# Patient Record
Sex: Male | Born: 1985 | Race: Black or African American | Hispanic: No | Marital: Single | Smoking: Current every day smoker
Health system: Southern US, Community
[De-identification: ages and names within clinical notes are randomized; demographics above are authoritative.]

## PROBLEM LIST (undated history)

## (undated) DIAGNOSIS — J45909 Unspecified asthma, uncomplicated: Secondary | ICD-10-CM

## (undated) DIAGNOSIS — R569 Unspecified convulsions: Secondary | ICD-10-CM

## (undated) DIAGNOSIS — F819 Developmental disorder of scholastic skills, unspecified: Secondary | ICD-10-CM

---

## 2004-04-08 ENCOUNTER — Emergency Department: Payer: Self-pay | Admitting: General Practice

## 2004-06-18 ENCOUNTER — Emergency Department: Payer: Self-pay | Admitting: Emergency Medicine

## 2004-10-05 ENCOUNTER — Emergency Department: Payer: Self-pay | Admitting: General Practice

## 2004-12-28 ENCOUNTER — Emergency Department: Payer: Self-pay | Admitting: Emergency Medicine

## 2005-02-04 ENCOUNTER — Emergency Department: Payer: Self-pay | Admitting: Emergency Medicine

## 2005-05-16 ENCOUNTER — Emergency Department: Payer: Self-pay | Admitting: Internal Medicine

## 2005-05-17 ENCOUNTER — Emergency Department: Payer: Self-pay | Admitting: Internal Medicine

## 2005-05-22 ENCOUNTER — Emergency Department: Payer: Self-pay | Admitting: Emergency Medicine

## 2005-06-23 ENCOUNTER — Emergency Department: Payer: Self-pay | Admitting: Emergency Medicine

## 2005-07-26 ENCOUNTER — Emergency Department: Payer: Self-pay | Admitting: Internal Medicine

## 2005-09-12 ENCOUNTER — Emergency Department: Payer: Self-pay | Admitting: Emergency Medicine

## 2005-11-02 ENCOUNTER — Emergency Department: Payer: Self-pay | Admitting: Emergency Medicine

## 2006-03-02 ENCOUNTER — Emergency Department: Payer: Self-pay | Admitting: General Practice

## 2006-08-13 ENCOUNTER — Emergency Department: Payer: Self-pay

## 2006-09-15 ENCOUNTER — Emergency Department: Payer: Self-pay | Admitting: General Practice

## 2006-12-23 ENCOUNTER — Emergency Department: Payer: Self-pay | Admitting: Emergency Medicine

## 2007-03-05 ENCOUNTER — Emergency Department: Payer: Self-pay | Admitting: Unknown Physician Specialty

## 2007-07-13 ENCOUNTER — Emergency Department: Payer: Self-pay | Admitting: Emergency Medicine

## 2007-09-22 ENCOUNTER — Emergency Department: Payer: Self-pay | Admitting: Emergency Medicine

## 2007-09-30 ENCOUNTER — Emergency Department: Payer: Self-pay | Admitting: Emergency Medicine

## 2007-11-10 ENCOUNTER — Emergency Department: Payer: Self-pay | Admitting: Emergency Medicine

## 2007-12-07 ENCOUNTER — Emergency Department: Payer: Self-pay | Admitting: Unknown Physician Specialty

## 2008-03-04 ENCOUNTER — Emergency Department: Payer: Self-pay | Admitting: Emergency Medicine

## 2008-04-01 ENCOUNTER — Emergency Department: Payer: Self-pay | Admitting: Internal Medicine

## 2008-04-25 ENCOUNTER — Emergency Department: Payer: Self-pay | Admitting: Emergency Medicine

## 2008-05-31 ENCOUNTER — Emergency Department: Payer: Self-pay | Admitting: Unknown Physician Specialty

## 2008-10-21 ENCOUNTER — Emergency Department: Payer: Self-pay | Admitting: Unknown Physician Specialty

## 2008-10-31 ENCOUNTER — Emergency Department: Payer: Self-pay | Admitting: Emergency Medicine

## 2008-11-03 ENCOUNTER — Emergency Department: Payer: Self-pay | Admitting: Emergency Medicine

## 2008-11-22 ENCOUNTER — Emergency Department: Payer: Self-pay | Admitting: Emergency Medicine

## 2009-01-06 ENCOUNTER — Emergency Department: Payer: Self-pay | Admitting: Emergency Medicine

## 2009-02-02 ENCOUNTER — Emergency Department: Payer: Self-pay | Admitting: Emergency Medicine

## 2009-03-05 ENCOUNTER — Emergency Department: Payer: Self-pay | Admitting: Unknown Physician Specialty

## 2009-04-05 ENCOUNTER — Emergency Department: Payer: Self-pay | Admitting: Emergency Medicine

## 2009-04-21 ENCOUNTER — Emergency Department: Payer: Self-pay | Admitting: Emergency Medicine

## 2009-05-01 ENCOUNTER — Emergency Department: Payer: Self-pay | Admitting: Emergency Medicine

## 2009-05-19 ENCOUNTER — Emergency Department: Payer: Self-pay | Admitting: Emergency Medicine

## 2009-06-21 ENCOUNTER — Emergency Department: Payer: Self-pay | Admitting: Emergency Medicine

## 2009-06-22 ENCOUNTER — Emergency Department: Payer: Self-pay | Admitting: Unknown Physician Specialty

## 2009-07-18 ENCOUNTER — Emergency Department: Payer: Self-pay | Admitting: Emergency Medicine

## 2009-08-31 ENCOUNTER — Emergency Department: Payer: Self-pay | Admitting: Emergency Medicine

## 2009-09-04 ENCOUNTER — Inpatient Hospital Stay: Payer: Self-pay | Admitting: Psychiatry

## 2009-10-24 ENCOUNTER — Emergency Department: Payer: Self-pay | Admitting: Emergency Medicine

## 2009-12-12 ENCOUNTER — Emergency Department: Payer: Self-pay | Admitting: Emergency Medicine

## 2010-02-10 ENCOUNTER — Emergency Department: Payer: Self-pay | Admitting: Unknown Physician Specialty

## 2010-05-01 ENCOUNTER — Emergency Department: Payer: Self-pay | Admitting: Unknown Physician Specialty

## 2010-05-07 ENCOUNTER — Emergency Department: Payer: Self-pay | Admitting: Internal Medicine

## 2010-05-16 ENCOUNTER — Emergency Department: Payer: Self-pay | Admitting: Emergency Medicine

## 2010-05-29 ENCOUNTER — Emergency Department: Payer: Self-pay | Admitting: Emergency Medicine

## 2010-06-07 ENCOUNTER — Emergency Department: Payer: Self-pay | Admitting: Emergency Medicine

## 2010-09-28 ENCOUNTER — Emergency Department: Payer: Self-pay | Admitting: Emergency Medicine

## 2010-11-27 ENCOUNTER — Emergency Department: Payer: Self-pay | Admitting: Emergency Medicine

## 2011-03-12 IMAGING — CT CT HEAD WITHOUT CONTRAST
2 of 5 series · 16 of 30 positions shown, 19 images · non-contrast
Comparison: none

REASON FOR EXAM: seizure  hit head
COMMENTS:

PROCEDURE:     CT  - CT HEAD WITHOUT CONTRAST  - May 29, 2010  [DATE]
RESULT:     Comparison:  None
TECHNIQUE: Multiple axial images from the foramen magnum to the vertex were
obtained without IV contrast.

[Series 2: without · axial · non-contrast · 0.38mm/px · z∈[+154,+274]mm · 9 of 30 slices shown, 12 images]
[im 3/30  brain]
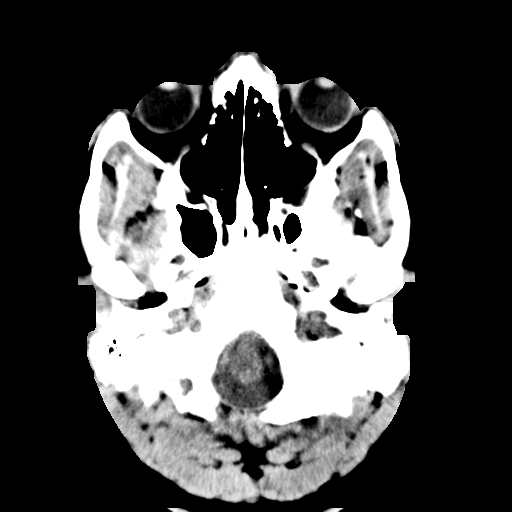
[im 3/30  bone]
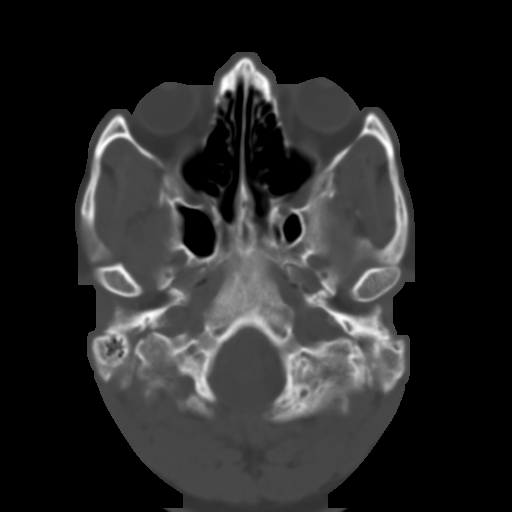
[im 6/30  brain]
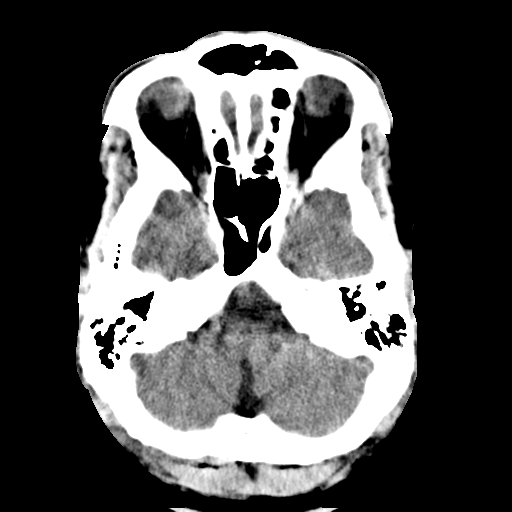
[im 9/30  brain]
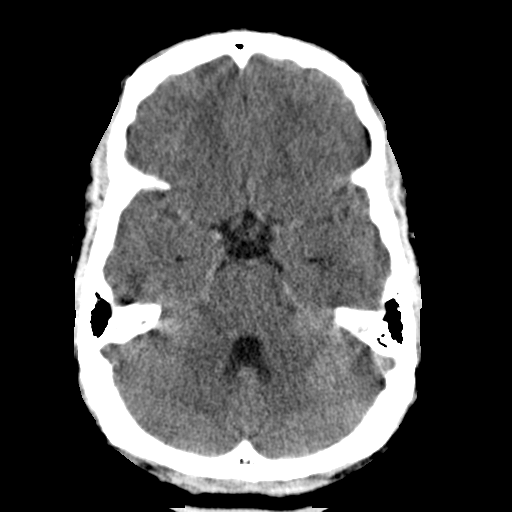
[im 12/30  brain]
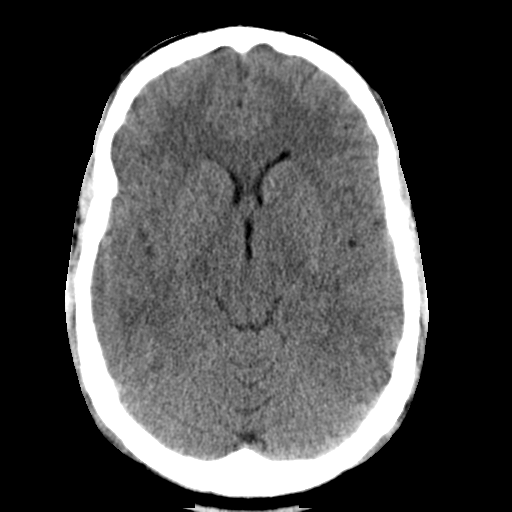
[im 15/30  brain]
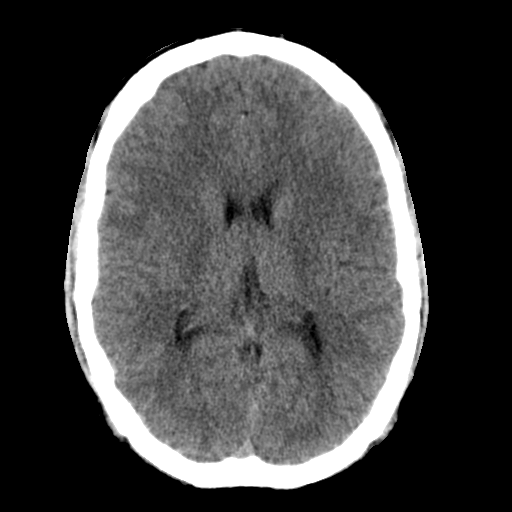
[im 15/30  bone]
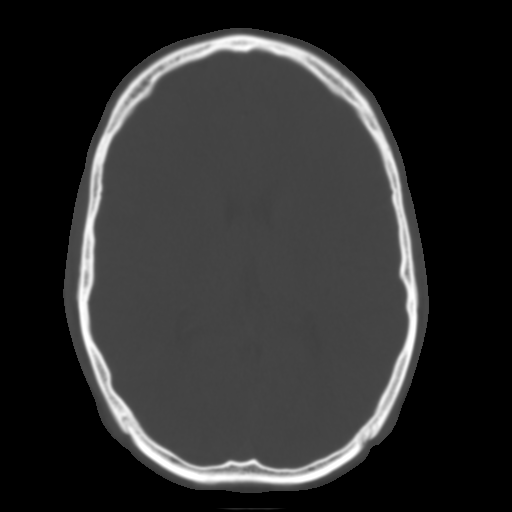
[im 18/30  brain]
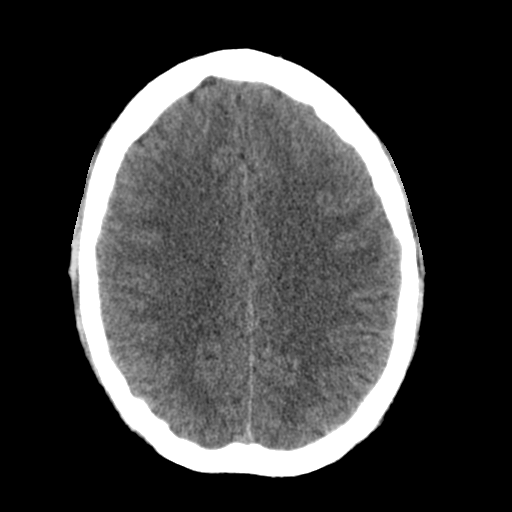
[im 21/30  brain]
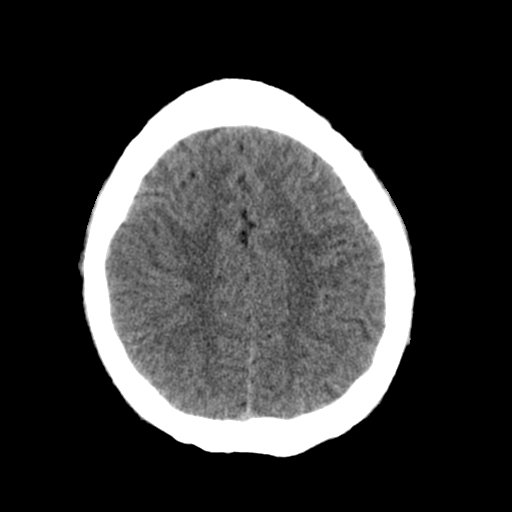
[im 24/30  brain]
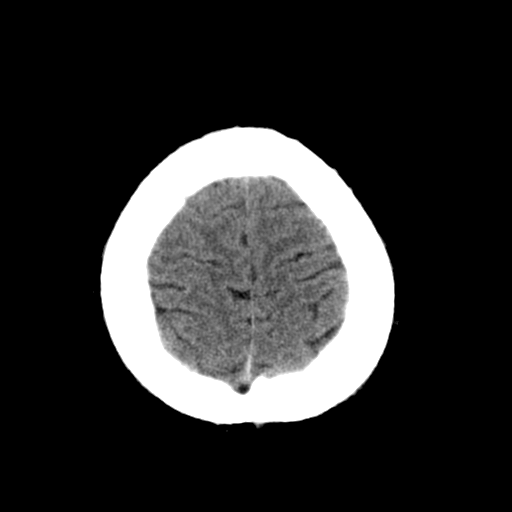
[im 27/30  brain]
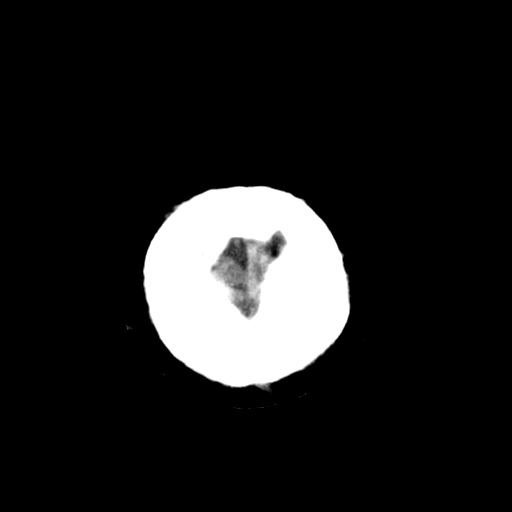
[im 27/30  bone]
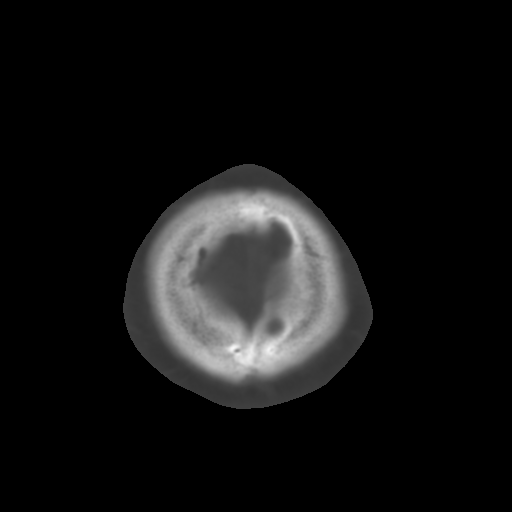

[Series 3: bone · axial · 0.38mm/px · z∈[+160,+254]mm · 7 of 30 slices shown]
[im 4/30  bone]
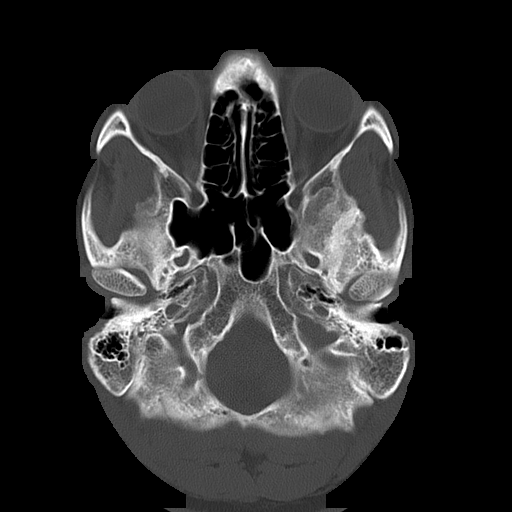
[im 7/30  bone]
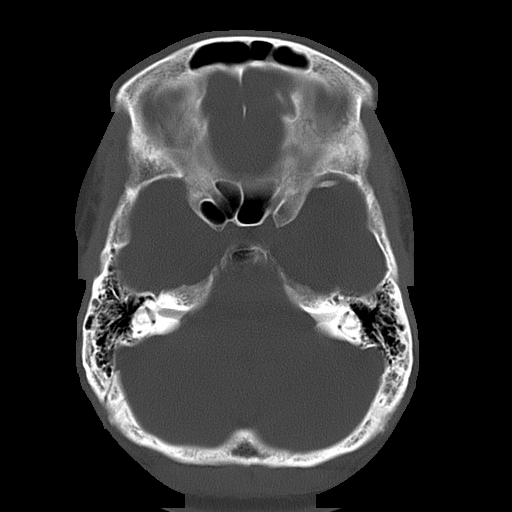
[im 10/30  bone]
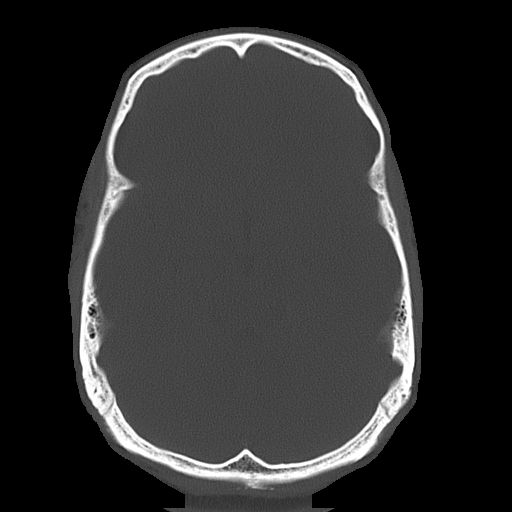
[im 13/30  bone]
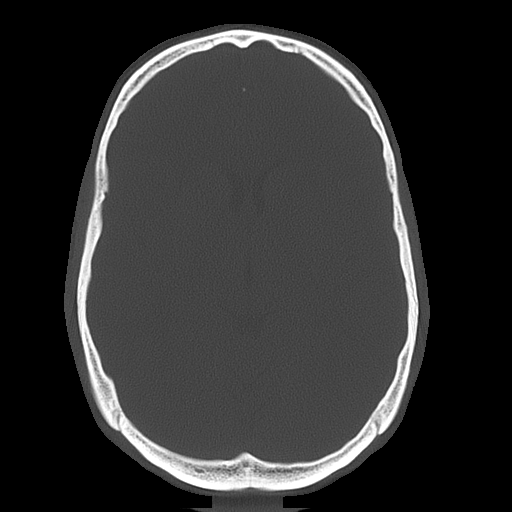
[im 17/30  bone]
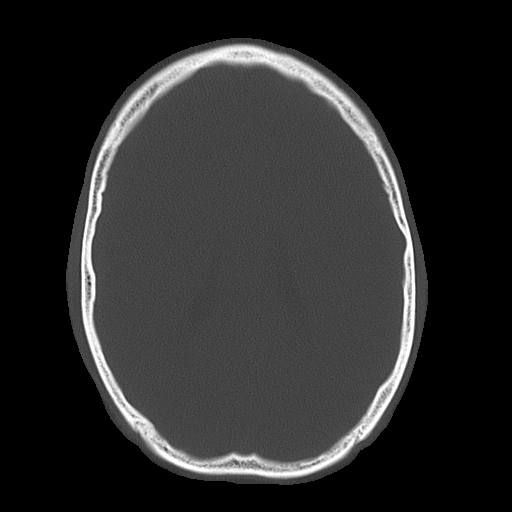
[im 20/30  bone]
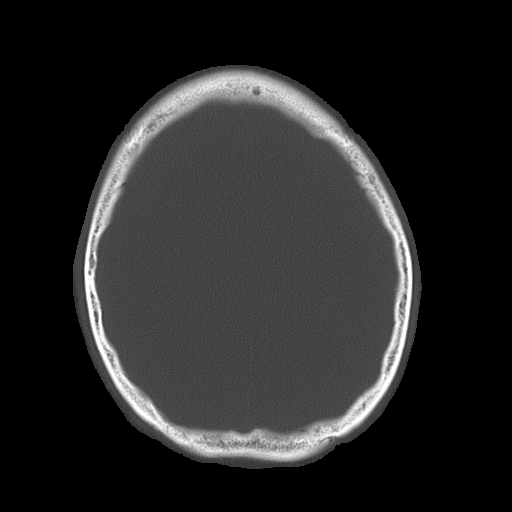
[im 23/30  bone]
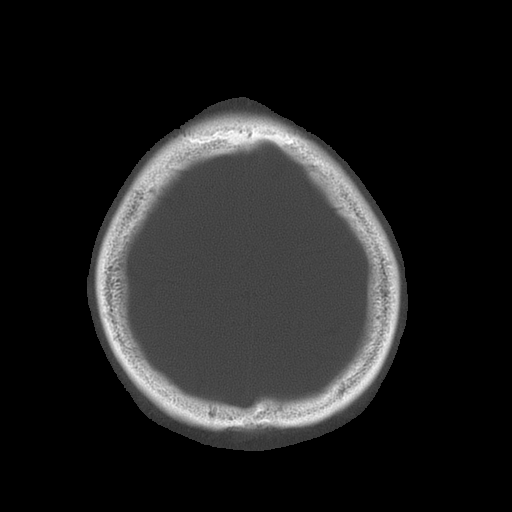

[16 of 30 positions shown; findings below may reference images not displayed]

FINDINGS: There is no evidence of mass effect, midline shift, or extra-axial fluid
collections.  There is no evidence of a space-occupying lesion or
intracranial hemorrhage. There is no evidence of a cortical-based area of
acute infarction.

The ventricles and sulci are appropriate for the patient's age. The basal
cisterns are patent.

Visualized portions of the orbits are unremarkable. The visualized portions
of the paranasal sinuses and mastoid air cells are unremarkable.

The osseous structures are unremarkable.
IMPRESSION: No acute intracranial process.

## 2011-10-21 ENCOUNTER — Emergency Department: Payer: Self-pay | Admitting: Internal Medicine

## 2011-11-01 ENCOUNTER — Emergency Department: Payer: Self-pay | Admitting: Unknown Physician Specialty

## 2011-11-01 LAB — BASIC METABOLIC PANEL
BUN: 9 mg/dL (ref 7–18)
Creatinine: 1.3 mg/dL (ref 0.60–1.30)
EGFR (Non-African Amer.): >60
Glucose: 78 mg/dL (ref 65–99)
Osmolality: 281 (ref 275–301)
Potassium: 3.5 mmol/L (ref 3.5–5.1)
Sodium: 142 mmol/L (ref 136–145)

## 2011-11-01 LAB — CBC WITH DIFFERENTIAL/PLATELET
Basophil %: 0.7 %
Eosinophil %: 0.5 %
HGB: 12.9 g/dL — ABNORMAL LOW (ref 13.0–18.0)
Lymphocyte #: 2.2 10*3/uL (ref 1.0–3.6)
Lymphocyte %: 33.4 %
MCH: 30.9 pg (ref 26.0–34.0)
MCV: 95 fL (ref 80–100)
Monocyte #: 0.5 x10 3/mm (ref 0.2–1.0)
Neutrophil %: 57.3 %
RBC: 4.19 10*6/uL — ABNORMAL LOW (ref 4.40–5.90)

## 2012-03-12 ENCOUNTER — Emergency Department: Payer: Self-pay

## 2012-03-12 LAB — URINALYSIS, COMPLETE
Blood: NEGATIVE
Glucose,UR: NEGATIVE mg/dL (ref 0–75)
Leukocyte Esterase: NEGATIVE
Nitrite: NEGATIVE
Ph: 7 (ref 4.5–8.0)
Protein: NEGATIVE
Specific Gravity: 1.013 (ref 1.003–1.030)

## 2012-03-12 LAB — CBC
HCT: 38.9 % — ABNORMAL LOW (ref 40.0–52.0)
HGB: 13 g/dL (ref 13.0–18.0)
MCH: 31.2 pg (ref 26.0–34.0)
MCHC: 33.4 g/dL (ref 32.0–36.0)
MCV: 94 fL (ref 80–100)
Platelet: 216 10*3/uL (ref 150–440)
RBC: 4.15 10*6/uL — ABNORMAL LOW (ref 4.40–5.90)

## 2012-03-12 LAB — BASIC METABOLIC PANEL
BUN: 6 mg/dL — ABNORMAL LOW (ref 7–18)
Calcium, Total: 8.8 mg/dL (ref 8.5–10.1)
Co2: 30 mmol/L (ref 21–32)
EGFR (African American): >60
Osmolality: 277 (ref 275–301)
Sodium: 140 mmol/L (ref 136–145)

## 2012-03-13 LAB — DRUG SCREEN, URINE
Amphetamines, Ur Screen: NEGATIVE (ref ?–1000)
Barbiturates, Ur Screen: NEGATIVE (ref ?–200)
MDMA (Ecstasy)Ur Screen: NEGATIVE (ref ?–500)
Methadone, Ur Screen: NEGATIVE (ref ?–300)
Opiate, Ur Screen: NEGATIVE (ref ?–300)
Tricyclic, Ur Screen: NEGATIVE (ref ?–1000)

## 2012-09-14 ENCOUNTER — Emergency Department: Payer: Self-pay | Admitting: Emergency Medicine

## 2013-04-20 ENCOUNTER — Emergency Department: Payer: Self-pay | Admitting: Emergency Medicine

## 2013-04-20 LAB — RAPID INFLUENZA A&B ANTIGENS (ARMC ONLY)

## 2013-06-14 ENCOUNTER — Emergency Department: Payer: Self-pay | Admitting: Emergency Medicine

## 2013-09-27 ENCOUNTER — Emergency Department: Payer: Self-pay | Admitting: Emergency Medicine

## 2013-09-27 LAB — URINALYSIS, COMPLETE
BILIRUBIN, UR: NEGATIVE
BLOOD: NEGATIVE
Bacteria: NONE SEEN
Glucose,UR: NEGATIVE mg/dL (ref 0–75)
KETONE: NEGATIVE
LEUKOCYTE ESTERASE: NEGATIVE
Nitrite: NEGATIVE
Ph: 5 (ref 4.5–8.0)
Protein: NEGATIVE
SPECIFIC GRAVITY: 1.028 (ref 1.003–1.030)
WBC UR: 1 /HPF (ref 0–5)

## 2013-10-07 ENCOUNTER — Emergency Department: Payer: Self-pay | Admitting: Emergency Medicine

## 2013-12-24 ENCOUNTER — Emergency Department: Payer: Self-pay | Admitting: Student

## 2013-12-24 LAB — URINALYSIS, COMPLETE
Bacteria: NONE SEEN
Bilirubin,UR: NEGATIVE
Blood: NEGATIVE
Glucose,UR: >=500 mg/dL (ref 0–75)
Ketone: NEGATIVE
LEUKOCYTE ESTERASE: NEGATIVE
Nitrite: NEGATIVE
PH: 7 (ref 4.5–8.0)
PROTEIN: NEGATIVE
RBC,UR: NONE SEEN /HPF (ref 0–5)
SQUAMOUS EPITHELIAL: NONE SEEN
Specific Gravity: 1.014 (ref 1.003–1.030)
WBC UR: <1 /HPF (ref 0–5)

## 2013-12-24 LAB — CBC
HCT: 41.7 % (ref 40.0–52.0)
HGB: 13.3 g/dL (ref 13.0–18.0)
MCH: 30.3 pg (ref 26.0–34.0)
MCHC: 31.9 g/dL — AB (ref 32.0–36.0)
MCV: 95 fL (ref 80–100)
PLATELETS: 242 10*3/uL (ref 150–440)
RBC: 4.39 10*6/uL — ABNORMAL LOW (ref 4.40–5.90)
RDW: 12.8 % (ref 11.5–14.5)
WBC: 5.2 10*3/uL (ref 3.8–10.6)

## 2013-12-24 LAB — COMPREHENSIVE METABOLIC PANEL
ALK PHOS: 54 U/L
ALT: 15 U/L
Albumin: 3.9 g/dL (ref 3.4–5.0)
Anion Gap: 1 — ABNORMAL LOW (ref 7–16)
BILIRUBIN TOTAL: 1.2 mg/dL — AB (ref 0.2–1.0)
BUN: 9 mg/dL (ref 7–18)
CALCIUM: 9.8 mg/dL (ref 8.5–10.1)
CREATININE: 1.42 mg/dL — AB (ref 0.60–1.30)
Chloride: 106 mmol/L (ref 98–107)
Co2: 33 mmol/L — ABNORMAL HIGH (ref 21–32)
EGFR (African American): >60
EGFR (Non-African Amer.): >60
Glucose: 54 mg/dL — ABNORMAL LOW (ref 65–99)
Osmolality: 276 (ref 275–301)
POTASSIUM: 4 mmol/L (ref 3.5–5.1)
SGOT(AST): 14 U/L — ABNORMAL LOW (ref 15–37)
Sodium: 140 mmol/L (ref 136–145)
Total Protein: 7.4 g/dL (ref 6.4–8.2)

## 2013-12-24 LAB — CK: CK, TOTAL: 155 U/L

## 2015-02-19 ENCOUNTER — Emergency Department
Admission: EM | Admit: 2015-02-19 | Discharge: 2015-02-19 | Disposition: A | Discharge status: Home / Self Care | Payer: Self-pay | Attending: Emergency Medicine | Admitting: Emergency Medicine

## 2015-02-19 ENCOUNTER — Emergency Department: Payer: Self-pay

## 2015-02-19 DIAGNOSIS — Y92322 Soccer field as the place of occurrence of the external cause: Secondary | ICD-10-CM | POA: Insufficient documentation

## 2015-02-19 DIAGNOSIS — Y9366 Activity, soccer: Secondary | ICD-10-CM | POA: Insufficient documentation

## 2015-02-19 DIAGNOSIS — S93402A Sprain of unspecified ligament of left ankle, initial encounter: Secondary | ICD-10-CM | POA: Insufficient documentation

## 2015-02-19 DIAGNOSIS — Y998 Other external cause status: Secondary | ICD-10-CM | POA: Insufficient documentation

## 2015-02-19 DIAGNOSIS — X58XXXA Exposure to other specified factors, initial encounter: Secondary | ICD-10-CM | POA: Insufficient documentation

## 2015-02-19 MED ORDER — MELOXICAM 15 MG PO TABS: 15.0000 mg | ORAL_TABLET | Freq: Every day | ORAL | Status: DC | PRN

## 2015-02-19 NOTE — ED Provider Notes (Signed)
[facility] Emergency Department Provider Note  ____________________________________________  Time seen: Approximately 2:45 PM  I have reviewed the triage vital signs and the nursing notes.   HISTORY  Chief Complaint Ankle Pain   HPI Martin Cooper is a 29 y.o. male presents for the complaints of left ankle pain. Reports that he has had ankle pain for 3 days. Patient states earlier this week he was playing soccer and is concerned that he hurt his ankle while playing soccer. States no pain at rest but pain when walking. States current ankle pain is 6 out of 10 and states that it hurts to walk on ankle. Denies other pain or injury. Denies head injury or loss of consciousness. Denies neck or back pain. Reports continues to ambulate but with pain. Denies numbness or tingling sensation. Denies pain in left foot or pain above left ankle. States that he did injure the same ankle approximately 2 months ago and states that it had gotten better.    No past medical history on file.  There are no active problems to display for this patient.   No past surgical history on file.  Current Outpatient Rx  Name  Route  Sig  Dispense  Refill               Allergies Review of patient's allergies indicates not on file.  No family history on file.  Social History Social History  Substance Use Topics  . Smoking status: Not on file  . Smokeless tobacco: Not on file  . Alcohol Use: Not on file    Review of Systems Constitutional: No fever/chills Eyes: No visual changes. ENT: No sore throat. Cardiovascular: Denies chest pain. Respiratory: Denies shortness of breath. Gastrointestinal: No abdominal pain.  No nausea, no vomiting.  No diarrhea.  No constipation. Genitourinary: Negative for dysuria. Musculoskeletal: Negative for back pain. left ankle pain  Skin: Negative for rash. Neurological: Negative for headaches, focal weakness or numbness.  10-point ROS otherwise  negative.  ____________________________________________   PHYSICAL EXAM:  VITAL SIGNS: ED Triage Vitals  Enc Vitals Group     BP 02/19/15 1334 116/64 mmHg     Pulse Rate 02/19/15 1334 66     Resp 02/19/15 1334 16     Temp 02/19/15 1334 98.1 F (36.7 C)     Temp Source 02/19/15 1334 Oral     SpO2 02/19/15 1334 98 %     Weight 02/19/15 1334 140 lb (63.504 kg)     Height 02/19/15 1334 5\' 5"  (1.651 m)     Head Cir --      Peak Flow --      Pain Score 02/19/15 1343 10     Pain Loc --      Pain Edu? --      Excl. in GC? --     Constitutional: Alert and oriented. Well appearing and in no acute distress. Eyes: Conjunctivae are normal. PERRL. EOMI. Head: Atraumatic.  Ears: no erythema, normal TMs bilaterally.   Nose: No congestion/rhinnorhea.  Mouth/Throat: Mucous membranes are moist.  Oropharynx non-erythematous. Neck: No stridor.  No cervical spine tenderness to palpation. Hematological/Lymphatic/Immunilogical: No cervical lymphadenopathy. Cardiovascular: Normal rate, regular rhythm. Grossly normal heart sounds.  Good peripheral circulation. Respiratory: Normal respiratory effort.  No retractions. Lungs CTAB. Gastrointestinal: Soft and nontender. No distention. Normal Bowel sounds.   Musculoskeletal: No lower or upper extremity tenderness nor edema.  No joint effusions. Bilateral pedal pulses equal and easily palpated.  no cervical, thoracic  or lumbar tenderness to palpation.  Except: Left medial to anterior ankle mild to moderate tenderness to palpation, minimal swelling, no ecchymosis, no erythema, skin intact, full range of motion, pain with rotation, mild pain with plantar and dorsiflexion. Gait not tested due to pain. No signs of infection. Neurologic:  Normal speech and language. No gross focal neurologic deficits are appreciated.  Skin:  Skin is warm, dry and intact. No rash noted. Psychiatric: Mood and affect are normal. Speech and behavior are  normal.  ____________________________________________   LABS (all labs ordered are listed, but only abnormal results are displayed)  Labs Reviewed - No data to display ____________________________________________  RADIOLOGY  EXAM: LEFT ANKLE COMPLETE - 3+ VIEW  COMPARISON: 09/14/2012  FINDINGS: There is no evidence of fracture, dislocation, or joint effusion. There is no evidence of arthropathy or other focal bone abnormality. Soft tissues are unremarkable.  IMPRESSION: Negative.  Signed,  Yvone Neu. Loreta Ave, DO  [facility]  [facility]   Electronically Signed By: Gilmer Mor D.O. On: 02/19/2015 14:42  I, Renford Dills, personally viewed and evaluated these images (plain radiographs) as part of my medical decision making.   ____________________________________________   PROCEDURES  Procedure(s) performed:   left ankle Velcro splint applied by RN crutches given. Neurovascular intact post application.  ____________________________________________   INITIAL IMPRESSION / ASSESSMENT AND PLAN / ED COURSE  Pertinent labs & imaging results that were available during my care of the patient were reviewed by me and considered in my medical decision making (see chart for details).  Very well-appearing patient. No acute distress. Presents for 3 days of left medial anterior ankle pain. Pain fully reproducible on palpation. Minimal swelling, no ecchymosis, skin intact, no erythema. Left ankle x-ray negative. Suspect strain injury. Will treat supportively with daily moving, crutches, splint and rest. Discussed follow-up with primary care physician as needed.Discussed follow up with Primary care physician this week. Discussed follow up and return parameters including no resolution or any worsening concerns. Patient verbalized understanding and agreed to plan.   ____________________________________________   FINAL  CLINICAL IMPRESSION(S) / ED DIAGNOSES  Final diagnoses:  Left ankle sprain, initial encounter       Renford Dills, NP 02/19/15 1633  Renford Dills, NP 02/19/15 1634  Myrna Blazer, MD 02/19/15 [phone]

## 2015-02-19 NOTE — ED Notes (Addendum)
Pt to ED c/o L ankle swelling and pain 10/10 x 2-3 weeks. Pt states difficulty putting weight on extremity and limited ROM. Denies injury.

## 2015-02-19 NOTE — Discharge Instructions (Signed)
Take medication as prescribed. Apply ice. Elevate. Wear splint and use crutches as long as pain continues then gradually apply weight. Follow up with your primary care physician or orthopedic above as needed for continued pain. Return to the emergency room as needed for new or worsening concerns.  Ankle Sprain An ankle sprain is an injury to the strong, fibrous tissues (ligaments) that hold the bones of your ankle joint together.  CAUSES An ankle sprain is usually caused by a fall or by twisting your ankle. Ankle sprains most commonly occur when you step on the outer edge of your foot, and your ankle turns inward. People who participate in sports are more prone to these types of injuries.  SYMPTOMS   Pain in your ankle. The pain may be present at rest or only when you are trying to stand or walk.  Swelling.  Bruising. Bruising may develop immediately or within 1 to 2 days after your injury.  Difficulty standing or walking, particularly when turning corners or changing directions. DIAGNOSIS  Your caregiver will ask you details about your injury and perform a physical exam of your ankle to determine if you have an ankle sprain. During the physical exam, your caregiver will press on and apply pressure to specific areas of your foot and ankle. Your caregiver will try to move your ankle in certain ways. An X-ray exam may be done to be sure a bone was not broken or a ligament did not separate from one of the bones in your ankle (avulsion fracture).  TREATMENT  Certain types of braces can help stabilize your ankle. Your caregiver can make a recommendation for this. Your caregiver may recommend the use of medicine for pain. If your sprain is severe, your caregiver may refer you to a surgeon who helps to restore function to parts of your skeletal system (orthopedist) or a physical therapist. HOME CARE INSTRUCTIONS   Apply ice to your injury for 1-2 days or as directed by your caregiver. Applying ice  helps to reduce inflammation and pain.  Put ice in a plastic bag.  Place a towel between your skin and the bag.  Leave the ice on for 15-20 minutes at a time, every 2 hours while you are awake.  Only take over-the-counter or prescription medicines for pain, discomfort, or fever as directed by your caregiver.  Elevate your injured ankle above the level of your heart as much as possible for 2-3 days.  If your caregiver recommends crutches, use them as instructed. Gradually put weight on the affected ankle. Continue to use crutches or a cane until you can walk without feeling pain in your ankle.  If you have a plaster splint, wear the splint as directed by your caregiver. Do not rest it on anything harder than a pillow for the first 24 hours. Do not put weight on it. Do not get it wet. You may take it off to take a shower or bath.  You may have been given an elastic bandage to wear around your ankle to provide support. If the elastic bandage is too tight (you have numbness or tingling in your foot or your foot becomes cold and blue), adjust the bandage to make it comfortable.  If you have an air splint, you may blow more air into it or let air out to make it more comfortable. You may take your splint off at night and before taking a shower or bath. Wiggle your toes in the splint several times per day  to decrease swelling. SEEK MEDICAL CARE IF:   You have rapidly increasing bruising or swelling.  Your toes feel extremely cold or you lose feeling in your foot.  Your pain is not relieved with medicine. SEEK IMMEDIATE MEDICAL CARE IF:  Your toes are numb or blue.  You have severe pain that is increasing. MAKE SURE YOU:   Understand these instructions.  Will watch your condition.  Will get help right away if you are not doing well or get worse.   This information is not intended to replace advice given to you by your health care provider. Make sure you discuss any questions you have  with your health care provider.   Document Released: 04/02/2005 Document Revised: 04/23/2014 Document Reviewed: 04/14/2011 Elsevier Interactive Patient Education [employer]2016 [employer]

## 2015-11-18 ENCOUNTER — Encounter: Payer: Self-pay | Admitting: *Deleted

## 2015-11-18 ENCOUNTER — Emergency Department: Payer: Self-pay

## 2015-11-18 ENCOUNTER — Emergency Department
Admission: EM | Admit: 2015-11-18 | Discharge: 2015-11-18 | Disposition: A | Discharge status: Home / Self Care | Payer: Self-pay | Attending: Student in an Organized Health Care Education/Training Program | Admitting: Student in an Organized Health Care Education/Training Program

## 2015-11-18 DIAGNOSIS — Y939 Activity, unspecified: Secondary | ICD-10-CM | POA: Insufficient documentation

## 2015-11-18 DIAGNOSIS — J45909 Unspecified asthma, uncomplicated: Secondary | ICD-10-CM | POA: Insufficient documentation

## 2015-11-18 DIAGNOSIS — W1839XA Other fall on same level, initial encounter: Secondary | ICD-10-CM | POA: Insufficient documentation

## 2015-11-18 DIAGNOSIS — Y929 Unspecified place or not applicable: Secondary | ICD-10-CM | POA: Insufficient documentation

## 2015-11-18 DIAGNOSIS — S5001XA Contusion of right elbow, initial encounter: Secondary | ICD-10-CM | POA: Insufficient documentation

## 2015-11-18 DIAGNOSIS — Y999 Unspecified external cause status: Secondary | ICD-10-CM | POA: Insufficient documentation

## 2015-11-18 HISTORY — DX: Unspecified asthma, uncomplicated: J45.909

## 2015-11-18 HISTORY — DX: Unspecified convulsions: R56.9

## 2015-11-18 MED ORDER — IBUPROFEN 800 MG PO TABS: 800.0000 mg | ORAL_TABLET | Freq: Three times a day (TID) | ORAL | 0 refills | Status: DC | PRN

## 2015-11-18 NOTE — ED Triage Notes (Signed)
Patient states that during a seizure he fell on his right elbow. Right elbow is swollen, has limited movement. Patient and girlfriend states that patient has seizures when he is upset about something. Patient states he has been compliant with taking the seizure medications.

## 2015-11-18 NOTE — ED Notes (Signed)
Discharge instructions reviewed with patient. Patient verbalized understanding. Patient ambulated to lobby without difficulty.   

## 2015-11-18 NOTE — ED Provider Notes (Signed)
[facility] Emergency Department Provider Note   ____________________________________________   None    (approximate)  I have reviewed the triage vital signs and the nursing notes.   HISTORY  Chief Complaint No chief complaint on file.    HPI Martin Cooper is a 30 y.o. male who presents to the ED for evaluation of right elbow pain.Patient states that he had a seizure last night and fell directly onto his right elbow on hardwood floors. Patient states that he is able to straighten and bend his elbow but with extreme pain. He has not taken any medications for his pain. He states that he has been icing the affected area, but with increased swelling. He denies numbness or tingling to extremities. He denies hitting his head during the fall.  Patient states that his seizures have been increasing in frequency and duration. They are triggered by an emotional response. He states that he is taking his medications as prescribed. This last seizure lasted one hour. No past medical history on file.  There are no active problems to display for this patient.   No past surgical history on file.  Prior to Admission medications   Medication Sig Start Date End Date Taking? Authorizing Provider  meloxicam (MOBIC) 15 MG tablet Take 1 tablet (15 mg total) by mouth daily as needed for pain. 02/19/15   Renford Dills, NP    Allergies Review of patient's allergies indicates not on file.  No family history on file.  Social History Social History  Substance Use Topics  . Smoking status: Not on file  . Smokeless tobacco: Not on file  . Alcohol use Not on file    Review of Systems Constitutional: No fever/chills Cardiovascular: Denies chest pain. Respiratory: Denies shortness of breath. Musculoskeletal: Negative for back pain. Positive right elbow pain with rest and ambulation Skin: Negative for rash. Swelling and redness noted to the right elbow. Neurological:  Negative for headaches, focal weakness or numbness. 10-point ROS otherwise negative.  ____________________________________________   PHYSICAL EXAM:  VITAL SIGNS: ED Triage Vitals  Enc Vitals Group     BP      Pulse      Resp      Temp      Temp src      SpO2      Weight      Height      Head Circumference      Peak Flow      Pain Score      Pain Loc      Pain Edu?      Excl. in GC?     Constitutional: Alert and oriented. Well appearing and in no acute distress. Eyes: Conjunctivae are normal.  Head: Atraumatic. Neck: No stridor.   Cardiovascular: Normal rate, regular rhythm. Grossly normal heart sounds.  Good peripheral circulation. Respiratory: Normal respiratory effort.  No retractions. Lungs CTAB. Musculoskeletal: No lower extremity tenderness nor edema.  No joint effusions. Pain to palpation to the right medial and lateral epicondyles. Limited range of motion and strength testing secondary to pain.  Neurologic:  Normal speech and language. No gross focal neurologic deficits are appreciated. No gait instability. Skin:  Skin is warm, dry and intact. No rash noted. Psychiatric: Mood and affect are normal. Speech and behavior are normal.  ____________________________________________   LABS (all labs ordered are listed, but only abnormal results are displayed)  Labs Reviewed - No data to display ____________________________________________  EKG   ____________________________________________  RADIOLOGY  No osseous findings IMPRESSION: 1. No fracture, dislocation, or joint effusion. 2. Posterior soft tissue prominence may reflect contusion/hematoma in the setting of injury. ____________________________________________   PROCEDURES  Procedure(s) performed: None  Procedures  Critical Care performed: No  ____________________________________________   INITIAL IMPRESSION / ASSESSMENT AND PLAN / ED COURSE  Pertinent labs & imaging results that were  available during my care of the patient were reviewed by me and considered in my medical decision making (see chart for details).  No fractures or dislocations on x-ray. Patient has contusion to right elbow. He has been instructed to continue icing and resting affected area. He should take ibuprofen for pain. Patient should follow up with orthopedics if pain worsens. Patient has been instructed to follow up with PCP or neurologist for better control of seizures.  Clinical Course  Value Comment By Time  DG Elbow Complete Right (Reviewed) Evangeline Dakin, PA-C 08/04 1947     ____________________________________________   FINAL CLINICAL IMPRESSION(S) / ED DIAGNOSES  Final diagnoses:  None      NEW MEDICATIONS STARTED DURING THIS VISIT:  New Prescriptions   No medications on file     Note:  This document was prepared using Dragon voice recognition software and may include unintentional dictation errors.   Evangeline Dakin, PA-C 11/18/15 1948    Willy Eddy, MD 11/19/15 [phone]

## 2016-05-18 ENCOUNTER — Emergency Department: Payer: Medicaid Other

## 2016-05-18 ENCOUNTER — Emergency Department
Admission: EM | Admit: 2016-05-18 | Discharge: 2016-05-18 | Disposition: A | Discharge status: Home / Self Care | Payer: Medicaid Other | Attending: Emergency Medicine | Admitting: Emergency Medicine

## 2016-05-18 ENCOUNTER — Encounter: Payer: Self-pay | Admitting: Emergency Medicine

## 2016-05-18 DIAGNOSIS — Y929 Unspecified place or not applicable: Secondary | ICD-10-CM | POA: Diagnosis not present

## 2016-05-18 DIAGNOSIS — M79632 Pain in left forearm: Secondary | ICD-10-CM | POA: Diagnosis not present

## 2016-05-18 DIAGNOSIS — Y9367 Activity, basketball: Secondary | ICD-10-CM | POA: Diagnosis not present

## 2016-05-18 DIAGNOSIS — Y998 Other external cause status: Secondary | ICD-10-CM | POA: Diagnosis not present

## 2016-05-18 DIAGNOSIS — W19XXXA Unspecified fall, initial encounter: Secondary | ICD-10-CM

## 2016-05-18 DIAGNOSIS — S59912A Unspecified injury of left forearm, initial encounter: Secondary | ICD-10-CM | POA: Diagnosis present

## 2016-05-18 DIAGNOSIS — J45909 Unspecified asthma, uncomplicated: Secondary | ICD-10-CM | POA: Diagnosis not present

## 2016-05-18 DIAGNOSIS — Z79899 Other long term (current) drug therapy: Secondary | ICD-10-CM | POA: Insufficient documentation

## 2016-05-18 DIAGNOSIS — W1839XA Other fall on same level, initial encounter: Secondary | ICD-10-CM | POA: Insufficient documentation

## 2016-05-18 LAB — GLUCOSE, CAPILLARY: GLUCOSE-CAPILLARY: 119 mg/dL — AB (ref 65–99)

## 2016-05-18 MED ORDER — PREDNISONE 10 MG (21) PO TBPK: 10.0000 mg | ORAL_TABLET | Freq: Every day | ORAL | 0 refills | Status: DC

## 2016-05-18 NOTE — ED Triage Notes (Signed)
Pt states he fell during a basketball game two weeks ago and has had pain and tingling to left arm since then.

## 2016-05-18 NOTE — ED Provider Notes (Signed)
[name] Emergency Department Provider Note  ____________________________________________  Time seen: Approximately 8:27 PM  I have reviewed the triage vital signs and the nursing notes.   HISTORY  Chief Complaint Fall    HPI Martin Cooper is a 31 y.o. male presenting to the emergency department with 10 out of 10 aching and numb left forearm pain. Left forearm pain is worsened with range of motion at the neck.  Patient states that he fell 2 weeks ago while playing basketball.  Patient states that he has experienced non-improving radiculopathy since that time. He denies hitting his head or lose consciousness during the fall. Patient is left-handed. He has tried Ibuprofen but has not attempted other alleviating measures. He denies chest pain, chest tightness, shortness of breath, nausea and abdominal pain.  Past Medical History:  Diagnosis Date  . Asthma   . Seizures (HCC)     There are no active problems to display for this patient.   History reviewed. No pertinent surgical history.  Prior to Admission medications   Medication Sig Start Date End Date Taking? Authorizing Provider  ibuprofen (ADVIL,MOTRIN) 800 MG tablet Take 1 tablet (800 mg total) by mouth every 8 (eight) hours as needed. 11/18/15   Charmayne Sheer Beers, PA-C  meloxicam (MOBIC) 15 MG tablet Take 1 tablet (15 mg total) by mouth daily as needed for pain. 02/19/15   Renford Dills, NP  predniSONE (STERAPRED UNI-PAK 21 TAB) 10 MG (21) TBPK tablet Take 1 tablet (10 mg total) by mouth daily. Take 6 tablets the first day, take 5 tablets the second day, take 4 tablets the third day, take 3 tablets the fourth day, take 2 tablets the fifth day, take 1 tablet the sixth day. 05/18/16   Orvil Feil, PA-C    Allergies Patient has no known allergies.  History reviewed. No pertinent family history.  Social History Social History  Substance Use Topics  . Smoking status: Never Smoker  . Smokeless tobacco:  Never Used  . Alcohol use No     Review of Systems  Constitutional: No fever/chills Eyes: No visual changes. No discharge ENT: No upper respiratory complaints. Cardiovascular: no chest pain. Respiratory: no cough. No SOB. Gastrointestinal: No abdominal pain.  No nausea, no vomiting.  No diarrhea.  No constipation. Musculoskeletal: Patient has left forearm pain.  Skin: Negative for rash, abrasions, lacerations, ecchymosis. Neurological: Patient has left upper extremity radiculopathy.  ____________________________________________   PHYSICAL EXAM:  VITAL SIGNS: ED Triage Vitals  Enc Vitals Group     BP 05/18/16 1806 (!) 133/54     Pulse Rate 05/18/16 1806 (!) 59     Resp 05/18/16 1806 18     Temp 05/18/16 1806 98 F (36.7 C)     Temp Source 05/18/16 1806 Oral     SpO2 05/18/16 1806 100 %     Weight 05/18/16 1808 140 lb (63.5 kg)     Height 05/18/16 1808 5\' 11"  (1.803 m)     Head Circumference --      Peak Flow --      Pain Score 05/18/16 1808 10     Pain Loc --      Pain Edu? --      Excl. in GC? --     Constitutional: Alert and oriented. Patient is talkative and engaged.  Neck: Full range of motion. Left upper extremity radiculopathy is elicited with range of motion at the neck. No pain with palpation along the cervical spine.  Cardiovascular:  No scars of the skin overlying the anterior or posterior chest wall. No pain with palpation over the anterior and posterior chest wall. Normal rate, regular rhythm. Normal S1 and S2. No murmurs, gallops or rubs auscultated.  Respiratory: Trachea is midline. No retractions or presence of deformity. Thoracic expansion is symmetric with unaccentuated tactile fremitus. Resonant and symmetric percussion tones bilaterally. On auscultation, adventitious sounds are absent.  Musculoskeletal: To inspection, upper extremities appear symmetric. Patient has 5 out of 5 strength in the upper extremities bilaterally. Patient has full range of motion  at the shoulder, elbow and wrist bilaterally. Patient's left forearm is tender to palpation. Palpable radial and ulnar pulses bilaterally and symmetrically. Neurologic:  Normal for age. No gross focal neurologic deficits are appreciated. Reflexes are 2+ and symmetric in the upper extremities bilaterally.  Skin:  Skin overlying the left upper extremity is warm.  No erythema or edema of the skin overlying the left upper extremity. Psychiatric: Mood and affect are normal for age. Speech and behavior are normal.  ____________________________________________   LABS (all labs ordered are listed, but only abnormal results are displayed)  Labs Reviewed  GLUCOSE, CAPILLARY - Abnormal; Notable for the following:       Result Value   Glucose-Capillary 119 (*)    All other components within normal limits   ____________________________________________  EKG   ____________________________________________  RADIOLOGY Geraldo Pitter, personally viewed and evaluated these images (plain radiographs) as part of my medical decision making, as well as reviewing the written report by the radiologist.   Dg Cervical Spine Complete  Result Date: 05/18/2016 CLINICAL DATA:  Larey Seat 2 weeks ago during a basketball game. Neck and shoulder pain. EXAM: CERVICAL SPINE - COMPLETE 4+ VIEW COMPARISON:  None. FINDINGS: The cervical vertebral bodies are normally aligned. Disc spaces and vertebral bodies are maintained. No significant degenerative changes. No acute bony findings or abnormal prevertebral soft tissue swelling. The facets are normally aligned. The neural foramen are patent. The C1-2 articulations are maintained. The lung apices are clear. IMPRESSION: Normal cervical spine series. Electronically Signed   By: Rudie Meyer M.D.   On: 05/18/2016 20:57   Dg Forearm Left  Result Date: 05/18/2016 CLINICAL DATA:  Larey Seat 2 weeks ago and injured left forearm. EXAM: LEFT FOREARM - 2 VIEW COMPARISON:  01/06/2009. FINDINGS:  The wrist and elbow joints are maintained. No forearm fracture is identified. IMPRESSION: No acute bony findings. Electronically Signed   By: Rudie Meyer M.D.   On: 05/18/2016 20:58    ____________________________________________    PROCEDURES  Procedure(s) performed:    Procedures    Medications - No data to display   ____________________________________________   INITIAL IMPRESSION / ASSESSMENT AND PLAN / ED COURSE  Pertinent labs & imaging results that were available during my care of the patient were reviewed by me and considered in my medical decision making (see chart for details).  Review of the [place] CSRS was performed in accordance of the [facility] prior to dispensing any controlled drugs.     Assessment and Plan:  Fall:  Left upper extremity pain:  Patient presents the emergency department with left upper extremity pain after falling while playing basketball 2 weeks ago. On physical exam, patient's symptoms are reproduced with range of motion testing at the neck. Neurologic exam does not reveal weakness. Radial and ulnar pulses were palpated bilaterally and symmetrically. DG cervical spine and DG left forearm reveal no acute fractures, dislocations or bony abnormalities. Patient was discharged with a  tapered prednisone. Referral was made to the orthopedist on-call, Dr. Hyacinth MeekerMiller. All patient questions were answered. ____________________________________________  FINAL CLINICAL IMPRESSION(S) / ED DIAGNOSES  Final diagnoses:  Fall, initial encounter  Left forearm pain      NEW MEDICATIONS STARTED DURING THIS VISIT:  New Prescriptions   PREDNISONE (STERAPRED UNI-PAK 21 TAB) 10 MG (21) TBPK TABLET    Take 1 tablet (10 mg total) by mouth daily. Take 6 tablets the first day, take 5 tablets the second day, take 4 tablets the third day, take 3 tablets the fourth day, take 2 tablets the fifth day, take 1 tablet the sixth day.        This chart was dictated using voice  recognition software/Dragon. Despite best efforts to proofread, errors can occur which can change the meaning. Any change was purely unintentional.    Orvil FeilJaclyn M Eyoel Throgmorton, PA-C 05/18/16 2242    Emily FilbertJonathan E Williams, MD 05/18/16 [phone]

## 2016-12-05 ENCOUNTER — Emergency Department
Admission: EM | Admit: 2016-12-05 | Discharge: 2016-12-05 | Disposition: A | Discharge status: Home / Self Care | Payer: Medicaid Other | Attending: Emergency Medicine | Admitting: Emergency Medicine

## 2016-12-05 DIAGNOSIS — Y929 Unspecified place or not applicable: Secondary | ICD-10-CM | POA: Diagnosis not present

## 2016-12-05 DIAGNOSIS — T161XXA Foreign body in right ear, initial encounter: Secondary | ICD-10-CM | POA: Diagnosis present

## 2016-12-05 DIAGNOSIS — J45909 Unspecified asthma, uncomplicated: Secondary | ICD-10-CM | POA: Diagnosis not present

## 2016-12-05 DIAGNOSIS — F17210 Nicotine dependence, cigarettes, uncomplicated: Secondary | ICD-10-CM | POA: Insufficient documentation

## 2016-12-05 DIAGNOSIS — Y939 Activity, unspecified: Secondary | ICD-10-CM | POA: Diagnosis not present

## 2016-12-05 DIAGNOSIS — Y999 Unspecified external cause status: Secondary | ICD-10-CM | POA: Insufficient documentation

## 2016-12-05 DIAGNOSIS — Z79899 Other long term (current) drug therapy: Secondary | ICD-10-CM | POA: Insufficient documentation

## 2016-12-05 DIAGNOSIS — X58XXXA Exposure to other specified factors, initial encounter: Secondary | ICD-10-CM | POA: Diagnosis not present

## 2016-12-05 MED ORDER — LIDOCAINE VISCOUS 2 % MT SOLN: 15.0000 mL | Freq: Once | OROMUCOSAL | Status: DC

## 2016-12-05 MED ORDER — LIDOCAINE VISCOUS 2 % MT SOLN
OROMUCOSAL | Status: AC
  Filled 2016-12-05: qty 15

## 2016-12-05 NOTE — ED Provider Notes (Signed)
[facility] Emergency Department Provider Note  ____________________________________________   First MD Initiated Contact with Patient 12/05/16 [phone]     (approximate)  I have reviewed the triage vital signs and the nursing notes.   HISTORY  Chief Complaint Foreign Body in Ear   HPI Martin Cooper is a 31 y.o. male who is presenting to the emergency department today with the feeling of a bug in his right ear. He says that he was awoken from sleep with a sensation of scratching to his right ear. EMS was called and is brought immediately to the emergency department. Patient received viscous lidocaine to the right ear upon arrival to the emergency department.   Past Medical History:  Diagnosis Date  . Asthma   . Seizures (HCC)     There are no active problems to display for this patient.   History reviewed. No pertinent surgical history.  Prior to Admission medications   Medication Sig Start Date End Date Taking? Authorizing Provider  ibuprofen (ADVIL,MOTRIN) 800 MG tablet Take 1 tablet (800 mg total) by mouth every 8 (eight) hours as needed. 11/18/15   Beers, Charmayne Sheer, PA-C  meloxicam (MOBIC) 15 MG tablet Take 1 tablet (15 mg total) by mouth daily as needed for pain. 02/19/15   Renford Dills, NP  predniSONE (STERAPRED UNI-PAK 21 TAB) 10 MG (21) TBPK tablet Take 1 tablet (10 mg total) by mouth daily. Take 6 tablets the first day, take 5 tablets the second day, take 4 tablets the third day, take 3 tablets the fourth day, take 2 tablets the fifth day, take 1 tablet the sixth day. 05/18/16   Orvil Feil, PA-C    Allergies Patient has no known allergies.  No family history on file.  Social History Social History  Substance Use Topics  . Smoking status: Current Every Day Smoker    Packs/day: 0.50    Types: Cigarettes  . Smokeless tobacco: Never Used  . Alcohol use No    Review of Systems  Constitutional: No fever/chills ENT: as  above Cardiovascular: Denies chest pain. Respiratory: Denies shortness of breath. Gastrointestinal: No abdominal pain.  No nausea, no vomiting.  No diarrhea.  No constipation. Genitourinary: Negative for dysuria. Neurological: Negative for headaches, focal weakness or numbness.   ____________________________________________   PHYSICAL EXAM:  VITAL SIGNS: ED Triage Vitals  Enc Vitals Group     BP 12/05/16 0441 134/89     Pulse Rate 12/05/16 0441 65     Resp 12/05/16 0441 18     Temp 12/05/16 0441 98.1 F (36.7 C)     Temp Source 12/05/16 0441 Oral     SpO2 12/05/16 0441 98 %     Weight 12/05/16 0442 145 lb (65.8 kg)     Height 12/05/16 0442 5\' 5"  (1.651 m)     Head Circumference --      Peak Flow --      Pain Score --      Pain Loc --      Pain Edu? --      Excl. in GC? --     Constitutional: Alert and oriented. Well appearing and in no acute distress. Eyes: Conjunctivae are normal.  Head: Atraumatic.  Small insect visualized in the right ear external canal. Nose: No congestion/rhinnorhea. Mouth/Throat: Mucous membranes are moist.  Neck: No stridor.   Cardiovascular: Normal rate, regular rhythm. Grossly normal heart sounds.   Respiratory: Normal respiratory effort.  No retractions. Lungs CTAB. Gastrointestinal: Soft  and nontender. No distention. Musculoskeletal: No lower extremity tenderness nor edema.  No joint effusions. Neurologic:  Normal speech and language. No gross focal neurologic deficits are appreciated. Skin:  Skin is warm, dry and intact. No rash noted. Psychiatric: Mood and affect are normal. Speech and behavior are normal.  ____________________________________________   LABS (all labs ordered are listed, but only abnormal results are displayed)  Labs Reviewed - No data to  display ____________________________________________  EKG   ____________________________________________  RADIOLOGY   ____________________________________________   PROCEDURES  Procedure(s) performed:  Patient draped with a towel over the right shoulder. A 60 cc syringe was filled with warm water. I used an 18-gauge Angiocath was able to flush a 1 cm long insect from the patient's right external canal. The insect came out in its entirety. The hepatic membrane is intact and the canal is normal. No residual bug parts visualized. Also with a normal left sided TM and external canal.  Procedures  Critical Care performed:   ____________________________________________   INITIAL IMPRESSION / ASSESSMENT AND PLAN / ED COURSE  Pertinent labs & imaging results that were available during my care of the patient were reviewed by me and considered in my medical decision making (see chart for details).  Foreign body to the right external ear canal, insect.      ____________________________________________   FINAL CLINICAL IMPRESSION(S) / ED DIAGNOSES  Final diagnoses:  None      NEW MEDICATIONS STARTED DURING THIS VISIT:  New Prescriptions   No medications on file     Note:  This document was prepared using Dragon voice recognition software and may include unintentional dictation errors.     Myrna Blazer, MD 12/05/16 [phone]

## 2016-12-05 NOTE — ED Triage Notes (Signed)
Pt comes via [facility] from home with c/o roach in right ear. Per EMS pt was woken up from sleep and feels something crawling around in his ear. VS stable. Pt A&OX4. Respirations even and unlabored.

## 2016-12-30 ENCOUNTER — Emergency Department
Admission: EM | Admit: 2016-12-30 | Discharge: 2016-12-30 | Disposition: A | Discharge status: Home / Self Care | Payer: Self-pay | Attending: Emergency Medicine | Admitting: Emergency Medicine

## 2016-12-30 ENCOUNTER — Emergency Department: Payer: Self-pay

## 2016-12-30 DIAGNOSIS — F17210 Nicotine dependence, cigarettes, uncomplicated: Secondary | ICD-10-CM | POA: Insufficient documentation

## 2016-12-30 DIAGNOSIS — Z79899 Other long term (current) drug therapy: Secondary | ICD-10-CM | POA: Insufficient documentation

## 2016-12-30 DIAGNOSIS — J45909 Unspecified asthma, uncomplicated: Secondary | ICD-10-CM | POA: Insufficient documentation

## 2016-12-30 DIAGNOSIS — J40 Bronchitis, not specified as acute or chronic: Secondary | ICD-10-CM

## 2016-12-30 DIAGNOSIS — R05 Cough: Secondary | ICD-10-CM | POA: Insufficient documentation

## 2016-12-30 DIAGNOSIS — R070 Pain in throat: Secondary | ICD-10-CM | POA: Insufficient documentation

## 2016-12-30 DIAGNOSIS — J069 Acute upper respiratory infection, unspecified: Secondary | ICD-10-CM | POA: Insufficient documentation

## 2016-12-30 DIAGNOSIS — J209 Acute bronchitis, unspecified: Secondary | ICD-10-CM | POA: Insufficient documentation

## 2016-12-30 MED ORDER — FLUTICASONE PROPIONATE 50 MCG/ACT NA SUSP: 1.0000 | Freq: Two times a day (BID) | NASAL | 0 refills | Status: DC

## 2016-12-30 MED ORDER — PSEUDOEPH-BROMPHEN-DM 30-2-10 MG/5ML PO SYRP: 10.0000 mL | ORAL_SOLUTION | Freq: Four times a day (QID) | ORAL | 0 refills | Status: DC | PRN

## 2016-12-30 MED ORDER — ALBUTEROL SULFATE HFA 108 (90 BASE) MCG/ACT IN AERS: 2.0000 | INHALATION_SPRAY | RESPIRATORY_TRACT | 0 refills | Status: DC | PRN

## 2016-12-30 MED ORDER — PREDNISONE 50 MG PO TABS: 50.0000 mg | ORAL_TABLET | Freq: Every day | ORAL | 0 refills | Status: DC

## 2016-12-30 NOTE — ED Notes (Signed)
Pt reports not feeling well since Thursday. Pt states that he hasn't eaten in 3 days because he has been coughing and not feeling well. Pt has congested breaths sounds on the left upper and lower portions of his lungs. Pt states that he has been coughing up blood and blowing his nose with blood in his mucous. Pt asking to use the phone, no distress noted at this time

## 2016-12-30 NOTE — ED Triage Notes (Signed)
Pt came to ed via [facility] c/o cough, congestion for 3 days. HAs not tried otc medications.

## 2016-12-30 NOTE — ED Provider Notes (Signed)
[facility] Emergency Department Provider Note  ____________________________________________  Time seen: Approximately 4:55 PM  I have reviewed the triage vital signs and the nursing notes.   HISTORY  Chief Complaint URI    HPI Martin Cooper is a 31 y.o. male who presents emergency department complaining of nasal congestion, sore throat, cough, pain with cough. Patient reports his symptoms have been ongoing 3 days. Patient has not tried any medications over-the-counter for this complaint. Patient reports that he has had a nosebleed and after his nosebleed and coughed up a little bit of blood. No repeat of hemoptysis. Patient denies any headache, visual changes, chest pain, shortness of breath, abdominal pain, nausea or vomiting. Again, no medications for this complaint prior to arrival. No other complaints at this time.   Past Medical History:  Diagnosis Date  . Asthma   . Seizures (HCC)     There are no active problems to display for this patient.   History reviewed. No pertinent surgical history.  Prior to Admission medications   Medication Sig Start Date End Date Taking? Authorizing Provider  albuterol (PROVENTIL HFA;VENTOLIN HFA) 108 (90 Base) MCG/ACT inhaler Inhale 2 puffs into the lungs every 4 (four) hours as needed for wheezing or shortness of breath. 12/30/16   Davante Gerke, Delorise Royals, PA-C  brompheniramine-pseudoephedrine-DM 30-2-10 MG/5ML syrup Take 10 mLs by mouth 4 (four) times daily as needed. 12/30/16   Asra Gambrel, Delorise Royals, PA-C  fluticasone (FLONASE) 50 MCG/ACT nasal spray Place 1 spray into both nostrils 2 (two) times daily. 12/30/16   Jolanta Cabeza, Delorise Royals, PA-C  predniSONE (DELTASONE) 50 MG tablet Take 1 tablet (50 mg total) by mouth daily with breakfast. 12/30/16   Novice Vrba, Delorise Royals, PA-C    Allergies Patient has no known allergies.  No family history on file.  Social History Social History  Substance Use Topics  . Smoking  status: Current Every Day Smoker    Packs/day: 0.50    Types: Cigarettes  . Smokeless tobacco: Never Used  . Alcohol use No     Review of Systems  Constitutional: No fever/chills Eyes: No visual changes. No discharge ENT: Positive nasal congestion, nosebleed, sore throat. Cardiovascular: no chest pain. Respiratory: Positive cough. No SOB. Gastrointestinal: No abdominal pain.  No nausea, no vomiting.  No diarrhea.  No constipation. Musculoskeletal: Negative for musculoskeletal pain. Skin: Negative for rash, abrasions, lacerations, ecchymosis. Neurological: Negative for headaches, focal weakness or numbness. 10-point ROS otherwise negative.  ____________________________________________   PHYSICAL EXAM:  VITAL SIGNS: ED Triage Vitals  Enc Vitals Group     BP 12/30/16 1647 106/78     Pulse Rate 12/30/16 1647 78     Resp 12/30/16 1647 18     Temp 12/30/16 1647 98.7 F (37.1 C)     Temp Source 12/30/16 1647 Oral     SpO2 12/30/16 1647 98 %     Weight 12/30/16 1648 145 lb (65.8 kg)     Height 12/30/16 1648  (1.651 m)     Head Circumference --      Peak Flow --      Pain Score --      Pain Loc --      Pain Edu? --      Excl. in GC? --      Constitutional: Alert and oriented. Well appearing and in no acute distress. Eyes: Conjunctivae are normal. PERRL. EOMI. Head: Atraumatic. ENT:      Ears: EACs and TMs are unremarkable bilaterally.  Nose: Moderate congestion/rhinnorhea.      Mouth/Throat: Mucous membranes are moist. Oropharynx is nonerythematous and nonedematous. Tonsils are unremarkable bilaterally. Uvula is midline. Neck: No stridor. Neck is supple with full range of motion Hematological/Lymphatic/Immunilogical: No cervical lymphadenopathy. Cardiovascular: Normal rate, regular rhythm. Normal S1 and S2.  Good peripheral circulation. Respiratory: Normal respiratory effort without tachypnea or retractions. Lungs with few scattered coarse breath sounds. No  definitive wheezing, rales, rhonchi.Peri Jefferson air entry to the bases with no decreased or absent breath sounds. Musculoskeletal: Full range of motion to all extremities. No gross deformities appreciated. Neurologic:  Normal speech and language. No gross focal neurologic deficits are appreciated.  Skin:  Skin is warm, dry and intact. No rash noted. Psychiatric: Mood and affect are normal. Speech and behavior are normal. Patient exhibits appropriate insight and judgement.   ____________________________________________   LABS (all labs ordered are listed, but only abnormal results are displayed)  Labs Reviewed - No data to display ____________________________________________  EKG   ____________________________________________  RADIOLOGY Festus Barren Ravon Mcilhenny, personally viewed and evaluated these images (plain radiographs) as part of my medical decision making, as well as reviewing the written report by the radiologist.  Dg Chest 2 View  Result Date: 12/30/2016 CLINICAL DATA:  Cough and dyspnea EXAM: CHEST  2 VIEW COMPARISON:  10/07/2013 chest radiograph. FINDINGS: Stable cardiomediastinal silhouette with normal heart size. No pneumothorax. No pleural effusion. Lungs appear clear, with no acute consolidative airspace disease and no pulmonary edema. IMPRESSION: No active cardiopulmonary disease. Electronically Signed   By: Delbert Phenix M.D.   On: 12/30/2016 17:27    ____________________________________________    PROCEDURES  Procedure(s) performed:    Procedures    Medications - No data to display   ____________________________________________   INITIAL IMPRESSION / ASSESSMENT AND PLAN / ED COURSE  Pertinent labs & imaging results that were available during my care of the patient were reviewed by me and considered in my medical decision making (see chart for details).  Review of the [place] CSRS was performed in accordance of the [facility] prior to dispensing any controlled  drugs.     Patient's diagnosis is consistent with viral respiratory infection and bronchitis. Patient chest x-ray reveals no acute consolidation consistent with pneumonia.. Patient will be discharged home with prescriptions for Flonase, Bromfed, short prednisone course, albuterol inhaler. Patient is to follow up with primary care as needed or otherwise directed. Patient is given ED precautions to return to the ED for any worsening or new symptoms.     ____________________________________________  FINAL CLINICAL IMPRESSION(S) / ED DIAGNOSES  Final diagnoses:  Bronchitis  Acute upper respiratory infection      NEW MEDICATIONS STARTED DURING THIS VISIT:  New Prescriptions   ALBUTEROL (PROVENTIL HFA;VENTOLIN HFA) 108 (90 BASE) MCG/ACT INHALER    Inhale 2 puffs into the lungs every 4 (four) hours as needed for wheezing or shortness of breath.   BROMPHENIRAMINE-PSEUDOEPHEDRINE-DM 30-2-10 MG/5ML SYRUP    Take 10 mLs by mouth 4 (four) times daily as needed.   FLUTICASONE (FLONASE) 50 MCG/ACT NASAL SPRAY    Place 1 spray into both nostrils 2 (two) times daily.   PREDNISONE (DELTASONE) 50 MG TABLET    Take 1 tablet (50 mg total) by mouth daily with breakfast.        This chart was dictated using voice recognition software/Dragon. Despite best efforts to proofread, errors can occur which can change the meaning. Any change was purely unintentional.     Laquita Harlan, Delorise Royals, PA-C  12/30/16 1744    Nita Sickle, MD 12/30/16 2337

## 2018-04-16 HISTORY — PX: HAND SURGERY: SHX662

## 2020-12-01 ENCOUNTER — Emergency Department
Admission: EM | Admit: 2020-12-01 | Discharge: 2020-12-01 | Disposition: A | Discharge status: Home / Self Care | Payer: Self-pay | Attending: Emergency Medicine | Admitting: Emergency Medicine

## 2020-12-01 ENCOUNTER — Other Ambulatory Visit: Payer: Self-pay

## 2020-12-01 ENCOUNTER — Emergency Department: Payer: Self-pay

## 2020-12-01 DIAGNOSIS — F17210 Nicotine dependence, cigarettes, uncomplicated: Secondary | ICD-10-CM | POA: Insufficient documentation

## 2020-12-01 DIAGNOSIS — Z7951 Long term (current) use of inhaled steroids: Secondary | ICD-10-CM | POA: Insufficient documentation

## 2020-12-01 DIAGNOSIS — M25571 Pain in right ankle and joints of right foot: Secondary | ICD-10-CM | POA: Insufficient documentation

## 2020-12-01 DIAGNOSIS — J45909 Unspecified asthma, uncomplicated: Secondary | ICD-10-CM | POA: Insufficient documentation

## 2020-12-01 DIAGNOSIS — W108XXA Fall (on) (from) other stairs and steps, initial encounter: Secondary | ICD-10-CM | POA: Insufficient documentation

## 2020-12-01 MED ORDER — MELOXICAM 7.5 MG PO TABS
15.0000 mg | ORAL_TABLET | Freq: Once | ORAL | Status: AC
Start: 2020-12-01 — End: 2020-12-01
  Administered 2020-12-01: 15 mg via ORAL
  Filled 2020-12-01: qty 2

## 2020-12-01 MED ORDER — MELOXICAM 15 MG PO TABS: 15.0000 mg | ORAL_TABLET | Freq: Every day | ORAL | 0 refills | Status: AC

## 2020-12-01 MED ORDER — ACETAMINOPHEN 325 MG PO TABS
650.0000 mg | ORAL_TABLET | Freq: Once | ORAL | Status: AC
  Administered 2020-12-01: 650 mg via ORAL
  Filled 2020-12-01: qty 2

## 2020-12-01 NOTE — ED Triage Notes (Signed)
Pt comes into the ED via [facility] from home c/o fall yesterday where he fell down 3 steps. Pt c/o right ankle and achilles pain.  Pt has minimal swelling noted and was ambulatory with assistance.  Denies hitting his head, denies any LOC, no blood thinners.   132/90 74 HR 98% RA H/o seizures and having a hard time getting Rx refilled.

## 2020-12-01 NOTE — Discharge Instructions (Addendum)
Please use ASO brace and crutches for ambulation.  Return to the emergency department if you experience any worsening of symptoms.  You may use Tylenol, up to 1000 mg 4 times daily as well as the prescribed Mobic for your pain.  Please schedule a follow-up appointment with orthopedics, their information is above.
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
## 2020-12-01 NOTE — ED Triage Notes (Signed)
Pt arrives via ems from home, pt states that he fell down some steps last night and is having right ankle pain

## 2020-12-04 ENCOUNTER — Encounter: Payer: Self-pay | Admitting: Intensive Care

## 2020-12-04 ENCOUNTER — Other Ambulatory Visit: Payer: Self-pay

## 2020-12-04 ENCOUNTER — Emergency Department
Admission: EM | Admit: 2020-12-04 | Discharge: 2020-12-04 | Disposition: A | Discharge status: Home / Self Care | Payer: Self-pay | Attending: Emergency Medicine | Admitting: Emergency Medicine

## 2020-12-04 DIAGNOSIS — L02411 Cutaneous abscess of right axilla: Secondary | ICD-10-CM | POA: Insufficient documentation

## 2020-12-04 DIAGNOSIS — F17210 Nicotine dependence, cigarettes, uncomplicated: Secondary | ICD-10-CM | POA: Insufficient documentation

## 2020-12-04 DIAGNOSIS — Z79899 Other long term (current) drug therapy: Secondary | ICD-10-CM | POA: Insufficient documentation

## 2020-12-04 DIAGNOSIS — J45909 Unspecified asthma, uncomplicated: Secondary | ICD-10-CM | POA: Insufficient documentation

## 2020-12-04 MED ORDER — CEPHALEXIN 500 MG PO CAPS: 500.0000 mg | ORAL_CAPSULE | Freq: Four times a day (QID) | ORAL | 0 refills | Status: DC

## 2020-12-04 MED ORDER — HYDROCODONE-ACETAMINOPHEN 5-325 MG PO TABS: 1.0000 | ORAL_TABLET | Freq: Four times a day (QID) | ORAL | 0 refills | Status: DC | PRN

## 2020-12-04 NOTE — Discharge Instructions (Addendum)
Follow-up with Dr. Lady Gary if your symptoms are not improving or worsening.  Return emergency department as needed.  Take the antibiotic as prescribed.  Apply warm compress to the area.

## 2020-12-04 NOTE — ED Triage Notes (Signed)
Patient has abscess under right arm with noted pus draining

## 2020-12-04 NOTE — ED Provider Notes (Signed)
[facility] Emergency Department Provider Note  ____________________________________________   Event Date/Time   First MD Initiated Contact with Patient 12/04/20 [phone]     (approximate)  I have reviewed the triage vital signs and the nursing notes.   HISTORY  Chief Complaint Abscess    HPI Martin Cooper is a 35 y.o. male with history of seizures and asthma presents emergency department with abscess to the right axilla.  Patient states symptoms for 2 days.  No fever or chills.  States areas been draining overnight.  Past Medical History:  Diagnosis Date   Asthma    Seizures (HCC)     There are no problems to display for this patient.   History reviewed. No pertinent surgical history.  Prior to Admission medications   Medication Sig Start Date End Date Taking? Authorizing Provider  cephALEXin (KEFLEX) 500 MG capsule Take 1 capsule (500 mg total) by mouth 4 (four) times daily for 10 days. 12/04/20 12/14/20 Yes Vicky Mccanless, Roselyn Bering, PA-C  HYDROcodone-acetaminophen (NORCO/VICODIN) 5-325 MG tablet Take 1 tablet by mouth every 6 (six) hours as needed for moderate pain. 12/04/20  Yes Cassy Sprowl, Roselyn Bering, PA-C  albuterol (PROVENTIL HFA;VENTOLIN HFA) 108 (90 Base) MCG/ACT inhaler Inhale 2 puffs into the lungs every 4 (four) hours as needed for wheezing or shortness of breath. 12/30/16   Cuthriell, Delorise Royals, PA-C  brompheniramine-pseudoephedrine-DM 30-2-10 MG/5ML syrup Take 10 mLs by mouth 4 (four) times daily as needed. 12/30/16   Cuthriell, Delorise Royals, PA-C  fluticasone (FLONASE) 50 MCG/ACT nasal spray Place 1 spray into both nostrils 2 (two) times daily. 12/30/16   Cuthriell, Delorise Royals, PA-C  meloxicam (MOBIC) 15 MG tablet Take 1 tablet (15 mg total) by mouth daily for 15 days. 12/01/20 12/16/20  Lucy Chris, PA  predniSONE (DELTASONE) 50 MG tablet Take 1 tablet (50 mg total) by mouth daily with breakfast. 12/30/16   Cuthriell, Delorise Royals, PA-C     Allergies Patient has no known allergies.  History reviewed. No pertinent family history.  Social History Social History   Tobacco Use   Smoking status: Every Day    Packs/day: 0.50    Types: Cigarettes   Smokeless tobacco: Never  Substance Use Topics   Alcohol use: No   Drug use: No    Review of Systems  Constitutional: No fever/chills Eyes: No visual changes. ENT: No sore throat. Respiratory: Denies cough Cardiovascular: Denies chest pain Gastrointestinal: Denies abdominal pain Genitourinary: Negative for dysuria. Musculoskeletal: Negative for back pain. Skin: Negative for rash. Psychiatric: no mood changes,     ____________________________________________   PHYSICAL EXAM:  VITAL SIGNS: ED Triage Vitals [12/04/20 0854]  Enc Vitals Group     BP 128/88     Pulse Rate 71     Resp 18     Temp 98.1 F (36.7 C)     Temp Source Oral     SpO2 100 %     Weight 180 lb (81.6 kg)     Height 5\' 10"  (1.778 m)     Head Circumference      Peak Flow      Pain Score 10     Pain Loc      Pain Edu?      Excl. in GC?     Constitutional: Alert and oriented. Well appearing and in no acute distress. Eyes: Conjunctivae are normal.  Head: Atraumatic. Nose: No congestion/rhinnorhea. Mouth/Throat: Mucous membranes are moist.   Neck:  supple no lymphadenopathy noted  Cardiovascular: Normal rate, regular rhythm.  Respiratory: Normal respiratory effort.  No retractions GU: deferred Musculoskeletal: FROM all extremities, warm and well perfused Neurologic:  Normal speech and language.  Skin:  Skin is warm, dry and intact. No rash noted.  Abscess noted in the right axilla, area is dry already draining, area is tender to palpation, yellow thick pus is noted Psychiatric: Mood and affect are normal. Speech and behavior are normal.  ____________________________________________   LABS (all labs ordered are listed, but only abnormal results are displayed)  Labs Reviewed -  No data to display ____________________________________________   ____________________________________________  RADIOLOGY    ____________________________________________   PROCEDURES  Procedure(s) performed: No  Procedures    ____________________________________________   INITIAL IMPRESSION / ASSESSMENT AND PLAN / ED COURSE  Pertinent labs & imaging results that were available during my care of the patient were reviewed by me and considered in my medical decision making (see chart for details).   Patient is 35 year old male presents emergency department with abscess to the right axilla.  See HPI.  Physical exam is consistent with same.  The area appears to have almost already completely drained.  We will place patient on antibiotic and instruct him to return to the emergency department worsening.  Or he follow-up with surgery.  He was given a prescription for Keflex and pain medication.  Discharged in stable condition in care of family member.  They are in agreement with treatment plan.     Martin Cooper was evaluated in Emergency Department on 12/04/2020 for the symptoms described in the history of present illness. He was evaluated in the context of the global COVID-19 pandemic, which necessitated consideration that the patient might be at risk for infection with the SARS-CoV-2 virus that causes COVID-19. Institutional protocols and algorithms that pertain to the evaluation of patients at risk for COVID-19 are in a state of rapid change based on information released by regulatory bodies including the CDC and federal and state organizations. These policies and algorithms were followed during the patient's care in the ED.    As part of my medical decision making, I reviewed the following data within the electronic MEDICAL RECORD NUMBER History obtained from family, Nursing notes reviewed and incorporated, Old chart reviewed, Notes from prior ED visits, and [place] Controlled Substance  Database  ____________________________________________   FINAL CLINICAL IMPRESSION(S) / ED DIAGNOSES  Final diagnoses:  Abscess of axilla, right      NEW MEDICATIONS STARTED DURING THIS VISIT:  New Prescriptions   CEPHALEXIN (KEFLEX) 500 MG CAPSULE    Take 1 capsule (500 mg total) by mouth 4 (four) times daily for 10 days.   HYDROCODONE-ACETAMINOPHEN (NORCO/VICODIN) 5-325 MG TABLET    Take 1 tablet by mouth every 6 (six) hours as needed for moderate pain.     Note:  This document was prepared using Dragon voice recognition software and may include unintentional dictation errors.    Faythe Ghee, PA-C 12/04/20 1610    Concha Se, MD 12/04/20 [phone]

## 2020-12-04 NOTE — ED Notes (Signed)
See triage note  Presents with possible abscess area under right arm   States he noticed area couple of days ago  Area began to drain yesterday

## 2020-12-05 ENCOUNTER — Emergency Department
Admission: EM | Admit: 2020-12-05 | Discharge: 2020-12-05 | Disposition: A | Discharge status: Home / Self Care | Payer: Self-pay | Attending: Emergency Medicine | Admitting: Emergency Medicine

## 2020-12-05 ENCOUNTER — Other Ambulatory Visit: Payer: Self-pay

## 2020-12-05 ENCOUNTER — Encounter: Payer: Self-pay | Admitting: Emergency Medicine

## 2020-12-05 DIAGNOSIS — G40919 Epilepsy, unspecified, intractable, without status epilepticus: Secondary | ICD-10-CM

## 2020-12-05 DIAGNOSIS — F17210 Nicotine dependence, cigarettes, uncomplicated: Secondary | ICD-10-CM | POA: Insufficient documentation

## 2020-12-05 DIAGNOSIS — R569 Unspecified convulsions: Secondary | ICD-10-CM | POA: Insufficient documentation

## 2020-12-05 DIAGNOSIS — J45909 Unspecified asthma, uncomplicated: Secondary | ICD-10-CM | POA: Insufficient documentation

## 2020-12-05 DIAGNOSIS — Z7952 Long term (current) use of systemic steroids: Secondary | ICD-10-CM | POA: Insufficient documentation

## 2020-12-05 LAB — BASIC METABOLIC PANEL
Anion gap: 5 (ref 5–15)
BUN: 9 mg/dL (ref 6–20)
CO2: 27 mmol/L (ref 22–32)
Calcium: 9.1 mg/dL (ref 8.9–10.3)
Chloride: 106 mmol/L (ref 98–111)
Creatinine, Ser: 1.09 mg/dL (ref 0.61–1.24)
GFR, Estimated: >60 mL/min (ref >60)
Glucose, Bld: 88 mg/dL (ref 70–99)
Potassium: 4.2 mmol/L (ref 3.5–5.1)
Sodium: 138 mmol/L (ref 135–145)

## 2020-12-05 LAB — CBC WITH DIFFERENTIAL/PLATELET
Abs Immature Granulocytes: 0.01 10*3/uL (ref 0.00–0.07)
Basophils Absolute: 0.1 10*3/uL (ref 0.0–0.1)
Basophils Relative: 1 %
Eosinophils Absolute: 0.7 10*3/uL — ABNORMAL HIGH (ref 0.0–0.5)
Eosinophils Relative: 10 %
HCT: 37.6 % — ABNORMAL LOW (ref 39.0–52.0)
Hemoglobin: 13 g/dL (ref 13.0–17.0)
Immature Granulocytes: 0 %
Lymphocytes Relative: 44 %
Lymphs Abs: 3.1 10*3/uL (ref 0.7–4.0)
MCH: 31.8 pg (ref 26.0–34.0)
MCHC: 34.6 g/dL (ref 30.0–36.0)
MCV: 91.9 fL (ref 80.0–100.0)
Monocytes Absolute: 0.5 10*3/uL (ref 0.1–1.0)
Monocytes Relative: 6 %
Neutro Abs: 2.8 10*3/uL (ref 1.7–7.7)
Neutrophils Relative %: 39 %
Platelets: 300 10*3/uL (ref 150–400)
RBC: 4.09 MIL/uL — ABNORMAL LOW (ref 4.22–5.81)
RDW: 12 % (ref 11.5–15.5)
WBC: 7.2 10*3/uL (ref 4.0–10.5)
nRBC: 0 % (ref 0.0–0.2)

## 2020-12-05 MED ORDER — LEVETIRACETAM 500 MG PO TABS: 500.0000 mg | ORAL_TABLET | Freq: Two times a day (BID) | ORAL | 0 refills | Status: DC

## 2020-12-05 MED ORDER — LEVETIRACETAM 500 MG PO TABS
500.0000 mg | ORAL_TABLET | Freq: Two times a day (BID) | ORAL | 0 refills | Status: DC
  Filled 2020-12-05: qty 60, 30d supply, fill #0

## 2020-12-05 MED ORDER — DOXYCYCLINE HYCLATE 100 MG PO CAPS
100.0000 mg | ORAL_CAPSULE | Freq: Two times a day (BID) | ORAL | 0 refills | Status: DC
  Filled 2020-12-05: qty 14, 7d supply, fill #0

## 2020-12-05 MED ORDER — DOXYCYCLINE HYCLATE 100 MG PO CAPS: 100.0000 mg | ORAL_CAPSULE | Freq: Two times a day (BID) | ORAL | 0 refills | Status: AC

## 2020-12-05 MED ORDER — DOXYCYCLINE HYCLATE 100 MG PO TABS
100.0000 mg | ORAL_TABLET | Freq: Once | ORAL | Status: AC
  Administered 2020-12-05: 100 mg via ORAL
  Filled 2020-12-05: qty 1

## 2020-12-05 MED ORDER — LORAZEPAM 2 MG/ML IJ SOLN
1.0000 mg | Freq: Once | INTRAMUSCULAR | Status: AC
  Administered 2020-12-05: 1 mg via INTRAVENOUS
  Filled 2020-12-05: qty 1

## 2020-12-05 MED ORDER — KETOROLAC TROMETHAMINE 30 MG/ML IJ SOLN
15.0000 mg | Freq: Once | INTRAMUSCULAR | Status: DC
Start: 2020-12-05 — End: 2020-12-05

## 2020-12-05 MED ORDER — LACTATED RINGERS IV BOLUS
1000.0000 mL | Freq: Once | INTRAVENOUS | Status: AC
  Administered 2020-12-05: 1000 mL via INTRAVENOUS

## 2020-12-05 MED ORDER — LEVETIRACETAM IN NACL 1000 MG/100ML IV SOLN
1000.0000 mg | Freq: Once | INTRAVENOUS | Status: AC
  Administered 2020-12-05: 1000 mg via INTRAVENOUS
  Filled 2020-12-05: qty 100

## 2020-12-05 NOTE — Discharge Instructions (Addendum)
For your Keppra, I've called this into the Medication Management clinic at [address] They should be able to assist you with affording your medications.  If you would like another pharmacy, I've given a paper prescription as well. If you decide to get this filled otherwise, cancel your prescription at [facility].  I've also called in a different antibiotic to help with the infection.

## 2020-12-05 NOTE — ED Triage Notes (Signed)
Pt arrives via EMS from home after seizures witnessed by wife. Pt reports being out of medication for a week. Reports pain to right arm from abscess that was drained yesterday.

## 2020-12-05 NOTE — ED Notes (Signed)
D/C and RX's discussed with pt, pt verbalized understanding. Pt educated his medications were set to medication management across the street, pt verbalized understanding. NAD noted.   Pt sent home with graham crackers and grape juice because he states '"I am hungry".   Pt ambulatory with steady gait on D/C.
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
## 2020-12-06 ENCOUNTER — Other Ambulatory Visit: Payer: Self-pay

## 2020-12-18 ENCOUNTER — Other Ambulatory Visit: Payer: Self-pay

## 2020-12-18 ENCOUNTER — Emergency Department
Admission: EM | Admit: 2020-12-18 | Discharge: 2020-12-18 | Disposition: A | Discharge status: Home / Self Care | Payer: Self-pay | Attending: Emergency Medicine | Admitting: Emergency Medicine

## 2020-12-18 ENCOUNTER — Emergency Department: Payer: Self-pay

## 2020-12-18 DIAGNOSIS — X501XXA Overexertion from prolonged static or awkward postures, initial encounter: Secondary | ICD-10-CM | POA: Insufficient documentation

## 2020-12-18 DIAGNOSIS — Y9301 Activity, walking, marching and hiking: Secondary | ICD-10-CM | POA: Insufficient documentation

## 2020-12-18 DIAGNOSIS — F17210 Nicotine dependence, cigarettes, uncomplicated: Secondary | ICD-10-CM | POA: Insufficient documentation

## 2020-12-18 DIAGNOSIS — J45909 Unspecified asthma, uncomplicated: Secondary | ICD-10-CM | POA: Insufficient documentation

## 2020-12-18 DIAGNOSIS — Y92009 Unspecified place in unspecified non-institutional (private) residence as the place of occurrence of the external cause: Secondary | ICD-10-CM | POA: Insufficient documentation

## 2020-12-18 DIAGNOSIS — S93401A Sprain of unspecified ligament of right ankle, initial encounter: Secondary | ICD-10-CM | POA: Insufficient documentation

## 2020-12-18 DIAGNOSIS — Z7952 Long term (current) use of systemic steroids: Secondary | ICD-10-CM | POA: Insufficient documentation

## 2020-12-18 NOTE — Discharge Instructions (Addendum)
Your x-ray is negative for any fracture or dislocation.  You have a mild ankle sprain.  Wear the Ace bandage as needed for support.  Rest with the foot elevated and apply ice as needed.  Take over-the-counter Tylenol or Motrin for pain relief.  Follow-up with your primary provider or return to the ED if needed.

## 2020-12-18 NOTE — ED Triage Notes (Signed)
Pt comes ems with right ankle pain after rolling it while walking down the steps. Ambulatory on scene.

## 2020-12-18 NOTE — ED Notes (Signed)
See triage note  Presents with pain to posterior right ankle  States he fell down steps this am  Ambulates with slight limp

## 2020-12-18 NOTE — ED Provider Notes (Signed)
[facility] Emergency Department Provider Note ____________________________________________  Time seen: 1404  I have reviewed the triage vital signs and the nursing notes.  HISTORY  Chief Complaint  Ankle Pain   HPI MCIHAEL Martin Cooper is a 35 y.o. male presents to the ED via EMS from home, with reports of ankle pain after he rolled it.  Patient describes he was walking take out the trash, and he turned his ankle causing posterior injury.  He denies any head injury or LOC.  He presents with pain localized to the Achilles tendon of the right ankle.  No other injury reported at this time.  Past Medical History:  Diagnosis Date   Asthma    Seizures (HCC)     There are no problems to display for this patient.   History reviewed. No pertinent surgical history.  Prior to Admission medications   Medication Sig Start Date End Date Taking? Authorizing Provider  albuterol (PROVENTIL HFA;VENTOLIN HFA) 108 (90 Base) MCG/ACT inhaler Inhale 2 puffs into the lungs every 4 (four) hours as needed for wheezing or shortness of breath. 12/30/16   Cuthriell, Delorise Royals, PA-C  brompheniramine-pseudoephedrine-DM 30-2-10 MG/5ML syrup Take 10 mLs by mouth 4 (four) times daily as needed. 12/30/16   Cuthriell, Delorise Royals, PA-C  fluticasone (FLONASE) 50 MCG/ACT nasal spray Place 1 spray into both nostrils 2 (two) times daily. 12/30/16   Cuthriell, Delorise Royals, PA-C  levETIRAcetam (KEPPRA) 500 MG tablet Take 1 tablet (500 mg total) by mouth 2 (two) times daily. 12/05/20 01/04/21  Shaune Pollack, MD    Allergies Patient has no known allergies.  History reviewed. No pertinent family history.  Social History Social History   Tobacco Use   Smoking status: Every Day    Packs/day: 0.50    Types: Cigarettes   Smokeless tobacco: Never  Substance Use Topics   Alcohol use: No   Drug use: No    Review of Systems  Constitutional: Negative for fever. Cardiovascular: Negative for chest  pain. Respiratory: Negative for shortness of breath. Gastrointestinal: Negative for abdominal pain, vomiting and diarrhea. Genitourinary: Negative for dysuria. Musculoskeletal: Negative for back pain.  Ankle pain as above. Skin: Negative for rash. Neurological: Negative for headaches, focal weakness or numbness. ____________________________________________  PHYSICAL EXAM:  VITAL SIGNS: ED Triage Vitals  Enc Vitals Group     BP 12/18/20 1254 115/74     Pulse Rate 12/18/20 1254 78     Resp 12/18/20 1254 18     Temp 12/18/20 1254 98.4 F (36.9 C)     Temp Source 12/18/20 1254 Oral     SpO2 12/18/20 1254 99 %     Weight 12/18/20 1238 180 lb 12.4 oz (82 kg)     Height 12/18/20 1238 5\' 10"  (1.778 m)     Head Circumference --      Peak Flow --      Pain Score 12/18/20 1238 7     Pain Loc --      Pain Edu? --      Excl. in GC? --     Constitutional: Alert and oriented. Well appearing and in no distress. Head: Normocephalic and atraumatic. Cardiovascular: Normal rate, regular rhythm. Normal distal pulses. Respiratory: Normal respiratory effort. No wheezes/rales/rhonchi. Musculoskeletal: Right ankle without obvious deformity or dislocation.  No joint effusions are appreciated.  Patient with normal ankle range appreciated.  Mildly tender to palpation to the posterior Achilles tendon.  Achilles is intact with normal plantar flexion on compression of the  gastroc.  Nontender with normal range of motion in all extremities.  Neurologic:  Normal gait without ataxia. Normal speech and language. No gross focal neurologic deficits are appreciated. Skin:  Skin is warm, dry and intact. No rash noted. Psychiatric: Mood and affect are normal. Patient exhibits appropriate insight and judgment. ____________________________________________    {LABS (pertinent positives/negatives)  ____________________________________________  {EKG  ____________________________________________    RADIOLOGY Official radiology report(s): DG Ankle Complete Right  Result Date: 12/18/2020 CLINICAL DATA:  Pt comes ems with right ankle pain after rolling it while walking down the EXAM: RIGHT ANKLE - COMPLETE 3+ VIEW COMPARISON:  Right ankle radiographs 12/01/2020 FINDINGS: There is no evidence of fracture, dislocation, or joint effusion. There is no evidence of arthropathy or other focal bone abnormality. Soft tissues are unremarkable. IMPRESSION: Negative. Electronically Signed   By: Emmaline Kluver M.D.   On: 12/18/2020 14:41   ____________________________________________  PROCEDURES  Ace bandage Procedures ____________________________________________   INITIAL IMPRESSION / ASSESSMENT AND PLAN / ED COURSE  As part of my medical decision making, I reviewed the following data within the electronic MEDICAL RECORD NUMBER Radiograph reviewed WNL and Notes from prior ED visits     DDX: ankle sprain, achilles tendon rupture, ankle fracture  Patient ED evaluation of acute ankle pain follow mechanical injury.  Exam is overall benign reassuring at this time.  No radiologic evidence of any acute fracture or dislocation.  Patient placed in Ace bandage, and given instruction for first-aid management of an ankle sprain.  He will see his primary provider for ongoing symptoms.  Return precautions been reviewed.  DONTREL Martin Cooper was evaluated in Emergency Department on 12/18/2020 for the symptoms described in the history of present illness. He was evaluated in the context of the global COVID-19 pandemic, which necessitated consideration that the patient might be at risk for infection with the SARS-CoV-2 virus that causes COVID-19. Institutional protocols and algorithms that pertain to the evaluation of patients at risk for COVID-19 are in a state of rapid change based on information released by regulatory bodies including the CDC and federal and state organizations. These policies and algorithms were followed  during the patient's care in the ED. ____________________________________________  FINAL CLINICAL IMPRESSION(S) / ED DIAGNOSES  Final diagnoses:  Sprain of right ankle, unspecified ligament, initial encounter      Lissa Hoard, PA-C 12/18/20 1452    Chesley Noon, MD 12/19/20 1622

## 2021-01-09 ENCOUNTER — Emergency Department: Payer: Self-pay

## 2021-01-09 ENCOUNTER — Other Ambulatory Visit: Payer: Self-pay

## 2021-01-09 DIAGNOSIS — R6884 Jaw pain: Secondary | ICD-10-CM | POA: Insufficient documentation

## 2021-01-09 DIAGNOSIS — Z5321 Procedure and treatment not carried out due to patient leaving prior to being seen by health care provider: Secondary | ICD-10-CM | POA: Insufficient documentation
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
## 2021-01-10 ENCOUNTER — Emergency Department
Admission: EM | Admit: 2021-01-10 | Discharge: 2021-01-10 | Disposition: A | Discharge status: Home / Self Care | Payer: Self-pay | Attending: Emergency Medicine | Admitting: Emergency Medicine

## 2021-01-13 ENCOUNTER — Telehealth: Payer: Self-pay | Admitting: Emergency Medicine

## 2021-01-13 NOTE — Telephone Encounter (Signed)
Called patient due to left emergency department before provider exam to inquire about condition and follow up plans. Left message asking him to call me, as he has abnormality on xray and should follow up on that.
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
## 2021-01-26 ENCOUNTER — Emergency Department
Admission: EM | Admit: 2021-01-26 | Discharge: 2021-01-26 | Disposition: A | Discharge status: Home / Self Care | Payer: Self-pay | Attending: Emergency Medicine | Admitting: Emergency Medicine

## 2021-01-26 ENCOUNTER — Emergency Department: Payer: Self-pay

## 2021-01-26 ENCOUNTER — Other Ambulatory Visit: Payer: Self-pay

## 2021-01-26 DIAGNOSIS — G40909 Epilepsy, unspecified, not intractable, without status epilepticus: Secondary | ICD-10-CM | POA: Insufficient documentation

## 2021-01-26 DIAGNOSIS — F17210 Nicotine dependence, cigarettes, uncomplicated: Secondary | ICD-10-CM | POA: Insufficient documentation

## 2021-01-26 DIAGNOSIS — R569 Unspecified convulsions: Secondary | ICD-10-CM

## 2021-01-26 DIAGNOSIS — J45909 Unspecified asthma, uncomplicated: Secondary | ICD-10-CM | POA: Insufficient documentation

## 2021-01-26 LAB — CBC WITH DIFFERENTIAL/PLATELET
Abs Immature Granulocytes: 0.01 10*3/uL (ref 0.00–0.07)
Basophils Absolute: 0.1 10*3/uL (ref 0.0–0.1)
Basophils Relative: 1 %
Eosinophils Absolute: 0.3 10*3/uL (ref 0.0–0.5)
Eosinophils Relative: 5 %
HCT: 41.9 % (ref 39.0–52.0)
Hemoglobin: 14.5 g/dL (ref 13.0–17.0)
Immature Granulocytes: 0 %
Lymphocytes Relative: 48 %
Lymphs Abs: 2.8 10*3/uL (ref 0.7–4.0)
MCH: 32.3 pg (ref 26.0–34.0)
MCHC: 34.6 g/dL (ref 30.0–36.0)
MCV: 93.3 fL (ref 80.0–100.0)
Monocytes Absolute: 0.3 10*3/uL (ref 0.1–1.0)
Monocytes Relative: 6 %
Neutro Abs: 2.3 10*3/uL (ref 1.7–7.7)
Neutrophils Relative %: 40 %
Platelets: 308 10*3/uL (ref 150–400)
RBC: 4.49 MIL/uL (ref 4.22–5.81)
RDW: 11.7 % (ref 11.5–15.5)
WBC: 5.8 10*3/uL (ref 4.0–10.5)
nRBC: 0 % (ref 0.0–0.2)

## 2021-01-26 LAB — COMPREHENSIVE METABOLIC PANEL
ALT: 16 U/L (ref 0–44)
AST: 23 U/L (ref 15–41)
Albumin: 4.2 g/dL (ref 3.5–5.0)
Alkaline Phosphatase: 63 U/L (ref 38–126)
Anion gap: 8 (ref 5–15)
BUN: 6 mg/dL (ref 6–20)
CO2: 27 mmol/L (ref 22–32)
Calcium: 9.2 mg/dL (ref 8.9–10.3)
Chloride: 101 mmol/L (ref 98–111)
Creatinine, Ser: 1.02 mg/dL (ref 0.61–1.24)
GFR, Estimated: >60 mL/min (ref >60)
Glucose, Bld: 103 mg/dL — ABNORMAL HIGH (ref 70–99)
Potassium: 4.5 mmol/L (ref 3.5–5.1)
Sodium: 136 mmol/L (ref 135–145)
Total Bilirubin: 0.6 mg/dL (ref 0.3–1.2)
Total Protein: 7.5 g/dL (ref 6.5–8.1)

## 2021-01-26 MED ORDER — LEVETIRACETAM 500 MG PO TABS: 500.0000 mg | ORAL_TABLET | Freq: Two times a day (BID) | ORAL | 2 refills | Status: DC

## 2021-01-26 MED ORDER — SODIUM CHLORIDE 0.9 % IV SOLN: Freq: Once | INTRAVENOUS | Status: AC

## 2021-01-26 MED ORDER — LEVETIRACETAM IN NACL 1000 MG/100ML IV SOLN
1000.0000 mg | Freq: Once | INTRAVENOUS | Status: AC
  Administered 2021-01-26: 1000 mg via INTRAVENOUS
  Filled 2021-01-26: qty 100

## 2021-01-26 NOTE — ED Notes (Signed)
Pt up to restroom.

## 2021-01-26 NOTE — ED Triage Notes (Signed)
EMS called to home for c/o left ear pain and seizures today. Per EMS report, VS wnl.  No apparent seizure. Patient is AAOx3.  Shakes to right side occationally, but remains purposeful.  18g LAC.

## 2021-01-26 NOTE — ED Triage Notes (Signed)
Pt come with c/o left ear pain since last week. Pt states when this happens he has a seizure. Pt states one today and does take meds. Pt states some pain to right side of face from falling.

## 2021-01-26 NOTE — ED Provider Notes (Signed)
[facility] Emergency Department Provider Note  ____________________________________________  Time seen: Approximately 7:08 PM  I have reviewed the triage vital signs and the nursing notes.   HISTORY  Chief Complaint Seizures    HPI Martin Cooper is a 35 y.o. male with a past history of seizures who comes ED complaining of a seizure earlier today.  He reports that he has been having seizures more frequently lately.  States that has been compliant with his Keppra, but later clarifies that he only takes it at night.  No recent head trauma or significant illness.  No vomiting or diarrhea.  Tolerating normal oral intake.  Today he had an episode of left ear pain which he identifies as his usual seizure prodrome.  Reportedly there was no postictal phase.  No urinary incontinence    Past Medical History:  Diagnosis Date   Asthma    Seizures (HCC)      There are no problems to display for this patient.    History reviewed. No pertinent surgical history.   Prior to Admission medications   Medication Sig Start Date End Date Taking? Authorizing Provider  albuterol (PROVENTIL HFA;VENTOLIN HFA) 108 (90 Base) MCG/ACT inhaler Inhale 2 puffs into the lungs every 4 (four) hours as needed for wheezing or shortness of breath. 12/30/16   Cuthriell, Delorise Royals, PA-C  brompheniramine-pseudoephedrine-DM 30-2-10 MG/5ML syrup Take 10 mLs by mouth 4 (four) times daily as needed. 12/30/16   Cuthriell, Delorise Royals, PA-C  fluticasone (FLONASE) 50 MCG/ACT nasal spray Place 1 spray into both nostrils 2 (two) times daily. 12/30/16   Cuthriell, Delorise Royals, PA-C  levETIRAcetam (KEPPRA) 500 MG tablet Take 1 tablet (500 mg total) by mouth 2 (two) times daily. 01/26/21 02/25/21  Sharman Cheek, MD     Allergies Patient has no known allergies.   No family history on file.  Social History Social History   Tobacco Use   Smoking status: Every Day    Packs/day: 0.50    Types:  Cigarettes   Smokeless tobacco: Never  Substance Use Topics   Alcohol use: No   Drug use: No    Review of Systems  Constitutional:   No fever or chills.  ENT:   No sore throat. No rhinorrhea. Cardiovascular:   No chest pain or syncope. Respiratory:   No dyspnea or cough. Gastrointestinal:   Negative for abdominal pain, vomiting and diarrhea.  Musculoskeletal:   Negative for focal pain or swelling All other systems reviewed and are negative except as documented above in ROS and HPI.  ____________________________________________   PHYSICAL EXAM:  VITAL SIGNS: ED Triage Vitals  Enc Vitals Group     BP 01/26/21 1154 (!) 127/92     Pulse Rate 01/26/21 1154 (!) 57     Resp 01/26/21 1154 18     Temp 01/26/21 1154 98 F (36.7 C)     Temp Source 01/26/21 1700 Oral     SpO2 01/26/21 1154 99 %     Weight 01/26/21 1701 179 lb 14.3 oz (81.6 kg)     Height 01/26/21 1701 5\' 5"  (1.651 m)     Head Circumference --      Peak Flow --      Pain Score 01/26/21 1159 10     Pain Loc --      Pain Edu? --      Excl. in GC? --     Vital signs reviewed, nursing assessments reviewed.   Constitutional:   Alert and  oriented. Non-toxic appearance. Eyes:   Conjunctivae are normal. EOMI. PERRL. ENT      Head:   Normocephalic and atraumatic.      Nose:   Normal      Mouth/Throat: Moist mucosa, no intraoral injury      Neck:   No meningismus. Full ROM. Hematological/Lymphatic/Immunilogical:   No cervical lymphadenopathy. Cardiovascular:   RRR. Symmetric bilateral radial and DP pulses.  No murmurs. Cap refill less than 2 seconds. Respiratory:   Normal respiratory effort without tachypnea/retractions. Breath sounds are clear and equal bilaterally. No wheezes/rales/rhonchi. Gastrointestinal:   Soft and nontender. Non distended. There is no CVA tenderness.  No rebound, rigidity, or guarding. Genitourinary:   deferred Musculoskeletal:   Normal range of motion in all extremities. No joint effusions.   No lower extremity tenderness.  No edema. Neurologic:   Normal speech and language.  Motor grossly intact. No acute focal neurologic deficits are appreciated.  Skin:    Skin is warm, dry and intact. No rash noted.  No petechiae, purpura, or bullae.  ____________________________________________    LABS (pertinent positives/negatives) (all labs ordered are listed, but only abnormal results are displayed) Labs Reviewed  COMPREHENSIVE METABOLIC PANEL - Abnormal; Notable for the following components:      Result Value   Glucose, Bld 103 (*)    All other components within normal limits  CBC WITH DIFFERENTIAL/PLATELET  CBG MONITORING, ED   ____________________________________________   EKG    ____________________________________________    RADIOLOGY  CT HEAD WO CONTRAST ( )  Result Date: 01/26/2021 CLINICAL DATA:  Seizure yesterday.  Headache and weakness today. EXAM: CT HEAD WITHOUT CONTRAST TECHNIQUE: Contiguous axial images were obtained from the base of the skull through the vertex without intravenous contrast. COMPARISON:  12/24/2013 FINDINGS: Brain: No mass lesion, hemorrhage, hydrocephalus, acute infarct, intra-axial, or extra-axial fluid collection. Vascular: No hyperdense vessel or unexpected calcification. Skull: No significant soft tissue swelling.  No skull fracture. Sinuses/Orbits: Normal imaged portions of the orbits and globes. Clear paranasal sinuses and mastoid air cells. Other: None. IMPRESSION: Normal head CT. Electronically Signed   By: Jeronimo Greaves M.D.   On: 01/26/2021 13:16    ____________________________________________   PROCEDURES Procedures  ____________________________________________    CLINICAL IMPRESSION / ASSESSMENT AND PLAN / ED COURSE  Medications ordered in the ED: Medications  levETIRAcetam (KEPPRA) IVPB 1000 mg/100 mL premix (0 mg Intravenous Stopped 01/26/21 1756)  0.9 %  sodium chloride infusion (0 mLs Intravenous Stopped 01/26/21  1813)    Pertinent labs & imaging results that were available during my care of the patient were reviewed by me and considered in my medical decision making (see chart for details).  CLAUDY ABDALLAH was evaluated in Emergency Department on 01/26/2021 for the symptoms described in the history of present illness. He was evaluated in the context of the global COVID-19 pandemic, which necessitated consideration that the patient might be at risk for infection with the SARS-CoV-2 virus that causes COVID-19. Institutional protocols and algorithms that pertain to the evaluation of patients at risk for COVID-19 are in a state of rapid change based on information released by regulatory bodies including the CDC and federal and state organizations. These policies and algorithms were followed during the patient's care in the ED.   Patient presents with possible seizure in the setting of epilepsy and medication noncompliance.  Educated the patient regarding the proper dosage of the Keppra, gave a loading dose in the ED.  Patient has not had any abnormal episodes  or seizures in the emergency department.  He is nontoxic, requesting something to eat and a TV remote, vital signs are normal, exam is reassuring, stable for discharge.  I will provide a new prescription for his Keppra.      ____________________________________________   FINAL CLINICAL IMPRESSION(S) / ED DIAGNOSES    Final diagnoses:  Seizure-like activity [facility])     ED Discharge Orders          Ordered    levETIRAcetam (KEPPRA) 500 MG tablet  2 times daily        01/26/21 1907            Portions of this note were generated with dragon dictation software. Dictation errors may occur despite best attempts at proofreading.    Sharman Cheek, MD 01/26/21 1910

## 2021-01-26 NOTE — ED Notes (Signed)
Pt given food tray from fridge while waiting for dinner tray and drink of choice.

## 2021-01-26 NOTE — ED Notes (Signed)
Lights dimmed per pt request; call bell remains within reach; stretcher locked low; rails up.

## 2021-01-26 NOTE — ED Notes (Signed)
Pt reports had seizure today; states girlfriend was with him; A&Ox4 currently; stretcher locked low; rails up; call bell within reach; pt currently talking on his personal phone.

## 2021-01-26 NOTE — ED Provider Notes (Signed)
Emergency Medicine Provider Triage Evaluation Note  ELDO UMANZOR , a 35 y.o. male  was evaluated in triage.  Pt complains of left ear pain and seizure.  History was seen.  Review of Systems  Positive: Seizure this morning Negative: Chest pain, shortness of breath  Physical Exam  BP (!) 127/92   Pulse (!) 57   Temp 98 F (36.7 C)   Resp 18   SpO2 99%  Gen:   Awake, no distress   Resp:  Normal effort  MSK:   Moves extremities without difficulty  Other:  Left ear has no redness or swelling.  Medical Decision Making  Medically screening exam initiated at 11:56 AM.  Appropriate orders placed.  JEANLUC WEGMAN was informed that the remainder of the evaluation will be completed by another provider, this initial triage assessment does not replace that evaluation, and the importance of remaining in the ED until their evaluation is complete.     Faythe Ghee, PA-C 01/26/21 1156    Sharman Cheek, MD 01/26/21 1949

## 2021-01-28 ENCOUNTER — Other Ambulatory Visit: Payer: Self-pay

## 2021-01-28 ENCOUNTER — Encounter: Payer: Self-pay | Admitting: Intensive Care

## 2021-01-28 ENCOUNTER — Emergency Department: Payer: Medicaid Other

## 2021-01-28 ENCOUNTER — Emergency Department
Admission: EM | Admit: 2021-01-28 | Discharge: 2021-01-28 | Disposition: A | Discharge status: Home / Self Care | Payer: Medicaid Other | Attending: Emergency Medicine | Admitting: Emergency Medicine

## 2021-01-28 DIAGNOSIS — R569 Unspecified convulsions: Secondary | ICD-10-CM | POA: Insufficient documentation

## 2021-01-28 DIAGNOSIS — F17210 Nicotine dependence, cigarettes, uncomplicated: Secondary | ICD-10-CM | POA: Insufficient documentation

## 2021-01-28 DIAGNOSIS — Z7951 Long term (current) use of inhaled steroids: Secondary | ICD-10-CM | POA: Insufficient documentation

## 2021-01-28 DIAGNOSIS — J45909 Unspecified asthma, uncomplicated: Secondary | ICD-10-CM | POA: Insufficient documentation

## 2021-01-28 LAB — CBC WITH DIFFERENTIAL/PLATELET
Abs Immature Granulocytes: 0 10*3/uL (ref 0.00–0.07)
Basophils Absolute: 0.1 10*3/uL (ref 0.0–0.1)
Basophils Relative: 1 %
Eosinophils Absolute: 0.3 10*3/uL (ref 0.0–0.5)
Eosinophils Relative: 5 %
HCT: 40.6 % (ref 39.0–52.0)
Hemoglobin: 13.8 g/dL (ref 13.0–17.0)
Immature Granulocytes: 0 %
Lymphocytes Relative: 45 %
Lymphs Abs: 3.1 10*3/uL (ref 0.7–4.0)
MCH: 31.9 pg (ref 26.0–34.0)
MCHC: 34 g/dL (ref 30.0–36.0)
MCV: 94 fL (ref 80.0–100.0)
Monocytes Absolute: 0.5 10*3/uL (ref 0.1–1.0)
Monocytes Relative: 7 %
Neutro Abs: 2.9 10*3/uL (ref 1.7–7.7)
Neutrophils Relative %: 42 %
Platelets: 294 10*3/uL (ref 150–400)
RBC: 4.32 MIL/uL (ref 4.22–5.81)
RDW: 11.6 % (ref 11.5–15.5)
WBC: 6.8 10*3/uL (ref 4.0–10.5)
nRBC: 0 % (ref 0.0–0.2)

## 2021-01-28 LAB — COMPREHENSIVE METABOLIC PANEL
ALT: 14 U/L (ref 0–44)
AST: 19 U/L (ref 15–41)
Albumin: 4 g/dL (ref 3.5–5.0)
Alkaline Phosphatase: 56 U/L (ref 38–126)
Anion gap: 8 (ref 5–15)
BUN: 9 mg/dL (ref 6–20)
CO2: 29 mmol/L (ref 22–32)
Calcium: 9 mg/dL (ref 8.9–10.3)
Chloride: 102 mmol/L (ref 98–111)
Creatinine, Ser: 1.22 mg/dL (ref 0.61–1.24)
GFR, Estimated: >60 mL/min (ref >60)
Glucose, Bld: 98 mg/dL (ref 70–99)
Potassium: 4.3 mmol/L (ref 3.5–5.1)
Sodium: 139 mmol/L (ref 135–145)
Total Bilirubin: 0.7 mg/dL (ref 0.3–1.2)
Total Protein: 7.1 g/dL (ref 6.5–8.1)

## 2021-01-28 LAB — MAGNESIUM: Magnesium: 2.1 mg/dL (ref 1.7–2.4)

## 2021-01-28 LAB — PHENYTOIN LEVEL, TOTAL: Phenytoin Lvl: <2.5 ug/mL — ABNORMAL LOW (ref 10.0–20.0)

## 2021-01-28 MED ORDER — PHENYTOIN SODIUM EXTENDED 100 MG PO CAPS: 300.0000 mg | ORAL_CAPSULE | Freq: Every day | ORAL | 0 refills | Status: DC

## 2021-01-28 MED ORDER — PHENYTOIN SODIUM EXTENDED 300 MG PO CAPS
300.0000 mg | ORAL_CAPSULE | Freq: Every day | ORAL | 0 refills | Status: DC
  Filled 2021-01-28: qty 30, 30d supply, fill #0

## 2021-01-28 MED ORDER — PHENYTOIN SODIUM EXTENDED 300 MG PO CAPS
300.0000 mg | ORAL_CAPSULE | Freq: Every day | ORAL | 0 refills | Status: DC
Start: 2021-01-28 — End: 2021-01-28

## 2021-01-28 MED ORDER — LORAZEPAM 2 MG/ML IJ SOLN
1.0000 mg | Freq: Once | INTRAMUSCULAR | Status: AC
  Administered 2021-01-28: 1 mg via INTRAVENOUS
  Filled 2021-01-28: qty 1

## 2021-01-28 MED ORDER — LEVETIRACETAM IN NACL 1000 MG/100ML IV SOLN
1000.0000 mg | Freq: Once | INTRAVENOUS | Status: AC
  Administered 2021-01-28: 1000 mg via INTRAVENOUS
  Filled 2021-01-28: qty 100

## 2021-01-28 MED ORDER — SODIUM CHLORIDE 0.9 % IV SOLN
10.0000 mg/kg | Freq: Once | INTRAVENOUS | Status: AC
  Administered 2021-01-28: 815 mg via INTRAVENOUS
  Filled 2021-01-28: qty 16.3

## 2021-01-28 MED ORDER — PHENYTOIN SODIUM EXTENDED 100 MG PO CAPS
300.0000 mg | ORAL_CAPSULE | Freq: Every day | ORAL | Status: DC
  Filled 2021-01-28: qty 3
  Filled 2021-01-28: qty 6

## 2021-01-28 NOTE — Discharge Instructions (Addendum)
This could be epileptic versus nonepileptic seizures.  We will start you on your seizure medication at a higher dose to help prevent seizures.  Pharmacy is working on helping to provide it for you for a very low cost.  You need to call the neurology number to get follow-up outpatient.

## 2021-01-28 NOTE — ED Notes (Addendum)
Received Phenytoin 100 mg caps x 6 from pharmacy. Pills are bundled in 300 mg doses (3 pills) with directions to take one at bedtime on October 15th and the other at bedtime on October 16th. This was coordinated by Dagoberto Reef, RN and Dorothea Ogle, [facility]. Explained to patient how medication was to be taken, patient voiced his understanding.

## 2021-01-28 NOTE — ED Notes (Signed)
Dcv ppw provided. Pt given medicine for take home per MD, Pharm and RN verbals. pT IV removed. Pt assisted off unit via wheelchair

## 2021-01-28 NOTE — TOC Initial Note (Signed)
Transition of Care [facility]) - Initial/Assessment Note    Patient Details  Name: Martin Cooper MRN: [id number] Date of Birth: 14-Mar-1986  [facility] CM/SW Contact:    [place] Cellar, RN Phone Number: 01/28/2021, 2:46 PM  Clinical Narrative:                 Notified by EDP patient in need of medication assistance for new prescription of Phenytoin 300mg  Qhs. Spoke to patients aunt who confirmed patient typically gets his medication at med mgmt.   Outreached to [facility] in pharmacy to arrange patient to have 2 days worth delivered to bedside and patient will pick up remainder of presciption on Monday at med mgmt.         Patient Goals and CMS Choice        Expected Discharge Plan and Services                                                Prior Living Arrangements/Services                       Activities of Daily Living      Permission Sought/Granted                  Emotional Assessment              Admission diagnosis:  seizures There are no problems to display for this patient.  PCP:  Friday, MD Pharmacy:   [facility] [address] [place] - [address] [place]) [place] Phone: [phone] Fax: [phone]  [facility] [address] [place] Phone: [phone] Fax: [phone]     Social Determinants of Health (SDOH) Interventions    Readmission Risk Interventions No flowsheet data found.

## 2021-01-28 NOTE — ED Notes (Signed)
Patient called out. When this RN entered room he stated "I am having a seizure right now" Noted his right arm was shaking. Denied any other symptom. A&O x4. MD Fuller Plan made aware

## 2021-01-28 NOTE — ED Notes (Signed)
Spoke with gf, unable to pick patient up at this time. PT gf stating to call pts boss.

## 2021-01-28 NOTE — ED Notes (Signed)
X-ray at bedside

## 2021-01-28 NOTE — ED Notes (Signed)
Patient sitting up eating food provided. States he does not have anyone to come pick him up from hospital.
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
## 2021-01-28 NOTE — ED Provider Notes (Signed)
[facility] Emergency Department Provider Note  ____________________________________________   Event Date/Time   First MD Initiated Contact with Patient 01/28/21 [phone]     (approximate)  I have reviewed the triage vital signs and the nursing notes.   HISTORY  Chief Complaint Seizures    HPI Martin Cooper is a 35 y.o. male with history as of seizures who comes in with some seizure-like activity.  Patient was seen 2 days ago on review of records stating that he is compliant with Keppra but later clarifies that he is only taking it at nighttime. He comes in today because he says he has had multiple seizures reported by mother.  Report of three seizures but per EMS didn't really seem post ictal.  No seizure activity with EMS.  Patient reports that he did fall and hit his head.  Denies any neck pain.  He is reporting some pain in his bilateral hands.  Denies any other chest pain, abdominal pain or any other issues.          Past Medical History:  Diagnosis Date   Asthma    Seizures (HCC)     There are no problems to display for this patient.   History reviewed. No pertinent surgical history.  Prior to Admission medications   Medication Sig Start Date End Date Taking? Authorizing Provider  albuterol (PROVENTIL HFA;VENTOLIN HFA) 108 (90 Base) MCG/ACT inhaler Inhale 2 puffs into the lungs every 4 (four) hours as needed for wheezing or shortness of breath. 12/30/16   Cuthriell, Delorise Royals, PA-C  brompheniramine-pseudoephedrine-DM 30-2-10 MG/5ML syrup Take 10 mLs by mouth 4 (four) times daily as needed. 12/30/16   Cuthriell, Delorise Royals, PA-C  fluticasone (FLONASE) 50 MCG/ACT nasal spray Place 1 spray into both nostrils 2 (two) times daily. 12/30/16   Cuthriell, Delorise Royals, PA-C  levETIRAcetam (KEPPRA) 500 MG tablet Take 1 tablet (500 mg total) by mouth 2 (two) times daily. 01/26/21 02/25/21  Sharman Cheek, MD    Allergies Patient has no known  allergies.  History reviewed. No pertinent family history.  Social History Social History   Tobacco Use   Smoking status: Every Day    Packs/day: 0.50    Types: Cigarettes   Smokeless tobacco: Never  Substance Use Topics   Alcohol use: No   Drug use: No      Review of Systems Constitutional: No fever/chills Eyes: No visual changes. ENT: No sore throat. Cardiovascular: Denies chest pain. Respiratory: Denies shortness of breath. Gastrointestinal: No abdominal pain.  No nausea, no vomiting.  No diarrhea.  No constipation. Genitourinary: Negative for dysuria. Musculoskeletal: Negative for back pain.  Hand pain Skin: Negative for rash. Neurological: Negative for headaches, focal weakness or numbness.  Seizures All other ROS negative ____________________________________________   PHYSICAL EXAM:  VITAL SIGNS: ED Triage Vitals  Enc Vitals Group     BP 01/28/21 0729 111/80     Pulse Rate 01/28/21 0729 62     Resp 01/28/21 0729 13     Temp 01/28/21 0729 98.7 F (37.1 C)     Temp Source 01/28/21 0729 Oral     SpO2 01/28/21 0729 99 %     Weight 01/28/21 0731 180 lb (81.6 kg)     Height 01/28/21 0731 5\' 7"  (1.702 m)     Head Circumference --      Peak Flow --      Pain Score 01/28/21 0730 10     Pain Loc --  Pain Edu? --      Excl. in GC? --     Constitutional: Alert and oriented. Well appearing and in no acute distress. Eyes: Conjunctivae are normal. EOMI. Head: Atraumatic. Nose: No congestion/rhinnorhea. Mouth/Throat: Mucous membranes are moist.   Neck: No stridor. Trachea Midline. FROM Cardiovascular: Normal rate, regular rhythm. Grossly normal heart sounds.  Good peripheral circulation. Respiratory: Normal respiratory effort.  No retractions. Lungs CTAB. Gastrointestinal: Soft and nontender. No distention. No abdominal bruits.  Musculoskeletal: Patient reporting some pain in the back of his hands.  Some abrasions noted on his knuckles on the left hand.  No  snuffbox tenderness bilaterally.    Neurologic:  Normal speech and language. No gross focal neurologic deficits are appreciated.  Equal strength in arms and legs.  No deficits noted Skin:  Skin is warm, dry and intact. No rash noted. Psychiatric: Mood and affect are normal. Speech and behavior are normal. GU: Deferred   ____________________________________________   LABS (all labs ordered are listed, but only abnormal results are displayed)  Labs Reviewed  CBC WITH DIFFERENTIAL/PLATELET  COMPREHENSIVE METABOLIC PANEL  MAGNESIUM  LEVETIRACETAM LEVEL  URINALYSIS, ROUTINE W REFLEX MICROSCOPIC   ____________________________________________   ED ECG REPORT I, Concha Se, the attending physician, personally viewed and interpreted this ECG.  Sinus bradycardia rate of 58, no ST elevation, no T wave inversions, normal intervals, little bit of early repolarization, similar to prior EKG ____________________________________________  RADIOLOGY I, Concha Se, personally viewed and evaluated these images (plain radiographs) as part of my medical decision making, as well as reviewing the written report by the radiologist.  ED MD interpretation: No fractures.  Official radiology report(s): CT HEAD WO CONTRAST ( )  Result Date: 01/28/2021 CLINICAL DATA:  Seizure, trauma EXAM: CT HEAD WITHOUT CONTRAST TECHNIQUE: Contiguous axial images were obtained from the base of the skull through the vertex without intravenous contrast. COMPARISON:  CT head 01/26/2021 FINDINGS: Brain: There is no acute intracranial hemorrhage, extra-axial fluid collection, or acute infarct. Parenchymal volume is normal. The ventricles are normal in size. There is no mass lesion. There is no midline shift. Vascular: No hyperdense vessel or unexpected calcification. Skull: Normal. Negative for fracture or focal lesion. Sinuses/Orbits: The imaged paranasal sinuses are clear. The globes and orbits are unremarkable. Other:  There is a remote fracture of the right zygomatic arch, unchanged. IMPRESSION: No acute intracranial pathology or epileptogenic focus identified. Electronically Signed   By: Lesia Hausen M.D.   On: 01/28/2021 08:13   DG Hand Complete Left  Result Date: 01/28/2021 CLINICAL DATA:  Fall EXAM: LEFT HAND - COMPLETE 3+ VIEW COMPARISON:  None. FINDINGS: There is no acute fracture or dislocation. Bony alignment is normal. The joint spaces are preserved. There are no erosive changes. The soft tissues are unremarkable. IMPRESSION: No acute fracture or dislocation. Electronically Signed   By: Lesia Hausen M.D.   On: 01/28/2021 08:10   DG Hand Complete Right  Result Date: 01/28/2021 CLINICAL DATA:  Seizure with hand pain EXAM: RIGHT HAND - COMPLETE 3 VIEW COMPARISON:  06/14/2013 FINDINGS: Fifth metacarpal plating which healed underlying fracture. Small calcific density at the index finger; no history of acute penetrating injury. No acute fracture or dislocation. IMPRESSION: Negative for acute fracture or malalignment. Electronically Signed   By: Tiburcio Pea M.D.   On: 01/28/2021 08:12    ____________________________________________   PROCEDURES  Procedure(s) performed (including Critical Care):  Procedures   ____________________________________________   INITIAL IMPRESSION / ASSESSMENT AND PLAN / ED  COURSE  HANSFORD HIRT was evaluated in Emergency Department on 01/28/2021 for the symptoms described in the history of present illness. He was evaluated in the context of the global COVID-19 pandemic, which necessitated consideration that the patient might be at risk for infection with the SARS-CoV-2 virus that causes COVID-19. Institutional protocols and algorithms that pertain to the evaluation of patients at risk for COVID-19 are in a state of rapid change based on information released by regulatory bodies including the CDC and federal and state organizations. These policies and algorithms were  followed during the patient's care in the ED.    Patient comes in with some concerns for seizures.  On review of records typically his seizures are secondary to medication noncompliance as he had 2 days ago.  Unclear if there epileptic seizures versus pseudoseizures given the report of no postictal period.  We will give a loading dose of IV Keppra.  Patient states he has had so get a CT head to evaluate for intercranial hemorrhage.  No C-spine tenderness to suggest cervical fracture.  Given his abrasions on the back of his hands we will get x-rays to rule out any fractures.  Will get labs to evaluate for any electrolyte abnormalities, AKI.  After I am seeing patient he states to me I feel like I need to stay in the hospital overnight because of my seizures.  Labs are reassuring and around baseline.  CT imaging and x-rays are negative.  9:03 AM I called patient's girlfriend who supposedly witnessed these events happening and she stated that she is not sure how long it happened for but it was his whole body shaking while he was on the couch.  She is not the best historian either.  She is able to read the bottle to me but he has been taking has been phenytoin 100 mg at nighttime.  They were unable to pick up the Keppra from a few nights ago.  Patient states that he had another seizure while in the room but nobody witnessed this so not sure if this was a true seizure.  9:38 AM Patient stated that he had another seizure at the nurse was able to go in and witnessed that.  He had some tremor of the right arm and stated that he was having a seizure but he was fully alert and having a full conversation with him.  Consider partial seizure versus pseudoseizure  9:53 AM discussed the case with Dr. Amada Jupiter who recommended getting a phenytoin level and then loading him based upon his phenytoin level and increasing his phenytoin to 300 mg at nighttime.  I I did let them know that we already gave him a gram of Keppra  and has had 2 of these questionable seizures since then we recommended giving a milligram of Ativan.  We do not have EEG capabilities on the weekend and see if these can be well controlled we could potentially send him home versus need to be transferred  10:04 AM when I went into the room to update the patient he was resting comfortably without any shaking but then he started to shake from his left arm, opposite of where he was shaking previously witnessed by the nurse it was the right arm.  He stated that he is still having a seizure.  Patient is alert and able to talk to me.  2:50 PM patient reports not having any seizures for a long period of time.  We will let him eat.  They  are working on getting his medication for the phenytoin for at nighttime filled by social work.  At this time however I feel he patient is cleared for discharge home given I suspect that some of this could be pseudoseizures given his switching between his right and his left arm shaking and if it is real seizures he has not been taking his medicine so I suspect that with the phenytoin bolus that it would prevent any real epileptic seizures as well             ____________________________________________   FINAL CLINICAL IMPRESSION(S) / ED DIAGNOSES   Final diagnoses:  Seizure-like activity (HCC)      MEDICATIONS GIVEN DURING THIS VISIT:  Medications  levETIRAcetam (KEPPRA) IVPB 1000 mg/100 mL premix (0 mg Intravenous Stopped 01/28/21 1018)  LORazepam (ATIVAN) injection 1 mg (1 mg Intravenous Given 01/28/21 1025)  phenytoin (DILANTIN) 815 mg in sodium chloride 0.9 % 250 mL IVPB (0 mg Intravenous Stopped 01/28/21 1400)     ED Discharge Orders          Ordered    phenytoin (DILANTIN) 300 MG ER capsule  Daily at bedtime        01/28/21 1348    phenytoin (DILANTIN) 100 MG ER capsule  Daily at bedtime        01/28/21 1445             Note:  This document was prepared using Dragon voice recognition  software and may include unintentional dictation errors.    Concha Se, MD 01/28/21 [id number]

## 2021-01-28 NOTE — ED Notes (Signed)
Called pts boss Manny for ride. Eta 25-30 mins.

## 2021-01-29 ENCOUNTER — Emergency Department: Payer: Self-pay

## 2021-01-29 ENCOUNTER — Emergency Department
Admission: EM | Admit: 2021-01-29 | Discharge: 2021-01-30 | Disposition: A | Discharge status: Home / Self Care | Payer: Self-pay | Attending: Emergency Medicine | Admitting: Emergency Medicine

## 2021-01-29 ENCOUNTER — Other Ambulatory Visit: Payer: Self-pay

## 2021-01-29 DIAGNOSIS — X501XXA Overexertion from prolonged static or awkward postures, initial encounter: Secondary | ICD-10-CM | POA: Insufficient documentation

## 2021-01-29 DIAGNOSIS — R7309 Other abnormal glucose: Secondary | ICD-10-CM | POA: Insufficient documentation

## 2021-01-29 DIAGNOSIS — S93401A Sprain of unspecified ligament of right ankle, initial encounter: Secondary | ICD-10-CM | POA: Insufficient documentation

## 2021-01-29 DIAGNOSIS — J45909 Unspecified asthma, uncomplicated: Secondary | ICD-10-CM | POA: Insufficient documentation

## 2021-01-29 DIAGNOSIS — F17210 Nicotine dependence, cigarettes, uncomplicated: Secondary | ICD-10-CM | POA: Insufficient documentation

## 2021-01-29 DIAGNOSIS — Y92009 Unspecified place in unspecified non-institutional (private) residence as the place of occurrence of the external cause: Secondary | ICD-10-CM | POA: Insufficient documentation

## 2021-01-29 LAB — CBG MONITORING, ED: Glucose-Capillary: 86 mg/dL (ref 70–99)

## 2021-01-29 NOTE — ED Triage Notes (Signed)
Pt presents via EMS from home due to a right ankle injury after coming down some stairs. He heard a "pop" to his right ankle and is not unable to ambulate. Denies any other complaints at this time.

## 2021-01-30 ENCOUNTER — Other Ambulatory Visit: Payer: Self-pay

## 2021-01-30 LAB — LEVETIRACETAM LEVEL: Levetiracetam Lvl: <2 ug/mL — ABNORMAL LOW (ref 10.0–40.0)

## 2021-01-30 MED ORDER — KETOROLAC TROMETHAMINE 30 MG/ML IJ SOLN: 30.0000 mg | Freq: Once | INTRAMUSCULAR | Status: DC

## 2021-01-30 NOTE — ED Provider Notes (Signed)
[facility] Emergency Department Provider Note   ____________________________________________   Event Date/Time   First MD Initiated Contact with Patient 01/29/21 2347     (approximate)  I have reviewed the triage vital signs and the nursing notes.   HISTORY  Chief Complaint Ankle Pain (Right )    HPI Martin Cooper is a 35 y.o. male with possible history of seizures and asthma who presents to the ED complaining of ankle pain.  Patient reports that he was taking out the trash when he he felt his ankle roll with a pop.  He reports significant pain in his right ankle since then with difficulty bearing weight on his right foot due to the pain.  He has not taken anything for pain prior to arrival.  He has not noticed any swelling or skin discoloration.  He denies any prior injuries to his ankle.        Past Medical History:  Diagnosis Date   Asthma    Seizures (HCC)     There are no problems to display for this patient.   No past surgical history on file.  Prior to Admission medications   Medication Sig Start Date End Date Taking? Authorizing Provider  albuterol (PROVENTIL HFA;VENTOLIN HFA) 108 (90 Base) MCG/ACT inhaler Inhale 2 puffs into the lungs every 4 (four) hours as needed for wheezing or shortness of breath. 12/30/16   Cuthriell, Delorise Royals, PA-C  brompheniramine-pseudoephedrine-DM 30-2-10 MG/5ML syrup Take 10 mLs by mouth 4 (four) times daily as needed. 12/30/16   Cuthriell, Delorise Royals, PA-C  fluticasone (FLONASE) 50 MCG/ACT nasal spray Place 1 spray into both nostrils 2 (two) times daily. 12/30/16   Cuthriell, Delorise Royals, PA-C  levETIRAcetam (KEPPRA) 500 MG tablet Take 1 tablet (500 mg total) by mouth 2 (two) times daily. 01/26/21 02/25/21  Sharman Cheek, MD  phenytoin (DILANTIN) 100 MG ER capsule Take 3 capsules (300 mg total) by mouth at bedtime for 2 days. 01/28/21 01/30/21  Concha Se, MD  phenytoin (DILANTIN) 100 MG ER capsule Take 3  capsules (300 mg total) by mouth at bedtime for 2 days. 01/28/21 01/30/21  Concha Se, MD  phenytoin (DILANTIN) 300 MG ER capsule Take 1 capsule (300 mg total) by mouth at bedtime. 01/28/21 02/27/21  Concha Se, MD    Allergies Patient has no known allergies.  No family history on file.  Social History Social History   Tobacco Use   Smoking status: Every Day    Packs/day: 0.50    Types: Cigarettes   Smokeless tobacco: Never  Substance Use Topics   Alcohol use: No   Drug use: No    Review of Systems  Constitutional: No fever/chills Eyes: No visual changes. ENT: No sore throat. Cardiovascular: Denies chest pain. Respiratory: Denies shortness of breath. Gastrointestinal: No abdominal pain.  No nausea, no vomiting.  No diarrhea.  No constipation. Genitourinary: Negative for dysuria. Musculoskeletal: Negative for back pain.  Positive for right ankle pain. Skin: Negative for rash. Neurological: Negative for headaches, focal weakness or numbness.  ____________________________________________   PHYSICAL EXAM:  VITAL SIGNS: ED Triage Vitals [01/29/21 1928]  Enc Vitals Group     BP 136/85     Pulse Rate 75     Resp 20     Temp 98.5 F (36.9 C)     Temp Source Oral     SpO2 98 %     Weight 180 lb (81.6 kg)     Height 5'  5" (1.651 m)     Head Circumference      Peak Flow      Pain Score 10     Pain Loc      Pain Edu?      Excl. in GC?     Constitutional: Alert and oriented. Eyes: Conjunctivae are normal. Head: Atraumatic. Nose: No congestion/rhinnorhea. Mouth/Throat: Mucous membranes are moist. Neck: Normal ROM Cardiovascular: Normal rate, regular rhythm. Grossly normal heart sounds.  2+ DP pulse on right. Respiratory: Normal respiratory effort.  No retractions. Lungs CTAB. Gastrointestinal: Soft and nontender. No distention. Genitourinary: deferred Musculoskeletal: Tenderness to palpation at right medial malleolus as well as Achilles tendon.  No  obvious deformities noted. Neurologic:  Normal speech and language. No gross focal neurologic deficits are appreciated. Skin:  Skin is warm, dry and intact. No rash noted. Psychiatric: Mood and affect are normal. Speech and behavior are normal.  ____________________________________________   LABS (all labs ordered are listed, but only abnormal results are displayed)  Labs Reviewed  CBG MONITORING, ED     PROCEDURES  Procedure(s) performed (including Critical Care):  Procedures   ____________________________________________   INITIAL IMPRESSION / ASSESSMENT AND PLAN / ED COURSE      35 year old male with past medical history of seizures and asthma who presents to the ED complaining of right ankle pain after rolling the ankle and feeling a pop.  He is neurovascularly intact to his right lower extremity, no obvious deformities noted on exam.  X-ray reviewed by me and shows no fracture or dislocation at the right ankle.  Suspect ankle sprain and we will place patient in brace, treat symptomatically with IM Toradol.  He was counseled to continue NSAIDs at home and to follow-up with his PCP, otherwise return to the ED for new worsening symptoms.  Patient agrees with plan.      ____________________________________________   FINAL CLINICAL IMPRESSION(S) / ED DIAGNOSES  Final diagnoses:  Sprain of right ankle, unspecified ligament, initial encounter     ED Discharge Orders     None        Note:  This document was prepared using Dragon voice recognition software and may include unintentional dictation errors.    Chesley Noon, MD 01/30/21 0010

## 2021-01-31 ENCOUNTER — Other Ambulatory Visit: Payer: Self-pay

## 2021-02-06 ENCOUNTER — Emergency Department
Admission: EM | Admit: 2021-02-06 | Discharge: 2021-02-06 | Disposition: A | Discharge status: Home / Self Care | Payer: Medicaid Other | Attending: Emergency Medicine | Admitting: Emergency Medicine

## 2021-02-06 ENCOUNTER — Encounter: Payer: Self-pay | Admitting: *Deleted

## 2021-02-06 ENCOUNTER — Emergency Department
Admission: EM | Admit: 2021-02-06 | Discharge: 2021-02-06 | Disposition: A | Discharge status: Home / Self Care | Payer: Self-pay | Attending: Emergency Medicine | Admitting: Emergency Medicine

## 2021-02-06 ENCOUNTER — Emergency Department: Payer: Self-pay

## 2021-02-06 ENCOUNTER — Emergency Department: Payer: Medicaid Other

## 2021-02-06 ENCOUNTER — Other Ambulatory Visit: Payer: Self-pay

## 2021-02-06 DIAGNOSIS — J069 Acute upper respiratory infection, unspecified: Secondary | ICD-10-CM | POA: Insufficient documentation

## 2021-02-06 DIAGNOSIS — R112 Nausea with vomiting, unspecified: Secondary | ICD-10-CM | POA: Diagnosis not present

## 2021-02-06 DIAGNOSIS — Z8669 Personal history of other diseases of the nervous system and sense organs: Secondary | ICD-10-CM | POA: Diagnosis not present

## 2021-02-06 DIAGNOSIS — J45909 Unspecified asthma, uncomplicated: Secondary | ICD-10-CM | POA: Diagnosis not present

## 2021-02-06 DIAGNOSIS — Z5321 Procedure and treatment not carried out due to patient leaving prior to being seen by health care provider: Secondary | ICD-10-CM | POA: Insufficient documentation

## 2021-02-06 DIAGNOSIS — R051 Acute cough: Secondary | ICD-10-CM | POA: Diagnosis present

## 2021-02-06 DIAGNOSIS — R0602 Shortness of breath: Secondary | ICD-10-CM | POA: Insufficient documentation

## 2021-02-06 DIAGNOSIS — Z20822 Contact with and (suspected) exposure to covid-19: Secondary | ICD-10-CM | POA: Diagnosis not present

## 2021-02-06 DIAGNOSIS — R569 Unspecified convulsions: Secondary | ICD-10-CM | POA: Insufficient documentation

## 2021-02-06 DIAGNOSIS — R079 Chest pain, unspecified: Secondary | ICD-10-CM | POA: Insufficient documentation

## 2021-02-06 DIAGNOSIS — F17210 Nicotine dependence, cigarettes, uncomplicated: Secondary | ICD-10-CM | POA: Diagnosis not present

## 2021-02-06 LAB — HEPATIC FUNCTION PANEL
ALT: 18 U/L (ref 0–44)
AST: 23 U/L (ref 15–41)
Albumin: 4.6 g/dL (ref 3.5–5.0)
Alkaline Phosphatase: 67 U/L (ref 38–126)
Bilirubin, Direct: 0.1 mg/dL (ref 0.0–0.2)
Indirect Bilirubin: 0.6 mg/dL (ref 0.3–0.9)
Total Bilirubin: 0.7 mg/dL (ref 0.3–1.2)
Total Protein: 8 g/dL (ref 6.5–8.1)

## 2021-02-06 LAB — URINALYSIS, COMPLETE (UACMP) WITH MICROSCOPIC
Bacteria, UA: NONE SEEN
Bilirubin Urine: NEGATIVE
Glucose, UA: NEGATIVE mg/dL
Hgb urine dipstick: NEGATIVE
Ketones, ur: NEGATIVE mg/dL
Leukocytes,Ua: NEGATIVE
Nitrite: NEGATIVE
Protein, ur: 30 mg/dL — AB
Specific Gravity, Urine: 1.026 (ref 1.005–1.030)
pH: 7 (ref 5.0–8.0)

## 2021-02-06 LAB — BASIC METABOLIC PANEL
Anion gap: 8 (ref 5–15)
Anion gap: 6 (ref 5–15)
BUN: 9 mg/dL (ref 6–20)
BUN: 8 mg/dL (ref 6–20)
CO2: 27 mmol/L (ref 22–32)
CO2: 30 mmol/L (ref 22–32)
Calcium: 9 mg/dL (ref 8.9–10.3)
Calcium: 9.1 mg/dL (ref 8.9–10.3)
Chloride: 102 mmol/L (ref 98–111)
Chloride: 104 mmol/L (ref 98–111)
Creatinine, Ser: 1.2 mg/dL (ref 0.61–1.24)
Creatinine, Ser: 1.18 mg/dL (ref 0.61–1.24)
GFR, Estimated: >60 mL/min (ref >60)
GFR, Estimated: >60 mL/min (ref >60)
Glucose, Bld: 93 mg/dL (ref 70–99)
Glucose, Bld: 101 mg/dL — ABNORMAL HIGH (ref 70–99)
Potassium: 3.5 mmol/L (ref 3.5–5.1)
Potassium: 3.8 mmol/L (ref 3.5–5.1)
Sodium: 137 mmol/L (ref 135–145)
Sodium: 140 mmol/L (ref 135–145)

## 2021-02-06 LAB — LIPASE, BLOOD: Lipase: 23 U/L (ref 11–51)

## 2021-02-06 LAB — CBC
HCT: 38.5 % — ABNORMAL LOW (ref 39.0–52.0)
HCT: 41.5 % (ref 39.0–52.0)
Hemoglobin: 13.3 g/dL (ref 13.0–17.0)
Hemoglobin: 14.1 g/dL (ref 13.0–17.0)
MCH: 32.3 pg (ref 26.0–34.0)
MCH: 31.9 pg (ref 26.0–34.0)
MCHC: 34.5 g/dL (ref 30.0–36.0)
MCHC: 34 g/dL (ref 30.0–36.0)
MCV: 93.4 fL (ref 80.0–100.0)
MCV: 93.9 fL (ref 80.0–100.0)
Platelets: 258 10*3/uL (ref 150–400)
Platelets: 286 10*3/uL (ref 150–400)
RBC: 4.12 MIL/uL — ABNORMAL LOW (ref 4.22–5.81)
RBC: 4.42 MIL/uL (ref 4.22–5.81)
RDW: 11.5 % (ref 11.5–15.5)
RDW: 11.5 % (ref 11.5–15.5)
WBC: 9.7 10*3/uL (ref 4.0–10.5)
WBC: 8.4 10*3/uL (ref 4.0–10.5)
nRBC: 0 % (ref 0.0–0.2)
nRBC: 0 % (ref 0.0–0.2)

## 2021-02-06 LAB — RESP PANEL BY RT-PCR (FLU A&B, COVID) ARPGX2
Influenza A by PCR: NEGATIVE
Influenza B by PCR: NEGATIVE
SARS Coronavirus 2 by RT PCR: NEGATIVE

## 2021-02-06 LAB — TROPONIN I (HIGH SENSITIVITY)
Troponin I (High Sensitivity): 4 ng/L (ref <18)
Troponin I (High Sensitivity): 4 ng/L (ref <18)
Troponin I (High Sensitivity): 5 ng/L (ref <18)

## 2021-02-06 MED ORDER — SODIUM CHLORIDE 0.9 % IV BOLUS
1000.0000 mL | Freq: Once | INTRAVENOUS | Status: AC
  Administered 2021-02-06: 1000 mL via INTRAVENOUS

## 2021-02-06 MED ORDER — ONDANSETRON 4 MG PO TBDP: 4.0000 mg | ORAL_TABLET | Freq: Three times a day (TID) | ORAL | 0 refills | Status: DC | PRN

## 2021-02-06 MED ORDER — AZITHROMYCIN 250 MG PO TABS: ORAL_TABLET | ORAL | 0 refills | Status: AC

## 2021-02-06 MED ORDER — KETOROLAC TROMETHAMINE 30 MG/ML IJ SOLN
15.0000 mg | Freq: Once | INTRAMUSCULAR | Status: AC
  Administered 2021-02-06: 15 mg via INTRAVENOUS
  Filled 2021-02-06: qty 1

## 2021-02-06 MED ORDER — GUAIFENESIN-CODEINE 100-10 MG/5ML PO SOLN: 5.0000 mL | Freq: Four times a day (QID) | ORAL | 0 refills | Status: DC | PRN

## 2021-02-06 MED ORDER — ONDANSETRON HCL 4 MG/2ML IJ SOLN
4.0000 mg | Freq: Once | INTRAMUSCULAR | Status: AC
  Administered 2021-02-06: 4 mg via INTRAVENOUS
  Filled 2021-02-06: qty 2

## 2021-02-06 NOTE — ED Provider Notes (Signed)
[facility] Emergency Department Provider Note  Time seen: 3:40 PM  I have reviewed the triage vital signs and the nursing notes.   HISTORY  Chief Complaint Cough, Chest Pain, and Emesis   HPI Martin Cooper is a 35 y.o. male with a past medical history of asthma, seizure disorder, presents to the emergency department for cough nausea vomiting.  According to the patient over the past 3 days he has been coughing with occasional episodes of vomiting after eating.  He has noted some blood streaked in his vomit as well.  States he has been coughing denies any significant sputum production, contrary to triage note denies any blood in the respiratory sputum.  States he has not been able to eat or drink much over the past 3 days due to nausea and vomiting.  Patient does have an occasional cough during examination.  Denies any chest pain.  Denies any abdominal pain.  Past Medical History:  Diagnosis Date   Asthma    Seizures (HCC)     There are no problems to display for this patient.   No past surgical history on file.  Prior to Admission medications   Medication Sig Start Date End Date Taking? Authorizing Provider  albuterol (PROVENTIL HFA;VENTOLIN HFA) 108 (90 Base) MCG/ACT inhaler Inhale 2 puffs into the lungs every 4 (four) hours as needed for wheezing or shortness of breath. 12/30/16   Cuthriell, Delorise Royals, PA-C  brompheniramine-pseudoephedrine-DM 30-2-10 MG/5ML syrup Take 10 mLs by mouth 4 (four) times daily as needed. 12/30/16   Cuthriell, Delorise Royals, PA-C  fluticasone (FLONASE) 50 MCG/ACT nasal spray Place 1 spray into both nostrils 2 (two) times daily. 12/30/16   Cuthriell, Delorise Royals, PA-C  levETIRAcetam (KEPPRA) 500 MG tablet Take 1 tablet (500 mg total) by mouth 2 (two) times daily. 01/26/21 02/25/21  Sharman Cheek, MD  phenytoin (DILANTIN) 100 MG ER capsule Take 3 capsules (300 mg total) by mouth at bedtime for 2 days. 01/28/21 01/30/21  Concha Se,  MD  phenytoin (DILANTIN) 100 MG ER capsule Take 3 capsules (300 mg total) by mouth at bedtime for 2 days. 01/28/21 01/30/21  Concha Se, MD  phenytoin (DILANTIN) 300 MG ER capsule Take 1 capsule (300 mg total) by mouth once daily at bedtime. 01/28/21 02/27/21  Concha Se, MD    No Known Allergies  No family history on file.  Social History Social History   Tobacco Use   Smoking status: Every Day    Packs/day: 0.50    Types: Cigarettes   Smokeless tobacco: Never  Substance Use Topics   Alcohol use: No   Drug use: No    Review of Systems Constitutional: Chills at home.  Generalized fatigue. Cardiovascular: Negative for chest pain. Respiratory: Negative for shortness of breath.  Positive for cough. Gastrointestinal: Negative for abdominal pain.  Positive for nausea and vomiting with occasional bloody streaks. Genitourinary: Negative for urinary compaints Musculoskeletal: Negative for musculoskeletal complaints Neurological: Negative for headache All other ROS negative  ____________________________________________   PHYSICAL EXAM:  VITAL SIGNS: ED Triage Vitals  Enc Vitals Group     BP 02/06/21 1343 131/84     Pulse Rate 02/06/21 1343 83     Resp 02/06/21 1343 20     Temp 02/06/21 1343 99.7 F (37.6 C)     Temp Source 02/06/21 1343 Oral     SpO2 02/06/21 1343 98 %     Weight 02/06/21 1344 180 lb (81.6 kg)  Height 02/06/21 1344 5\' 5"  (1.651 m)     Head Circumference --      Peak Flow --      Pain Score 02/06/21 1344 10     Pain Loc --      Pain Edu? --      Excl. in GC? --    Constitutional: Alert and oriented. Well appearing and in no distress. Eyes: Normal exam ENT      Head: Normocephalic and atraumatic.      Mouth/Throat: Mucous membranes are moist. Cardiovascular: Normal rate, regular rhythm.  Respiratory: Normal respiratory rate, no respiratory distress.  Patient does have mild rhonchi bilaterally on examination however this largely clears with  coughing.   Gastrointestinal: Soft and nontender. No distention.  Musculoskeletal: Nontender with normal range of motion in all extremities.  Neurologic:  Normal speech and language. No gross focal neurologic deficits  Skin:  Skin is warm, dry and intact.  Psychiatric: Mood and affect are normal.   ____________________________________________    EKG  EKG viewed and interpreted by myself shows a normal sinus rhythm at 63 bpm with a narrow QRS, normal axis, normal intervals, no concerning ST changes.  ____________________________________________    RADIOLOGY  Chest x-ray is normal  ____________________________________________   INITIAL IMPRESSION / ASSESSMENT AND PLAN / ED COURSE  Pertinent labs & imaging results that were available during my care of the patient were reviewed by me and considered in my medical decision making (see chart for details).   Patient presents emergency department for cough with nausea vomiting occasional blood-streaked vomit.  Reassuringly patient appears well with largely reassuring vital signs temperature is 99.7.  Patient does have mild rhonchi on exam but largely clears with coughing.  Patient has a benign abdominal exam.  Reassuring lab work thus far.  I have added on the hepatic function panel and lipase as a precaution as well as a COVID swab and a urine sample.  We will IV hydrate, treat nausea and continue to closely monitor.  Patient agreeable to plan of care.  Work-up is very reassuring.  Patient continues to have a frequent cough in the emergency department.  We will cover with antibiotics for possible bronchitis we will discharge with Zithromax, cough medication and Zofran to be used if needed for nausea.  Patient is currently eating and drinking in the emergency department without issue.  Provided return precautions.  Martin Cooper was evaluated in Emergency Department on 02/06/2021 for the symptoms described in the history of present illness.  He was evaluated in the context of the global COVID-19 pandemic, which necessitated consideration that the patient might be at risk for infection with the SARS-CoV-2 virus that causes COVID-19. Institutional protocols and algorithms that pertain to the evaluation of patients at risk for COVID-19 are in a state of rapid change based on information released by regulatory bodies including the CDC and federal and state organizations. These policies and algorithms were followed during the patient's care in the ED.  ____________________________________________   FINAL CLINICAL IMPRESSION(S) / ED DIAGNOSES  Cough Nausea vomiting Upper respiratory infection   02/08/2021, MD 02/06/21 02/08/21

## 2021-02-06 NOTE — ED Triage Notes (Signed)
Pt states that he has been coughing, and coughing up blood, states vomiting and reports vomiting blood with chest pain for 3 days. Pt states that he can't keep anything down on his stomach

## 2021-02-06 NOTE — ED Triage Notes (Addendum)
Pt to ED via EMS from home reporting centralized chest pain radiating into his back and worsening shortness of breath since a seizure 2 days ago. Hx of seizures but pt reports he ran out of medication.   Pt reports nausea and vomiting x 2 days also

## 2021-02-10 ENCOUNTER — Encounter: Payer: Self-pay | Admitting: Emergency Medicine

## 2021-02-10 ENCOUNTER — Other Ambulatory Visit: Payer: Self-pay

## 2021-02-10 ENCOUNTER — Emergency Department
Admission: EM | Admit: 2021-02-10 | Discharge: 2021-02-10 | Disposition: A | Discharge status: Home / Self Care | Payer: Medicaid Other | Attending: Emergency Medicine | Admitting: Emergency Medicine

## 2021-02-10 DIAGNOSIS — G40909 Epilepsy, unspecified, not intractable, without status epilepticus: Secondary | ICD-10-CM | POA: Diagnosis not present

## 2021-02-10 DIAGNOSIS — J45909 Unspecified asthma, uncomplicated: Secondary | ICD-10-CM | POA: Insufficient documentation

## 2021-02-10 DIAGNOSIS — F17210 Nicotine dependence, cigarettes, uncomplicated: Secondary | ICD-10-CM | POA: Diagnosis not present

## 2021-02-10 DIAGNOSIS — R569 Unspecified convulsions: Secondary | ICD-10-CM

## 2021-02-10 LAB — URINE DRUG SCREEN, QUALITATIVE (ARMC ONLY)
Amphetamines, Ur Screen: NOT DETECTED
Barbiturates, Ur Screen: NOT DETECTED
Benzodiazepine, Ur Scrn: NOT DETECTED
Cannabinoid 50 Ng, Ur ~~LOC~~: POSITIVE — AB
Cocaine Metabolite,Ur ~~LOC~~: POSITIVE — AB
MDMA (Ecstasy)Ur Screen: NOT DETECTED
Methadone Scn, Ur: NOT DETECTED
Opiate, Ur Screen: NOT DETECTED
Phencyclidine (PCP) Ur S: NOT DETECTED
Tricyclic, Ur Screen: NOT DETECTED

## 2021-02-10 LAB — COMPREHENSIVE METABOLIC PANEL
ALT: 19 U/L (ref 0–44)
AST: 23 U/L (ref 15–41)
Albumin: 3.9 g/dL (ref 3.5–5.0)
Alkaline Phosphatase: 57 U/L (ref 38–126)
Anion gap: 5 (ref 5–15)
BUN: 7 mg/dL (ref 6–20)
CO2: 27 mmol/L (ref 22–32)
Calcium: 9.3 mg/dL (ref 8.9–10.3)
Chloride: 107 mmol/L (ref 98–111)
Creatinine, Ser: 1.13 mg/dL (ref 0.61–1.24)
GFR, Estimated: >60 mL/min (ref >60)
Glucose, Bld: 92 mg/dL (ref 70–99)
Potassium: 4.3 mmol/L (ref 3.5–5.1)
Sodium: 139 mmol/L (ref 135–145)
Total Bilirubin: 0.8 mg/dL (ref 0.3–1.2)
Total Protein: 7.2 g/dL (ref 6.5–8.1)

## 2021-02-10 LAB — CBC WITH DIFFERENTIAL/PLATELET
Abs Immature Granulocytes: 0.01 10*3/uL (ref 0.00–0.07)
Basophils Absolute: 0 10*3/uL (ref 0.0–0.1)
Basophils Relative: 1 %
Eosinophils Absolute: 0.2 10*3/uL (ref 0.0–0.5)
Eosinophils Relative: 4 %
HCT: 40.6 % (ref 39.0–52.0)
Hemoglobin: 13.8 g/dL (ref 13.0–17.0)
Immature Granulocytes: 0 %
Lymphocytes Relative: 43 %
Lymphs Abs: 2.3 10*3/uL (ref 0.7–4.0)
MCH: 31.2 pg (ref 26.0–34.0)
MCHC: 34 g/dL (ref 30.0–36.0)
MCV: 91.6 fL (ref 80.0–100.0)
Monocytes Absolute: 0.5 10*3/uL (ref 0.1–1.0)
Monocytes Relative: 10 %
Neutro Abs: 2.1 10*3/uL (ref 1.7–7.7)
Neutrophils Relative %: 42 %
Platelets: 314 10*3/uL (ref 150–400)
RBC: 4.43 MIL/uL (ref 4.22–5.81)
RDW: 11.3 % — ABNORMAL LOW (ref 11.5–15.5)
WBC: 5.1 10*3/uL (ref 4.0–10.5)
nRBC: 0 % (ref 0.0–0.2)

## 2021-02-10 LAB — ETHANOL: Alcohol, Ethyl (B): <10 mg/dL (ref <10)

## 2021-02-10 MED ORDER — SODIUM CHLORIDE 0.9 % IV BOLUS
1000.0000 mL | Freq: Once | INTRAVENOUS | Status: AC
  Administered 2021-02-10: 1000 mL via INTRAVENOUS

## 2021-02-10 MED ORDER — LEVETIRACETAM 500 MG PO TABS
500.0000 mg | ORAL_TABLET | Freq: Once | ORAL | Status: AC
  Administered 2021-02-10: 500 mg via ORAL
  Filled 2021-02-10: qty 1

## 2021-02-10 MED ORDER — LEVETIRACETAM 500 MG PO TABS: 500.0000 mg | ORAL_TABLET | Freq: Two times a day (BID) | ORAL | 0 refills | Status: DC

## 2021-02-10 MED ORDER — PHENYTOIN SODIUM EXTENDED 300 MG PO CAPS
300.0000 mg | ORAL_CAPSULE | Freq: Every day | ORAL | 0 refills | Status: DC
Start: 2021-02-10 — End: 2021-02-12

## 2021-02-10 MED ORDER — SODIUM CHLORIDE 0.9 % IV SOLN
1500.0000 mg | Freq: Once | INTRAVENOUS | Status: DC
  Filled 2021-02-10: qty 30

## 2021-02-10 MED ORDER — SODIUM CHLORIDE 0.9 % IV SOLN
1500.0000 mg | Freq: Once | INTRAVENOUS | Status: AC
  Administered 2021-02-10: 1500 mg via INTRAVENOUS
  Filled 2021-02-10: qty 30

## 2021-02-10 NOTE — ED Notes (Signed)
This RN attempted to call this pt's emergency contact per MD request. No answer.

## 2021-02-10 NOTE — ED Triage Notes (Signed)
Pt to ED via [facility] from home for seizures. Pt has had 2 witnessed seizures this morning. Per EMS pt and family reported that seizures lasted 30-45 minutes. Pt reports that he is out of his medications and has not been able to get into a doctor to get his medication. Pt is alert at this time. Pt is in NAD.

## 2021-02-10 NOTE — ED Provider Notes (Addendum)
[facility] Emergency Department Provider Note  ____________________________________________   Event Date/Time   First MD Initiated Contact with Patient 02/10/21 [phone]     (approximate)  I have reviewed the triage vital signs and the nursing notes.   HISTORY  Chief Complaint Seizures    HPI Martin Cooper is a 35 y.o. male with past medical history of asthma, seizure disorder, here with reported seizure.  The patient arrives via EMS.  They were called to the scene by patient's family.  Per report, the patient has had 2 seizures today.  He has had 2 episodes lasting up to 20 to 30 minutes per report, though he was only minimally confused afterwards.  Patient has a history of seizure disorder as well as nonadherence with medications.  He states he just moved back to the area around 2 months ago, and has not been on any of his medications.  He has been seen here for seizure-like activity and various complaints, with negative blood levels of antiepileptics.  Denies any recent head trauma or falls.  Denies any drug use.  No other complaints.    Past Medical History:  Diagnosis Date   Asthma    Seizures (HCC)     There are no problems to display for this patient.   History reviewed. No pertinent surgical history.  Prior to Admission medications   Medication Sig Start Date End Date Taking? Authorizing Provider  albuterol (PROVENTIL HFA;VENTOLIN HFA) 108 (90 Base) MCG/ACT inhaler Inhale 2 puffs into the lungs every 4 (four) hours as needed for wheezing or shortness of breath. 12/30/16   Cuthriell, Delorise Royals, PA-C  azithromycin (ZITHROMAX Z-PAK) 250 MG tablet Take 2 tablets (500 mg) on  Day 1,  followed by 1 tablet (250 mg) once daily on Days 2 through 5. 02/06/21 02/11/21  Minna Antis, MD  brompheniramine-pseudoephedrine-DM 30-2-10 MG/5ML syrup Take 10 mLs by mouth 4 (four) times daily as needed. Patient not taking: No sig reported 12/30/16   Cuthriell,  Delorise Royals, PA-C  fluticasone (FLONASE) 50 MCG/ACT nasal spray Place 1 spray into both nostrils 2 (two) times daily. 12/30/16   Cuthriell, Delorise Royals, PA-C  gabapentin (NEURONTIN) 300 MG capsule Take 1 capsule by mouth daily.    [provider]  guaiFENesin-codeine 100-10 MG/5ML syrup Take 5 mLs by mouth every 6 (six) hours as needed for cough. 02/06/21   Minna Antis, MD  levETIRAcetam (KEPPRA) 500 MG tablet Take 1 tablet (500 mg total) by mouth 2 (two) times daily. 02/10/21 03/12/21  Shaune Pollack, MD  ondansetron (ZOFRAN ODT) 4 MG disintegrating tablet Take 1 tablet (4 mg total) by mouth every 8 (eight) hours as needed for nausea or vomiting. 02/06/21   Minna Antis, MD  phenytoin (DILANTIN) 300 MG ER capsule Take 1 capsule (300 mg total) by mouth at bedtime. 02/10/21 03/12/21  Shaune Pollack, MD    Allergies Patient has no known allergies.  No family history on file.  Social History Social History   Tobacco Use   Smoking status: Every Day    Packs/day: 0.50    Types: Cigarettes   Smokeless tobacco: Never  Substance Use Topics   Alcohol use: No   Drug use: No    Review of Systems  Review of Systems  Constitutional:  Negative for chills, fatigue and fever.  HENT:  Negative for sore throat.   Respiratory:  Negative for shortness of breath.   Cardiovascular:  Negative for chest pain.  Gastrointestinal:  Negative for  abdominal pain.  Genitourinary:  Negative for flank pain.  Musculoskeletal:  Negative for neck pain.  Skin:  Negative for rash and wound.  Allergic/Immunologic: Negative for immunocompromised state.  Neurological:  Positive for seizures. Negative for weakness and numbness.  Hematological:  Does not bruise/bleed easily.    ____________________________________________  PHYSICAL EXAM:      VITAL SIGNS: ED Triage Vitals  Enc Vitals Group     BP 02/10/21 0959 136/82     Pulse Rate 02/10/21 0959 63     Resp 02/10/21 0959 16     Temp  02/10/21 1002 98.2 F (36.8 C)     Temp Source 02/10/21 1002 Oral     SpO2 02/10/21 0959 95 %     Weight 02/10/21 0959 183 lb 8 oz (83.2 kg)     Height 02/10/21 0959 5\' 5"  (1.651 m)     Head Circumference --      Peak Flow --      Pain Score 02/10/21 0959 0     Pain Loc --      Pain Edu? --      Excl. in GC? --      Physical Exam Vitals and nursing note reviewed.  Constitutional:      General: He is not in acute distress.    Appearance: He is well-developed.  HENT:     Head: Normocephalic and atraumatic.     Mouth/Throat:     Comments: No tongue lacerations or bleeding. Eyes:     Conjunctiva/sclera: Conjunctivae normal.  Cardiovascular:     Rate and Rhythm: Normal rate and regular rhythm.     Heart sounds: Normal heart sounds. No murmur heard.   No friction rub.  Pulmonary:     Effort: Pulmonary effort is normal. No respiratory distress.     Breath sounds: Normal breath sounds. No wheezing or rales.  Abdominal:     General: There is no distension.     Palpations: Abdomen is soft.     Tenderness: There is no abdominal tenderness.  Musculoskeletal:     Cervical back: Neck supple.  Skin:    General: Skin is warm.     Capillary Refill: Capillary refill takes less than 2 seconds.  Neurological:     Mental Status: He is alert and oriented to person, place, and time.     Cranial Nerves: Cranial nerves 2-12 are intact.     Sensory: Sensation is intact.     Motor: No abnormal muscle tone.      ____________________________________________   LABS (all labs ordered are listed, but only abnormal results are displayed)  Labs Reviewed  CBC WITH DIFFERENTIAL/PLATELET - Abnormal; Notable for the following components:      Result Value   RDW 11.3 (*)    All other components within normal limits  URINE DRUG SCREEN, QUALITATIVE ([facility] ONLY) - Abnormal; Notable for the following components:   Cocaine Metabolite,Ur [place] POSITIVE (*)    Cannabinoid 50 Ng, Ur [place] POSITIVE (*)     All other components within normal limits  COMPREHENSIVE METABOLIC PANEL  ETHANOL    ____________________________________________  EKG:  ________________________________________  RADIOLOGY All imaging, including plain films, CT scans, and ultrasounds, independently reviewed by me, and interpretations confirmed via formal radiology reads.  ED MD interpretation:     Official radiology report(s): No results found.  ____________________________________________  PROCEDURES   Procedure(s) performed (including Critical Care):  Procedures  ____________________________________________  INITIAL IMPRESSION / MDM / ASSESSMENT AND PLAN / ED  COURSE  As part of my medical decision making, I reviewed the following data within the electronic MEDICAL RECORD NUMBER Nursing notes reviewed and incorporated, Old chart reviewed, Notes from prior ED visits, and [place] Controlled Substance Database       *Martin Cooper was evaluated in Emergency Department on 02/10/2021 for the symptoms described in the history of present illness. He was evaluated in the context of the global COVID-19 pandemic, which necessitated consideration that the patient might be at risk for infection with the SARS-CoV-2 virus that causes COVID-19. Institutional protocols and algorithms that pertain to the evaluation of patients at risk for COVID-19 are in a state of rapid change based on information released by regulatory bodies including the CDC and federal and state organizations. These policies and algorithms were followed during the patient's care in the ED.  Some ED evaluations and interventions may be delayed as a result of limited staffing during the pandemic.*     Medical Decision Making: 35 yo M with PMHx as above here with seizure-like activity. Pt has a well documented h/o seizures and seizure-like activity, with nonadherence to med regimen. Suspect this is etiology of episodes today as pt has had no PO meds in weeks per  his report. Pt given load of IV phenytoin and PO keppra here, and his meds have been called in to med management. Otherwise, no apparent infectious trigger. He is back to his neurological baseline. CBC, BMP unremarkable. Pt also has +cocaine, THC which likely are contributing. Counseled him on obtaining meds, reducing drug use, and f/u with outpt Neurology.  ____________________________________________  FINAL CLINICAL IMPRESSION(S) / ED DIAGNOSES  Final diagnoses:  Seizure-like activity (HCC)     MEDICATIONS GIVEN DURING THIS VISIT:  Medications  levETIRAcetam (KEPPRA) tablet 500 mg (500 mg Oral Given 02/10/21 1031)  phenytoin (DILANTIN) 1,500 mg in sodium chloride 0.9 % 250 mL IVPB (0 mg Intravenous Stopped 02/10/21 1126)  sodium chloride 0.9 % bolus 1,000 mL (0 mLs Intravenous Stopped 02/10/21 1335)     ED Discharge Orders          Ordered    levETIRAcetam (KEPPRA) 500 MG tablet  2 times daily        02/10/21 1248    phenytoin (DILANTIN) 300 MG ER capsule  Daily at bedtime        02/10/21 1248             Note:  This document was prepared using Dragon voice recognition software and may include unintentional dictation errors.   Shaune Pollack, MD 02/10/21 Zachery Dauer, MD 02/10/21 Serena Croissant

## 2021-02-10 NOTE — Discharge Instructions (Signed)
Go to the medication management clinic across the street to pick up your medications.  These should be essentially free.  If you would like to use a different pharmacy, I have provided paper prescriptions for these as well.

## 2021-02-11 ENCOUNTER — Emergency Department
Admission: EM | Admit: 2021-02-11 | Discharge: 2021-02-11 | Payer: Medicaid Other | Attending: Emergency Medicine | Admitting: Emergency Medicine

## 2021-02-11 ENCOUNTER — Other Ambulatory Visit: Payer: Self-pay

## 2021-02-11 DIAGNOSIS — F17210 Nicotine dependence, cigarettes, uncomplicated: Secondary | ICD-10-CM | POA: Insufficient documentation

## 2021-02-11 DIAGNOSIS — R569 Unspecified convulsions: Secondary | ICD-10-CM

## 2021-02-11 DIAGNOSIS — J45909 Unspecified asthma, uncomplicated: Secondary | ICD-10-CM | POA: Insufficient documentation

## 2021-02-11 DIAGNOSIS — Z7951 Long term (current) use of inhaled steroids: Secondary | ICD-10-CM | POA: Diagnosis not present

## 2021-02-11 LAB — BASIC METABOLIC PANEL
Anion gap: 9 (ref 5–15)
BUN: 5 mg/dL — ABNORMAL LOW (ref 6–20)
CO2: 27 mmol/L (ref 22–32)
Calcium: 9.6 mg/dL (ref 8.9–10.3)
Chloride: 104 mmol/L (ref 98–111)
Creatinine, Ser: 1.19 mg/dL (ref 0.61–1.24)
GFR, Estimated: >60 mL/min (ref >60)
Glucose, Bld: 112 mg/dL — ABNORMAL HIGH (ref 70–99)
Potassium: 4 mmol/L (ref 3.5–5.1)
Sodium: 140 mmol/L (ref 135–145)

## 2021-02-11 LAB — CBC
HCT: 40.7 % (ref 39.0–52.0)
Hemoglobin: 13.5 g/dL (ref 13.0–17.0)
MCH: 30.6 pg (ref 26.0–34.0)
MCHC: 33.2 g/dL (ref 30.0–36.0)
MCV: 92.3 fL (ref 80.0–100.0)
Platelets: 310 10*3/uL (ref 150–400)
RBC: 4.41 MIL/uL (ref 4.22–5.81)
RDW: 11.3 % — ABNORMAL LOW (ref 11.5–15.5)
WBC: 6.5 10*3/uL (ref 4.0–10.5)
nRBC: 0 % (ref 0.0–0.2)

## 2021-02-11 MED ORDER — LEVETIRACETAM IN NACL 1500 MG/100ML IV SOLN
1500.0000 mg | Freq: Once | INTRAVENOUS | Status: AC
  Administered 2021-02-11: 1500 mg via INTRAVENOUS
  Filled 2021-02-11: qty 100

## 2021-02-11 MED ORDER — PHENYTOIN SODIUM EXTENDED 100 MG PO CAPS
300.0000 mg | ORAL_CAPSULE | Freq: Once | ORAL | Status: AC
  Administered 2021-02-11: 300 mg via ORAL
  Filled 2021-02-11: qty 3

## 2021-02-11 MED ORDER — SODIUM CHLORIDE 0.9 % IV SOLN
500.0000 mg | Freq: Once | INTRAVENOUS | Status: DC
  Filled 2021-02-11: qty 10

## 2021-02-11 NOTE — ED Provider Notes (Signed)
[facility] Emergency Department Provider Note ____________________________________________   Event Date/Time   First MD Initiated Contact with Patient 02/11/21 2114     (approximate)  I have reviewed the triage vital signs and the nursing notes.  HISTORY  Chief Complaint Seizures   HPI Martin Cooper is a 35 y.o. malewho presents to the ED for evaluation of seizure.   Chart review indicates seizure disorder on keppra and phenytoin. Frequent ED visits.  Nonadherence to medication regimen. Seen yesterday for the same.  Loaded with Keppra and Dilantin, discharged with neurology referral.  Patient presents to the ED today, in the custody of local law enforcement, for evaluation of a seizure.  Patient was arrested by local police for assaulting a woman, and reportedly had a 1 minute generalized tonic-clonic seizure on the way to jail, so he presents to the ED for evaluation.  Patient reports that he has not had a chance to pick up his prescription medications yet that are waiting for him at the pharmacy.  Denies recent falls, trauma or injuries.  Denies recent illnesses or changes since he was seen yesterday, beyond getting arrested.  He reports feeling better now and is asking about food.  Past Medical History:  Diagnosis Date   Asthma    Seizures (HCC)     There are no problems to display for this patient.   History reviewed. No pertinent surgical history.  Prior to Admission medications   Medication Sig Start Date End Date Taking? Authorizing Provider  albuterol (PROVENTIL HFA;VENTOLIN HFA) 108 (90 Base) MCG/ACT inhaler Inhale 2 puffs into the lungs every 4 (four) hours as needed for wheezing or shortness of breath. 12/30/16   Cuthriell, Delorise Royals, PA-C  azithromycin (ZITHROMAX Z-PAK) 250 MG tablet Take 2 tablets (500 mg) on  Day 1,  followed by 1 tablet (250 mg) once daily on Days 2 through 5. 02/06/21 02/11/21  Minna Antis, MD   brompheniramine-pseudoephedrine-DM 30-2-10 MG/5ML syrup Take 10 mLs by mouth 4 (four) times daily as needed. Patient not taking: No sig reported 12/30/16   Cuthriell, Delorise Royals, PA-C  fluticasone (FLONASE) 50 MCG/ACT nasal spray Place 1 spray into both nostrils 2 (two) times daily. 12/30/16   Cuthriell, Delorise Royals, PA-C  gabapentin (NEURONTIN) 300 MG capsule Take 1 capsule by mouth daily.    [provider]  guaiFENesin-codeine 100-10 MG/5ML syrup Take 5 mLs by mouth every 6 (six) hours as needed for cough. 02/06/21   Minna Antis, MD  levETIRAcetam (KEPPRA) 500 MG tablet Take 1 tablet (500 mg total) by mouth 2 (two) times daily. 02/10/21 03/12/21  Shaune Pollack, MD  ondansetron (ZOFRAN ODT) 4 MG disintegrating tablet Take 1 tablet (4 mg total) by mouth every 8 (eight) hours as needed for nausea or vomiting. 02/06/21   Minna Antis, MD  phenytoin (DILANTIN) 300 MG ER capsule Take 1 capsule (300 mg total) by mouth at bedtime. 02/10/21 03/12/21  Shaune Pollack, MD    Allergies Patient has no known allergies.  History reviewed. No pertinent family history.  Social History Social History   Tobacco Use   Smoking status: Every Day    Packs/day: 0.50    Types: Cigarettes   Smokeless tobacco: Never  Substance Use Topics   Alcohol use: No   Drug use: No    Review of Systems  Constitutional: No fever/chills Eyes: No visual changes. ENT: No sore throat. Cardiovascular: Denies chest pain. Respiratory: Denies shortness of breath. Gastrointestinal: No abdominal pain.  No  nausea, no vomiting.  No diarrhea.  No constipation. Genitourinary: Negative for dysuria. Musculoskeletal: Negative for back pain. Skin: Negative for rash. Neurological: Negative for headaches, focal weakness or numbness.  Seizure activity  ____________________________________________   PHYSICAL EXAM:  VITAL SIGNS: Vitals:   02/11/21 2108 02/11/21 2150  BP: (!) 140/94 125/78  Pulse: 73 74   Resp: 18 14  Temp: 98.6 F (37 C)   SpO2: 95% 99%     Constitutional: Alert and oriented. Well appearing and in no acute distress. Eyes: Conjunctivae are normal. PERRL. EOMI. Head: Atraumatic. Nose: No congestion/rhinnorhea. Mouth/Throat: Mucous membranes are moist.  Oropharynx non-erythematous. Neck: No stridor. No cervical spine tenderness to palpation. Cardiovascular: Normal rate, regular rhythm. Grossly normal heart sounds.  Good peripheral circulation. Respiratory: Normal respiratory effort.  No retractions. Lungs CTAB. Gastrointestinal: Soft , nondistended, nontender to palpation. No CVA tenderness. Musculoskeletal: No lower extremity tenderness nor edema.  No joint effusions. No signs of acute trauma. Neurologic:  Normal speech and language. No gross focal neurologic deficits are appreciated. No gait instability noted. Cranial nerves II through XII intact 5/5 strength and sensation in all 4 extremities Skin:  Skin is warm, dry and intact. No rash noted. Psychiatric: Mood and affect are normal. Speech and behavior are normal.  ____________________________________________   LABS (all labs ordered are listed, but only abnormal results are displayed)  Labs Reviewed  CBC - Abnormal; Notable for the following components:      Result Value   RDW 11.3 (*)    All other components within normal limits  BASIC METABOLIC PANEL - Abnormal; Notable for the following components:   Glucose, Bld 112 (*)    BUN 5 (*)    All other components within normal limits  URINE DRUG SCREEN, QUALITATIVE ([facility] ONLY)  ETHANOL  PHENYTOIN LEVEL, TOTAL   ____________________________________________  12 Lead EKG   ____________________________________________  RADIOLOGY  ED MD interpretation:    Official radiology report(s): No results found.  ____________________________________________   PROCEDURES and INTERVENTIONS  Procedure(s) performed (including Critical  Care):  Procedures  Medications  phenytoin (DILANTIN) ER capsule 300 mg (has no administration in time range)  levETIRAcetam (KEPPRA) IVPB 1500 mg/ 100 mL premix (1,500 mg Intravenous New Bag/Given 02/11/21 2149)    ____________________________________________   MDM / ED COURSE   35 year old male presents to the ED after an episode of seizure-like activity in the custody of law enforcement.  Likely due to medication noncompliance versus secondary gain.  We will send some screening labs, sent a phenytoin level.  We will load both his phenytoin and Keppra.  Anticipate discharge to the custody of law enforcement.  No evidence of status epilepticus or underlying medical pathology.     ____________________________________________   FINAL CLINICAL IMPRESSION(S) / ED DIAGNOSES  Final diagnoses:  Seizure-like activity [facility])     ED Discharge Orders     None        Shanyla Marconi   Note:  This document was prepared using Dragon voice recognition software and may include unintentional dictation errors.    Delton [place], MD 02/11/21 2206

## 2021-02-11 NOTE — ED Triage Notes (Signed)
Pt presents to ER with c/o seizure.  Pt has hx of epilepsy and takes keppra TID but ran out of his meds last night and has not got a new rx yet.  When pt arrived to ER, he was stumbling in, and appeared to be foaming at the mouth. Pt now A&O x4.

## 2021-02-12 ENCOUNTER — Emergency Department
Admission: EM | Admit: 2021-02-12 | Discharge: 2021-02-12 | Disposition: A | Discharge status: Home / Self Care | Payer: Medicaid Other | Attending: Emergency Medicine | Admitting: Emergency Medicine

## 2021-02-12 ENCOUNTER — Other Ambulatory Visit: Payer: Self-pay

## 2021-02-12 ENCOUNTER — Encounter: Payer: Self-pay | Admitting: Emergency Medicine

## 2021-02-12 DIAGNOSIS — F17210 Nicotine dependence, cigarettes, uncomplicated: Secondary | ICD-10-CM | POA: Insufficient documentation

## 2021-02-12 DIAGNOSIS — J45909 Unspecified asthma, uncomplicated: Secondary | ICD-10-CM | POA: Insufficient documentation

## 2021-02-12 DIAGNOSIS — Z7951 Long term (current) use of inhaled steroids: Secondary | ICD-10-CM | POA: Insufficient documentation

## 2021-02-12 DIAGNOSIS — G40909 Epilepsy, unspecified, not intractable, without status epilepticus: Secondary | ICD-10-CM

## 2021-02-12 DIAGNOSIS — G40911 Epilepsy, unspecified, intractable, with status epilepticus: Secondary | ICD-10-CM | POA: Diagnosis not present

## 2021-02-12 DIAGNOSIS — R519 Headache, unspecified: Secondary | ICD-10-CM | POA: Diagnosis present

## 2021-02-12 LAB — BASIC METABOLIC PANEL
Anion gap: 11 (ref 5–15)
BUN: 5 mg/dL — ABNORMAL LOW (ref 6–20)
CO2: 27 mmol/L (ref 22–32)
Calcium: 9.8 mg/dL (ref 8.9–10.3)
Chloride: 104 mmol/L (ref 98–111)
Creatinine, Ser: 1.16 mg/dL (ref 0.61–1.24)
GFR, Estimated: >60 mL/min (ref >60)
Glucose, Bld: 88 mg/dL (ref 70–99)
Potassium: 3.8 mmol/L (ref 3.5–5.1)
Sodium: 142 mmol/L (ref 135–145)

## 2021-02-12 LAB — PHENYTOIN LEVEL, TOTAL: Phenytoin Lvl: 19.9 ug/mL (ref 10.0–20.0)

## 2021-02-12 MED ORDER — PHENYTOIN SODIUM EXTENDED 300 MG PO CAPS: 300.0000 mg | ORAL_CAPSULE | Freq: Every day | ORAL | 1 refills | Status: DC

## 2021-02-12 MED ORDER — LEVETIRACETAM 500 MG PO TABS
500.0000 mg | ORAL_TABLET | Freq: Once | ORAL | Status: AC
  Administered 2021-02-12: 500 mg via ORAL
  Filled 2021-02-12: qty 1

## 2021-02-12 MED ORDER — LEVETIRACETAM 500 MG PO TABS: 500.0000 mg | ORAL_TABLET | Freq: Two times a day (BID) | ORAL | 1 refills | Status: DC

## 2021-02-12 NOTE — ED Provider Notes (Signed)
[facility] Emergency Department Provider Note   ____________________________________________   Event Date/Time   First MD Initiated Contact with Patient 02/12/21 1304     (approximate)  I have reviewed the triage vital signs and the nursing notes.   HISTORY  Chief Complaint Seizures    HPI Martin Cooper is a 35 y.o. male with possible history of asthma and seizures who presents to the ED following seizure.  Patient is currently an inmate and arrives in custody of local sheriff, states he believes he had a seizure around 10:00 this morning.  Patient is unsure how long it lasted, states he has not had his seizure medications since being in jail.  He currently complains of a headache, but denies hitting his head and states he always has a headache after seizures.  He denies any vision changes, speech changes, numbness, weakness.  Officer at bedside was not present for reported seizure episode, but has paperwork stating patient had witnessed seizure activity.  Paperwork also notes that jail has been unable confirm his seizure medications via the pharmacy yet.  Patient was seen in the ED yesterday for seizure like activity, was given loading doses of Keppra and Dilantin at that time.        Past Medical History:  Diagnosis Date   Asthma    Seizures (HCC)     There are no problems to display for this patient.   History reviewed. No pertinent surgical history.  Prior to Admission medications   Medication Sig Start Date End Date Taking? Authorizing Provider  albuterol (PROVENTIL HFA;VENTOLIN HFA) 108 (90 Base) MCG/ACT inhaler Inhale 2 puffs into the lungs every 4 (four) hours as needed for wheezing or shortness of breath. 12/30/16   Cuthriell, Delorise Royals, PA-C  brompheniramine-pseudoephedrine-DM 30-2-10 MG/5ML syrup Take 10 mLs by mouth 4 (four) times daily as needed. Patient not taking: No sig reported 12/30/16   Cuthriell, Delorise Royals, PA-C  fluticasone  (FLONASE) 50 MCG/ACT nasal spray Place 1 spray into both nostrils 2 (two) times daily. 12/30/16   Cuthriell, Delorise Royals, PA-C  gabapentin (NEURONTIN) 300 MG capsule Take 1 capsule by mouth daily.    [provider]  guaiFENesin-codeine 100-10 MG/5ML syrup Take 5 mLs by mouth every 6 (six) hours as needed for cough. 02/06/21   Minna Antis, MD  levETIRAcetam (KEPPRA) 500 MG tablet Take 1 tablet (500 mg total) by mouth 2 (two) times daily. 02/12/21 04/13/21  Chesley Noon, MD  ondansetron (ZOFRAN ODT) 4 MG disintegrating tablet Take 1 tablet (4 mg total) by mouth every 8 (eight) hours as needed for nausea or vomiting. 02/06/21   Minna Antis, MD  phenytoin (DILANTIN) 300 MG ER capsule Take 1 capsule (300 mg total) by mouth at bedtime. 02/12/21 04/13/21  Chesley Noon, MD    Allergies Patient has no known allergies.  No family history on file.  Social History Social History   Tobacco Use   Smoking status: Every Day    Packs/day: 0.50    Types: Cigarettes   Smokeless tobacco: Never  Substance Use Topics   Alcohol use: No   Drug use: No    Review of Systems  Constitutional: No fever/chills Eyes: No visual changes. ENT: No sore throat. Cardiovascular: Denies chest pain. Respiratory: Denies shortness of breath. Gastrointestinal: No abdominal pain.  No nausea, no vomiting.  No diarrhea.  No constipation. Genitourinary: Negative for dysuria. Musculoskeletal: Negative for back pain. Skin: Negative for rash. Neurological: Positive for seizure and headache, negative  for focal weakness or numbness.  ____________________________________________   PHYSICAL EXAM:  VITAL SIGNS: ED Triage Vitals  Enc Vitals Group     BP 02/12/21 1112 136/77     Pulse Rate 02/12/21 1112 71     Resp 02/12/21 1112 20     Temp 02/12/21 1112 97.9 F (36.6 C)     Temp Source 02/12/21 1112 Oral     SpO2 02/12/21 1112 100 %     Weight 02/12/21 1109 180 lb (81.6 kg)     Height  02/12/21 1109 5\' 5"  (1.651 m)     Head Circumference --      Peak Flow --      Pain Score 02/12/21 1109 0     Pain Loc --      Pain Edu? --      Excl. in GC? --     Constitutional: Alert and oriented. Eyes: Conjunctivae are normal. Head: Atraumatic. Nose: No congestion/rhinnorhea. Mouth/Throat: Mucous membranes are moist. Neck: Normal ROM, no midline cervical spine tenderness to palpation. Cardiovascular: Normal rate, regular rhythm. Grossly normal heart sounds.  2+ radial pulses bilaterally. Respiratory: Normal respiratory effort.  No retractions. Lungs CTAB. Gastrointestinal: Soft and nontender. No distention. Genitourinary: deferred Musculoskeletal: No lower extremity tenderness nor edema. Neurologic:  Normal speech and language. No gross focal neurologic deficits are appreciated. Skin:  Skin is warm, dry and intact. No rash noted. Psychiatric: Mood and affect are normal. Speech and behavior are normal.  ____________________________________________   LABS (all labs ordered are listed, but only abnormal results are displayed)  Labs Reviewed  BASIC METABOLIC PANEL - Abnormal; Notable for the following components:      Result Value   BUN 5 (*)    All other components within normal limits  PHENYTOIN LEVEL, TOTAL    PROCEDURES  Procedure(s) performed (including Critical Care):  Procedures   ____________________________________________   INITIAL IMPRESSION / ASSESSMENT AND PLAN / ED COURSE      35 year old male with past medical history of asthma and seizures who presents to the ED for seizure-like episode witnessed at local jail.  Patient is now awake and alert, complains only of headache but denies hitting his head and has no focal neurologic deficits on exam.  He states headaches following seizures is normal for him and symptoms today are no different.  I do not feel CT head is warranted at this time and labs are unremarkable, phenytoin level is therapeutic.  We  will give his usual dose of Keppra and plan for discharge back to jail.  He will be provided paper prescriptions for his seizure medication and officer at bedside assures me they will be able to fill and administer these.  Patient counseled to return to the ED for new worsening symptoms, patient agrees with plan.      ____________________________________________   FINAL CLINICAL IMPRESSION(S) / ED DIAGNOSES  Final diagnoses:  Seizure disorder [facility])     ED Discharge Orders          Ordered    levETIRAcetam (KEPPRA) 500 MG tablet  2 times daily        02/12/21 1335    phenytoin (DILANTIN) 300 MG ER capsule  Daily at bedtime        02/12/21 1335             Note:  This document was prepared using Dragon voice recognition software and may include unintentional dictation errors.    02/14/21, MD 02/12/21 [phone]

## 2021-02-12 NOTE — ED Triage Notes (Signed)
Pt is an inmate in custody. Pt reports had a seizure. Pt states hx of there same but is not sure what medication he was taking.

## 2021-02-18 ENCOUNTER — Encounter: Payer: Self-pay | Admitting: Emergency Medicine

## 2021-02-18 ENCOUNTER — Emergency Department
Admission: EM | Admit: 2021-02-18 | Discharge: 2021-02-19 | Disposition: A | Discharge status: Home / Self Care | Payer: Medicaid Other | Attending: Emergency Medicine | Admitting: Emergency Medicine

## 2021-02-18 ENCOUNTER — Emergency Department: Payer: Medicaid Other

## 2021-02-18 ENCOUNTER — Other Ambulatory Visit: Payer: Self-pay

## 2021-02-18 DIAGNOSIS — Z7951 Long term (current) use of inhaled steroids: Secondary | ICD-10-CM | POA: Diagnosis not present

## 2021-02-18 DIAGNOSIS — R569 Unspecified convulsions: Secondary | ICD-10-CM | POA: Diagnosis present

## 2021-02-18 DIAGNOSIS — J45909 Unspecified asthma, uncomplicated: Secondary | ICD-10-CM | POA: Diagnosis not present

## 2021-02-18 DIAGNOSIS — F17210 Nicotine dependence, cigarettes, uncomplicated: Secondary | ICD-10-CM | POA: Insufficient documentation

## 2021-02-18 LAB — BASIC METABOLIC PANEL
Anion gap: 8 (ref 5–15)
BUN: 10 mg/dL (ref 6–20)
CO2: 25 mmol/L (ref 22–32)
Calcium: 8.7 mg/dL — ABNORMAL LOW (ref 8.9–10.3)
Chloride: 104 mmol/L (ref 98–111)
Creatinine, Ser: 1.18 mg/dL (ref 0.61–1.24)
GFR, Estimated: >60 mL/min (ref >60)
Glucose, Bld: 88 mg/dL (ref 70–99)
Potassium: 4.1 mmol/L (ref 3.5–5.1)
Sodium: 137 mmol/L (ref 135–145)

## 2021-02-18 LAB — CBC
HCT: 35.5 % — ABNORMAL LOW (ref 39.0–52.0)
Hemoglobin: 12 g/dL — ABNORMAL LOW (ref 13.0–17.0)
MCH: 31.7 pg (ref 26.0–34.0)
MCHC: 33.8 g/dL (ref 30.0–36.0)
MCV: 93.9 fL (ref 80.0–100.0)
Platelets: 277 10*3/uL (ref 150–400)
RBC: 3.78 MIL/uL — ABNORMAL LOW (ref 4.22–5.81)
RDW: 11.6 % (ref 11.5–15.5)
WBC: 7.1 10*3/uL (ref 4.0–10.5)
nRBC: 0 % (ref 0.0–0.2)

## 2021-02-18 LAB — CBG MONITORING, ED: Glucose-Capillary: 88 mg/dL (ref 70–99)

## 2021-02-18 LAB — PHENYTOIN LEVEL, TOTAL: Phenytoin Lvl: <2.5 ug/mL — ABNORMAL LOW (ref 10.0–20.0)

## 2021-02-18 MED ORDER — LEVETIRACETAM 500 MG PO TABS: 500.0000 mg | ORAL_TABLET | Freq: Two times a day (BID) | ORAL | 1 refills | Status: DC

## 2021-02-18 MED ORDER — SODIUM CHLORIDE 0.9 % IV SOLN
1500.0000 mg | Freq: Once | INTRAVENOUS | Status: AC
  Administered 2021-02-18: 1500 mg via INTRAVENOUS
  Filled 2021-02-18: qty 20

## 2021-02-18 MED ORDER — LEVETIRACETAM IN NACL 1500 MG/100ML IV SOLN
1500.0000 mg | Freq: Once | INTRAVENOUS | Status: AC
  Administered 2021-02-18: 1500 mg via INTRAVENOUS
  Filled 2021-02-18: qty 100

## 2021-02-18 MED ORDER — MIDAZOLAM HCL 2 MG/2ML IJ SOLN
4.0000 mg | Freq: Once | INTRAMUSCULAR | Status: AC
  Administered 2021-02-18: 4 mg via INTRAVENOUS
  Filled 2021-02-18: qty 4

## 2021-02-18 MED ORDER — PHENYTOIN SODIUM EXTENDED 300 MG PO CAPS: 300.0000 mg | ORAL_CAPSULE | Freq: Every day | ORAL | 1 refills | Status: DC

## 2021-02-18 NOTE — ED Notes (Signed)
Tremors start up again, noted to bilateral upper and lower extremities.  Patient states, "I'm having a really bad one."  Patient able to communicate with this RN throughout episode.  EDP, Quale aware.

## 2021-02-18 NOTE — ED Notes (Signed)
EDP, Ward at bedside to reassess patient.

## 2021-02-18 NOTE — ED Provider Notes (Signed)
[facility] Emergency Department Provider Note   ____________________________________________   Event Date/Time   First MD Initiated Contact with Patient 02/18/21 2211     (approximate)  I have reviewed the triage vital signs and the nursing notes.   HISTORY  Chief Complaint Altered Mental Status   EM caveat: History provided by friend Martin Cooper listed as his primary contact.  Patient with active shaking and seizure-like activity HPI Martin Cooper is a 35 y.o. male history of asthma and seizure disorder  Patient's friend Martin Cooper reports that today he had told her he was very stressed out about some issues with the family.  He then reported that he felt as though he might have seizures, and evidently stresses triggered these in the past  Unclear if he has been compliant with his medication though he does have a history of medication noncompliance previous  Martin Cooper notes that he has had several episodes of shaking and seizure-like activity today  He is otherwise been well and in his normal state of health until he started feeling very stressed out  Past Medical History:  Diagnosis Date   Asthma    Seizures (HCC)     There are no problems to display for this patient.   History reviewed. No pertinent surgical history.  Prior to Admission medications   Medication Sig Start Date End Date Taking? Authorizing Provider  albuterol (PROVENTIL HFA;VENTOLIN HFA) 108 (90 Base) MCG/ACT inhaler Inhale 2 puffs into the lungs every 4 (four) hours as needed for wheezing or shortness of breath. 12/30/16  Yes Cuthriell, Delorise Royals, PA-C  fluticasone (FLONASE) 50 MCG/ACT nasal spray Place 1 spray into both nostrils 2 (two) times daily. 12/30/16  Yes Cuthriell, Delorise Royals, PA-C  gabapentin (NEURONTIN) 300 MG capsule Take 1 capsule by mouth daily.   Yes [provider]  guaiFENesin-codeine 100-10 MG/5ML syrup Take 5 mLs by mouth every 6 (six) hours as needed  for cough. 02/06/21  Yes Minna Antis, MD  levETIRAcetam (KEPPRA) 500 MG tablet Take 1 tablet (500 mg total) by mouth 2 (two) times daily. 02/12/21 04/13/21 Yes Chesley Noon, MD  ondansetron (ZOFRAN ODT) 4 MG disintegrating tablet Take 1 tablet (4 mg total) by mouth every 8 (eight) hours as needed for nausea or vomiting. 02/06/21  Yes Minna Antis, MD  phenytoin (DILANTIN) 300 MG ER capsule Take 1 capsule (300 mg total) by mouth at bedtime. 02/12/21 04/13/21 Yes Chesley Noon, MD  brompheniramine-pseudoephedrine-DM 30-2-10 MG/5ML syrup Take 10 mLs by mouth 4 (four) times daily as needed. Patient not taking: No sig reported 12/30/16   Cuthriell, Delorise Royals, PA-C    Allergies Patient has no known allergies.  No family history on file.  Social History Social History   Tobacco Use   Smoking status: Every Day    Packs/day: 0.50    Types: Cigarettes   Smokeless tobacco: Never  Vaping Use   Vaping Use: Never used  Substance Use Topics   Alcohol use: No   Drug use: No    Review of Systems  EM caveat   ____________________________________________   PHYSICAL EXAM:  VITAL SIGNS: ED Triage Vitals [02/18/21 2214]  Enc Vitals Group     BP      Pulse      Resp      Temp 98.4 F (36.9 C)     Temp Source Oral     SpO2      Weight      Height  Head Circumference      Peak Flow      Pain Score      Pain Loc      Pain Edu?      Excl. in GC?     Constitutional: Alert and oriented when asked he is able to tell me his first name.  Turns his head side to side, he does watch as I examine him, and he watches and turned his head looking at the monitor and staff. Eyes: Conjunctivae are normal. Head: Atraumatic. Nose: No congestion/rhinnorhea. Mouth/Throat: Mucous membranes are moist. Neck: No stridor.  Cardiovascular: Normal rate, regular rhythm. Grossly normal heart sounds.  Good peripheral circulation. -Notably the patient is not tachycardic Respiratory:  Normal respiratory effort.  No retractions. Lungs CTAB. Gastrointestinal: Soft and nontender. No distention. Musculoskeletal: No lower extremity tenderness nor edema.  He moves all of his extremities to touch. Neurologic:  Normal speech and language. No gross focal neurologic deficits are appreciated.  Skin:  Skin is warm, dry and intact. No rash noted. Psychiatric: He seems anxious.  He exhibits tremulousness in his right hand and right arm.  Is unclear to me if the patient is having active focal seizures involving the right side, or if this may represent not epileptiform activity.  He is alert, speaks his name, and follows my exam but continues to tremble in his right arm and right leg somewhat intermittently.  He does appear anxious however.  He does not exhibit obvious generalized shaking.  He is not urinated on himself and he has not bit his tongue.  He is respirating without difficulty and does not have clenched jaw.   ____________________________________________   LABS (all labs ordered are listed, but only abnormal results are displayed)  Labs Reviewed  PHENYTOIN LEVEL, TOTAL - Abnormal; Notable for the following components:      Result Value   Phenytoin Lvl <2.5 (*)    All other components within normal limits  CBC - Abnormal; Notable for the following components:   RBC 3.78 (*)    Hemoglobin 12.0 (*)    HCT 35.5 (*)    All other components within normal limits  BASIC METABOLIC PANEL - Abnormal; Notable for the following components:   Calcium 8.7 (*)    All other components within normal limits  CBG MONITORING, ED   ____________________________________________  EKG  Reviewed inter by me at 2230 Heart rate 75 QRS 99 QTc 4 the Normal sinus rhythm, early repolarization. -Appears similar to previous EKGs ____________________________________________  RADIOLOGY  CT HEAD WO CONTRAST ( )  Result Date: 02/18/2021 CLINICAL DATA:  Seizure EXAM: CT HEAD WITHOUT CONTRAST  TECHNIQUE: Contiguous axial images were obtained from the base of the skull through the vertex without intravenous contrast. COMPARISON:  Multiple priors including most recent CT January 28, 2021 FINDINGS: Brain: No evidence of acute infarction, hemorrhage, hydrocephalus, extra-axial collection or mass lesion/mass effect. Vascular: No hyperdense vessel or unexpected calcification. Skull: Normal. Negative for fracture or focal lesion. Sinuses/Orbits: The visualized portions of the paranasal sinuses and mastoid air cells are predominantly clear. Orbits are unremarkable. Other: Similar appearance of the remote fracture of the right zygomatic arch. IMPRESSION: No acute intracranial findings. Electronically Signed   By: Maudry Mayhew M.D.   On: 02/18/2021 23:04     CT head reviewed, no acute findings ____________________________________________   PROCEDURES  Procedure(s) performed: None  Procedures  Critical Care performed: No  ____________________________________________   INITIAL IMPRESSION / ASSESSMENT AND PLAN / ED COURSE  Pertinent  labs & imaging results that were available during my care of the patient were reviewed by me and considered in my medical decision making (see chart for details).   Patient presents for shaking and tremulousness notable primarily over the right arm right leg.  He does have a reported history of seizure disorder and prescribed Keppra and Dilantin  Normal reassuring vital signs no evidence of acute hemodynamic instability.  He is able to converse and hold conversation with tremulousness, and given this finding as well as apparently that his seizures are brought about oftentimes during times of stress I suspect this may actually be nonepileptic form, though I cannot particularly prove that at this point.  Nonetheless we will treat with reinstitution of his seizure regimen.  Midazolam at this time  Clinical Course as of 02/18/21 2322  Sat Feb 18, 2021  2224  Nursing reports patient is able to answer their questions. [MQ]  2255 Reevaluated the patient, calm and resting.  No evidence of seizure activity.  He is able to state his name and converse. [MQ]  2255 Spoke with the patient's friend who is his listed emergency contact.  She advises that he recently had financial issues unable to get his medication, but has now been able to pick up his seizure medication prescription in the last week.  Unclear however if he is actually taking [MQ]  2301 Patient reports has been compliant with his seizure medication, however his girlfriend has bottles of both medications with her and they both appear to be essentially full.  His phenytoin level is undetectable and I've confirmed his Rx is for phenytoin 900mg  QHS and Keppra 500 mg BID.  [MQ]  2302 Patient is alert, well oriented.  [MQ]    Clinical Course User Index [MQ] 2303, MD   ----------------------------------------- 11:23 PM on 02/18/2021 ----------------------------------------- Patient's work-up to this point very reassuring.  He is now alert, restful.  Continues to feel tremulous in both of his hands but is able to converse and speak with normal orientation.  Waiting reinitiation with fosphenytoin.  He does have his prescriptions with him, discussed with both him and his girlfriend recommendation for medication compliance and seizure precautions.  Ongoing care assigned Dr. 13/08/2020, anticipate discharge to home after completion of fosphenytoin load    ____________________________________________   FINAL CLINICAL IMPRESSION(S) / ED DIAGNOSES  Final diagnoses:  Convulsions, unspecified convulsion type [facility])        Note:  This document was prepared using Dragon voice recognition software and may include unintentional dictation errors       [facility], MD 02/18/21 2324

## 2021-02-18 NOTE — ED Notes (Signed)
Patient transported to CT 

## 2021-02-18 NOTE — Discharge Instructions (Signed)

## 2021-02-18 NOTE — ED Triage Notes (Signed)
Pt in via [facility] from home; per EMS, patient with seizure like activity.  Patient drooling at mouth upon arrival, tremors bilaterally noted.  Pt responsive to sternal rub, states, "I have been having seizures."  A/Ox3, vitals WDL, NAD noted at this time.

## 2021-02-18 NOTE — ED Provider Notes (Addendum)
11:55 PM  Assumed care in signout.  Patient with history of seizure-like activity who presents to the emergency department with seizure-like activity today.  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  Family reports that he does not have a local neurologist and gets his medications refilled in the emergency department.  He is on Keppra 500 mg twice daily and phenytoin extended release 300 mg at bedtime.  He reports compliance with these medications however his Dilantin level is undetectable.  He has been loaded with medications here in the ED and on my evaluation has no further seizure-like activity.  Moving all extremities equally.  No facial asymmetry.  Normal speech.  Is slightly drowsy after receiving Versed here but arousable, protecting airway.  I feel he is safe to be discharged home with family.  Will give refills of his medications and outpatient neurology follow-up.  Labs, head CT here are unremarkable.  At this time, I do not feel there is any life-threatening condition present. I have reviewed, interpreted and discussed all results (EKG, imaging, lab, urine as appropriate) and exam findings with patient/family. I have reviewed nursing notes and appropriate previous records.  I feel the patient is safe to be discharged home without further emergent workup and can continue workup as an outpatient as needed. Discussed usual and customary return precautions. Patient/family verbalize understanding and are comfortable with this plan.  Outpatient follow-up has been provided as needed. All questions have been answered.    Virgle Arth, Layla Maw, DO 02/18/21 2358    Kenyan Karnes, Layla Maw, DO 02/18/21 2359

## 2021-02-20 ENCOUNTER — Encounter: Payer: Self-pay | Admitting: Emergency Medicine

## 2021-02-20 ENCOUNTER — Other Ambulatory Visit: Payer: Self-pay

## 2021-02-20 ENCOUNTER — Emergency Department
Admission: EM | Admit: 2021-02-20 | Discharge: 2021-02-20 | Disposition: A | Discharge status: Home / Self Care | Payer: Medicaid Other | Attending: Emergency Medicine | Admitting: Emergency Medicine

## 2021-02-20 ENCOUNTER — Emergency Department: Payer: Medicaid Other

## 2021-02-20 DIAGNOSIS — R569 Unspecified convulsions: Secondary | ICD-10-CM

## 2021-02-20 DIAGNOSIS — J45909 Unspecified asthma, uncomplicated: Secondary | ICD-10-CM | POA: Insufficient documentation

## 2021-02-20 DIAGNOSIS — Z7952 Long term (current) use of systemic steroids: Secondary | ICD-10-CM | POA: Insufficient documentation

## 2021-02-20 DIAGNOSIS — F17210 Nicotine dependence, cigarettes, uncomplicated: Secondary | ICD-10-CM | POA: Diagnosis not present

## 2021-02-20 LAB — CBC
HCT: 34.6 % — ABNORMAL LOW (ref 39.0–52.0)
Hemoglobin: 11.6 g/dL — ABNORMAL LOW (ref 13.0–17.0)
MCH: 31.8 pg (ref 26.0–34.0)
MCHC: 33.5 g/dL (ref 30.0–36.0)
MCV: 94.8 fL (ref 80.0–100.0)
Platelets: 297 10*3/uL (ref 150–400)
RBC: 3.65 MIL/uL — ABNORMAL LOW (ref 4.22–5.81)
RDW: 11.5 % (ref 11.5–15.5)
WBC: 5.3 10*3/uL (ref 4.0–10.5)
nRBC: 0 % (ref 0.0–0.2)

## 2021-02-20 LAB — BASIC METABOLIC PANEL
Anion gap: 8 (ref 5–15)
BUN: 9 mg/dL (ref 6–20)
CO2: 26 mmol/L (ref 22–32)
Calcium: 8.6 mg/dL — ABNORMAL LOW (ref 8.9–10.3)
Chloride: 104 mmol/L (ref 98–111)
Creatinine, Ser: 1.2 mg/dL (ref 0.61–1.24)
GFR, Estimated: >60 mL/min (ref >60)
Glucose, Bld: 118 mg/dL — ABNORMAL HIGH (ref 70–99)
Potassium: 3.8 mmol/L (ref 3.5–5.1)
Sodium: 138 mmol/L (ref 135–145)

## 2021-02-20 LAB — PHENYTOIN LEVEL, TOTAL: Phenytoin Lvl: 13 ug/mL (ref 10.0–20.0)

## 2021-02-20 MED ORDER — PHENYTOIN SODIUM EXTENDED 100 MG PO CAPS
300.0000 mg | ORAL_CAPSULE | Freq: Once | ORAL | Status: AC
  Administered 2021-02-20: 300 mg via ORAL
  Filled 2021-02-20: qty 3

## 2021-02-20 MED ORDER — LEVETIRACETAM 500 MG PO TABS
500.0000 mg | ORAL_TABLET | Freq: Once | ORAL | Status: AC
  Administered 2021-02-20: 500 mg via ORAL
  Filled 2021-02-20: qty 1

## 2021-02-20 NOTE — Discharge Instructions (Signed)
Be sure to take your medication every day as prescribed to help control your seizure episodes.  Please follow up with neurology, who can adjust your medicine if needed.

## 2021-02-20 NOTE — ED Notes (Signed)
Pt able to tolerate PO challenge

## 2021-02-20 NOTE — ED Triage Notes (Signed)
Pt via EMS from home. Pt had a witnessed seizure. Family states it was approx 2 mins and he was post ictal about 2 mins after. Pt is on Dilantin and Keppra but was seen here 2 days ago and he states there was some medication changes. He thinks he may have hit his head. Pt is now A&OX4 and NAD.

## 2021-02-20 NOTE — ED Notes (Signed)
EDP at bedside  

## 2021-02-20 NOTE — ED Provider Notes (Signed)
[facility] Emergency Department Provider Note  ____________________________________________  Time seen: Approximately 12:16 PM  I have reviewed the triage vital signs and the nursing notes.   HISTORY  Chief Complaint Seizures    HPI Martin Cooper is a 35 y.o. male with a history of asthma and seizures who comes ED complaining of having a seizure this morning.  Family was there he says it lasted about 2 minutes and he was postictal for about 2 minutes.  Patient reports that he takes Keppra 500 twice daily and Dilantin 900 at night.  However, he reports he has not had any medicine yesterday or today but otherwise believes he has been compliant with his medications.  He was seen in the ED 2 days ago for seizure with a negative phenytoin level, given medication load and discharged.  Today patient also reports that when he had a seizure he fell and believes he hit the left side of his head on the floor.  He does complain of headache on the left side.  No vision or hearing change.  No weakness or paresthesia, no neck pain.    Past Medical History:  Diagnosis Date   Asthma    Seizures (HCC)      There are no problems to display for this patient.    History reviewed. No pertinent surgical history.   Prior to Admission medications   Medication Sig Start Date End Date Taking? Authorizing Provider  albuterol (PROVENTIL HFA;VENTOLIN HFA) 108 (90 Base) MCG/ACT inhaler Inhale 2 puffs into the lungs every 4 (four) hours as needed for wheezing or shortness of breath. 12/30/16   Cuthriell, Delorise Royals, PA-C  brompheniramine-pseudoephedrine-DM 30-2-10 MG/5ML syrup Take 10 mLs by mouth 4 (four) times daily as needed. Patient not taking: No sig reported 12/30/16   Cuthriell, Delorise Royals, PA-C  fluticasone (FLONASE) 50 MCG/ACT nasal spray Place 1 spray into both nostrils 2 (two) times daily. 12/30/16   Cuthriell, Delorise Royals, PA-C  gabapentin (NEURONTIN) 300 MG capsule  Take 1 capsule by mouth daily.    [provider]  guaiFENesin-codeine 100-10 MG/5ML syrup Take 5 mLs by mouth every 6 (six) hours as needed for cough. 02/06/21   Minna Antis, MD  levETIRAcetam (KEPPRA) 500 MG tablet Take 1 tablet (500 mg total) by mouth 2 (two) times daily. 02/18/21 04/19/21  Ward, Layla Maw, DO  ondansetron (ZOFRAN ODT) 4 MG disintegrating tablet Take 1 tablet (4 mg total) by mouth every 8 (eight) hours as needed for nausea or vomiting. 02/06/21   Minna Antis, MD  phenytoin (DILANTIN) 300 MG ER capsule Take 1 capsule (300 mg total) by mouth at bedtime. 02/18/21 04/19/21  Ward, Layla Maw, DO     Allergies Patient has no known allergies.   No family history on file.  Social History Social History   Tobacco Use   Smoking status: Every Day    Packs/day: 0.50    Types: Cigarettes   Smokeless tobacco: Never  Vaping Use   Vaping Use: Never used  Substance Use Topics   Alcohol use: No   Drug use: No    Review of Systems  Constitutional:   No fever or chills.  ENT:   No sore throat. No rhinorrhea. Cardiovascular:   No chest pain or syncope. Respiratory:   No dyspnea or cough. Gastrointestinal:   Negative for abdominal pain, vomiting and diarrhea.  Musculoskeletal:   Negative for focal pain or swelling All other systems reviewed and are negative except as documented  above in ROS and HPI.  ____________________________________________   PHYSICAL EXAM:  VITAL SIGNS: ED Triage Vitals  Enc Vitals Group     BP 02/20/21 0923 118/77     Pulse Rate 02/20/21 0923 76     Resp 02/20/21 0923 18     Temp 02/20/21 0923 97.9 F (36.6 C)     Temp Source 02/20/21 0923 Oral     SpO2 02/20/21 0923 98 %     Weight 02/20/21 0918 180 lb (81.6 kg)     Height 02/20/21 0918 5\' 10"  (1.778 m)     Head Circumference --      Peak Flow --      Pain Score 02/20/21 0918 10     Pain Loc --      Pain Edu? --      Excl. in GC? --     Vital signs reviewed, nursing  assessments reviewed.   Constitutional:   Alert and oriented. Non-toxic appearance. Eyes:   Conjunctivae are normal. EOMI. PERRL. ENT      Head:   Normocephalic with small area of swelling over the left parietal scalp, no laceration..      Nose:   Normal, no epistaxis or septal hematoma, no  rhinorrhea.        Mouth/Throat:   Normal, no intraoral injury, no tongue laceration or abrasion.      Neck:   No meningismus. Full ROM.  No midline spinal tenderness Hematological/Lymphatic/Immunilogical:   No cervical lymphadenopathy. Cardiovascular:   RRR. Symmetric bilateral radial and DP pulses.  No murmurs. Cap refill less than 2 seconds. Respiratory:   Normal respiratory effort without tachypnea/retractions. Breath sounds are clear and equal bilaterally. No wheezes/rales/rhonchi. Gastrointestinal:   Soft and nontender. Non distended. There is no CVA tenderness.  No rebound, rigidity, or guarding. Genitourinary:   deferred Musculoskeletal:   Normal range of motion in all extremities. No joint effusions.  No lower extremity tenderness.  No edema. Neurologic:   Normal speech and language.  Motor grossly intact. No acute focal neurologic deficits are appreciated.  Skin:    Skin is warm, dry and intact. No rash noted.  No petechiae, purpura, or bullae.  ____________________________________________    LABS (pertinent positives/negatives) (all labs ordered are listed, but only abnormal results are displayed) Labs Reviewed  BASIC METABOLIC PANEL - Abnormal; Notable for the following components:      Result Value   Glucose, Bld 118 (*)    Calcium 8.6 (*)    All other components within normal limits  CBC - Abnormal; Notable for the following components:   RBC 3.65 (*)    Hemoglobin 11.6 (*)    HCT 34.6 (*)    All other components within normal limits  PHENYTOIN LEVEL, TOTAL   ____________________________________________   EKG  Interpreted by me Sinus rhythm rate of 71, normal axis and  intervals.  Normal QRS ST segments and T waves.  Slight diffuse J-point elevation consistent with benign early repolarization.  ____________________________________________    RADIOLOGY  CT Head Wo Contrast  Result Date: 02/20/2021 CLINICAL DATA:  Altered mental status. Head trauma. Unwitnessed seizure. EXAM: CT HEAD WITHOUT CONTRAST TECHNIQUE: Contiguous axial images were obtained from the base of the skull through the vertex without intravenous contrast. COMPARISON:  02/18/2021 FINDINGS: Brain: Normal appearing cerebral hemispheres and posterior fossa structures. Normal size and position of the ventricles. No intracranial hemorrhage, mass lesion or CT evidence of acute infarction. Vascular: No hyperdense vessel or unexpected calcification. Skull: Normal. Negative  for fracture or focal lesion. Sinuses/Orbits: Unremarkable. Other: None. IMPRESSION: Normal examination. Electronically Signed   By: Beckie Salts M.D.   On: 02/20/2021 10:54    ____________________________________________   PROCEDURES Procedures  ____________________________________________  DIFFERENTIAL DIAGNOSIS   Intracranial hemorrhage, recurrent seizure, medication noncompliance, electrolyte abnormality, dehydration  CLINICAL IMPRESSION / ASSESSMENT AND PLAN / ED COURSE  Medications ordered in the ED: Medications  phenytoin (DILANTIN) ER capsule 300 mg (has no administration in time range)  levETIRAcetam (KEPPRA) tablet 500 mg (has no administration in time range)    Pertinent labs & imaging results that were available during my care of the patient were reviewed by me and considered in my medical decision making (see chart for details).  JANE BROUGHTON was evaluated in Emergency Department on 02/20/2021 for the symptoms described in the history of present illness. He was evaluated in the context of the global COVID-19 pandemic, which necessitated consideration that the patient might be at risk for infection with the  SARS-CoV-2 virus that causes COVID-19. Institutional protocols and algorithms that pertain to the evaluation of patients at risk for COVID-19 are in a state of rapid change based on information released by regulatory bodies including the CDC and federal and state organizations. These policies and algorithms were followed during the patient's care in the ED.   Patient presents with left-sided headache after suspected seizure.  He is back to normal mental status now.  CT scan of the head unremarkable without evidence of intracranial bleeding or skull fracture.  Phenytoin level is 13, which may be residual from his medication load in the ED 2 days ago given his reported noncompliance with medications yesterday.  Serum labs otherwise unremarkable.  Will give a dose of phenytoin and Keppra in the ED, stable for discharge.  I emphasized to the patient the importance of following up with neurology for continued monitoring of your symptoms and medication management.      ____________________________________________   FINAL CLINICAL IMPRESSION(S) / ED DIAGNOSES    Final diagnoses:  Seizure-like activity [facility])     ED Discharge Orders     None       Portions of this note were generated with dragon dictation software. Dictation errors may occur despite best attempts at proofreading.    Sharman Cheek, MD 02/20/21 1220

## 2021-02-20 NOTE — ED Notes (Signed)
Patient transported to CT 

## 2021-02-26 ENCOUNTER — Emergency Department
Admission: EM | Admit: 2021-02-26 | Discharge: 2021-02-26 | Disposition: A | Discharge status: Home / Self Care | Payer: Medicaid Other | Attending: Emergency Medicine | Admitting: Emergency Medicine

## 2021-02-26 DIAGNOSIS — R569 Unspecified convulsions: Secondary | ICD-10-CM | POA: Diagnosis not present

## 2021-02-26 DIAGNOSIS — J45909 Unspecified asthma, uncomplicated: Secondary | ICD-10-CM | POA: Insufficient documentation

## 2021-02-26 DIAGNOSIS — Z7952 Long term (current) use of systemic steroids: Secondary | ICD-10-CM | POA: Diagnosis not present

## 2021-02-26 DIAGNOSIS — Y900 Blood alcohol level of less than 20 mg/100 ml: Secondary | ICD-10-CM | POA: Diagnosis not present

## 2021-02-26 DIAGNOSIS — F17210 Nicotine dependence, cigarettes, uncomplicated: Secondary | ICD-10-CM | POA: Insufficient documentation

## 2021-02-26 LAB — COMPREHENSIVE METABOLIC PANEL
ALT: 66 U/L — ABNORMAL HIGH (ref 0–44)
AST: 42 U/L — ABNORMAL HIGH (ref 15–41)
Albumin: 4.6 g/dL (ref 3.5–5.0)
Alkaline Phosphatase: 79 U/L (ref 38–126)
Anion gap: 5 (ref 5–15)
BUN: 9 mg/dL (ref 6–20)
CO2: 29 mmol/L (ref 22–32)
Calcium: 9.3 mg/dL (ref 8.9–10.3)
Chloride: 106 mmol/L (ref 98–111)
Creatinine, Ser: 1.14 mg/dL (ref 0.61–1.24)
GFR, Estimated: >60 mL/min (ref >60)
Glucose, Bld: 99 mg/dL (ref 70–99)
Potassium: 4.1 mmol/L (ref 3.5–5.1)
Sodium: 140 mmol/L (ref 135–145)
Total Bilirubin: 0.6 mg/dL (ref 0.3–1.2)
Total Protein: 8.1 g/dL (ref 6.5–8.1)

## 2021-02-26 LAB — CBC WITH DIFFERENTIAL/PLATELET
Abs Immature Granulocytes: 0.01 10*3/uL (ref 0.00–0.07)
Basophils Absolute: 0.1 10*3/uL (ref 0.0–0.1)
Basophils Relative: 1 %
Eosinophils Absolute: 0.2 10*3/uL (ref 0.0–0.5)
Eosinophils Relative: 3 %
HCT: 40.6 % (ref 39.0–52.0)
Hemoglobin: 13.3 g/dL (ref 13.0–17.0)
Immature Granulocytes: 0 %
Lymphocytes Relative: 47 %
Lymphs Abs: 3.4 10*3/uL (ref 0.7–4.0)
MCH: 30.8 pg (ref 26.0–34.0)
MCHC: 32.8 g/dL (ref 30.0–36.0)
MCV: 94 fL (ref 80.0–100.0)
Monocytes Absolute: 0.3 10*3/uL (ref 0.1–1.0)
Monocytes Relative: 4 %
Neutro Abs: 3.2 10*3/uL (ref 1.7–7.7)
Neutrophils Relative %: 45 %
Platelets: 396 10*3/uL (ref 150–400)
RBC: 4.32 MIL/uL (ref 4.22–5.81)
RDW: 11.7 % (ref 11.5–15.5)
WBC: 7.2 10*3/uL (ref 4.0–10.5)
nRBC: 0 % (ref 0.0–0.2)

## 2021-02-26 LAB — ETHANOL: Alcohol, Ethyl (B): <10 mg/dL (ref <10)

## 2021-02-26 LAB — PHENYTOIN LEVEL, TOTAL: Phenytoin Lvl: 9.9 ug/mL — ABNORMAL LOW (ref 10.0–20.0)

## 2021-02-26 MED ORDER — LEVETIRACETAM IN NACL 1000 MG/100ML IV SOLN
1000.0000 mg | Freq: Once | INTRAVENOUS | Status: AC
  Administered 2021-02-26: 1000 mg via INTRAVENOUS
  Filled 2021-02-26: qty 100

## 2021-02-26 MED ORDER — SODIUM CHLORIDE 0.9 % IV SOLN: 2000.0000 mg | Freq: Once | INTRAVENOUS | Status: DC

## 2021-02-26 NOTE — ED Provider Notes (Signed)
[facility] Emergency Department Provider Note  ____________________________________________   Event Date/Time   First MD Initiated Contact with Patient 02/26/21 [phone]     (approximate)  I have reviewed the triage vital signs and the nursing notes.   HISTORY  Chief Complaint Seizures  Level 5 caveat: Patient is a limited historian  HPI Martin Cooper is a 35 y.o. male with medical history as listed below who presents for evaluation of possible seizures.  The patient and his girlfriend and mother are a little bit vague about what happened, but reportedly he was watching a movie and then was not responding to the girlfriend.  Then he started having some shaking of one of his legs.  This is consistent with what they have interpreted in the past his seizures.  He has been seen multiple times recently in the emergency department for seizure-like activity.  Most recently it was noted that his Dilantin level was undetectable.  Each time he has been in the emergency department he has been given some additional medications but it is unclear if he is taking them at home.  He claims that he has been compliant with his medications.  He also denies any other drug or alcohol use.  No recent trauma.  No infectious symptoms including no fever, sore throat, chest pain, shortness of breath, nausea, vomiting, nor abdominal pain.  Nothing in particular made the symptoms better or worse.  He may or may not have had seizure-like activity a couple of times at home, but it is unclear whether this represented shaking episodes or just not responding to family.     Past Medical History:  Diagnosis Date   Asthma    Seizures (HCC)     There are no problems to display for this patient.   No past surgical history on file.  Prior to Admission medications   Medication Sig Start Date End Date Taking? Authorizing Provider  albuterol (PROVENTIL HFA;VENTOLIN HFA) 108 (90 Base) MCG/ACT  inhaler Inhale 2 puffs into the lungs every 4 (four) hours as needed for wheezing or shortness of breath. 12/30/16   Cuthriell, Delorise Royals, PA-C  brompheniramine-pseudoephedrine-DM 30-2-10 MG/5ML syrup Take 10 mLs by mouth 4 (four) times daily as needed. Patient not taking: No sig reported 12/30/16   Cuthriell, Delorise Royals, PA-C  fluticasone (FLONASE) 50 MCG/ACT nasal spray Place 1 spray into both nostrils 2 (two) times daily. 12/30/16   Cuthriell, Delorise Royals, PA-C  gabapentin (NEURONTIN) 300 MG capsule Take 1 capsule by mouth daily.    [provider]  guaiFENesin-codeine 100-10 MG/5ML syrup Take 5 mLs by mouth every 6 (six) hours as needed for cough. 02/06/21   Minna Antis, MD  levETIRAcetam (KEPPRA) 500 MG tablet Take 1 tablet (500 mg total) by mouth 2 (two) times daily. 02/18/21 04/19/21  Ward, Layla Maw, DO  ondansetron (ZOFRAN ODT) 4 MG disintegrating tablet Take 1 tablet (4 mg total) by mouth every 8 (eight) hours as needed for nausea or vomiting. 02/06/21   Minna Antis, MD  phenytoin (DILANTIN) 300 MG ER capsule Take 1 capsule (300 mg total) by mouth at bedtime. 02/18/21 04/19/21  Ward, Layla Maw, DO    Allergies Patient has no known allergies.  No family history on file.  Social History Social History   Tobacco Use   Smoking status: Every Day    Packs/day: 0.50    Types: Cigarettes   Smokeless tobacco: Never  Vaping Use   Vaping Use: Never used  Substance Use Topics   Alcohol use: No   Drug use: No    Review of Systems Constitutional: No fever/chills Eyes: No visual changes. ENT: No sore throat. Cardiovascular: Denies chest pain. Respiratory: Denies shortness of breath. Gastrointestinal: No abdominal pain.  No nausea, no vomiting.  No diarrhea.  No constipation. Genitourinary: Negative for dysuria. Musculoskeletal: Negative for neck pain.  Negative for back pain. Integumentary: Negative for rash. Neurological: Positive for seizure-like activity.  No  focal neurological deficits at this time.   ____________________________________________   PHYSICAL EXAM:  VITAL SIGNS: ED Triage Vitals  Enc Vitals Group     BP 02/26/21 0239 117/83     Pulse Rate 02/26/21 0239 63     Resp 02/26/21 0239 18     Temp 02/26/21 0239 98.9 F (37.2 C)     Temp Source 02/26/21 0239 Oral     SpO2 02/26/21 0239 100 %     Weight 02/26/21 0240 82 kg (180 lb 12.4 oz)     Height 02/26/21 0240 1.778 m (5\' 10" )     Head Circumference --      Peak Flow --      Pain Score --      Pain Loc --      Pain Edu? --      Excl. in GC? --     Constitutional: Alert and oriented.  Eyes: Conjunctivae are normal.  Head: Atraumatic. Nose: No congestion/rhinnorhea. Mouth/Throat: Chronically very poor dentition but without any acute abnormalities at this time. Neck: No stridor.  No meningeal signs.   Cardiovascular: Normal rate, regular rhythm. Good peripheral circulation. Respiratory: Normal respiratory effort.  No retractions. Gastrointestinal: Soft and nontender. No distention.  Musculoskeletal: No lower extremity tenderness nor edema. No gross deformities of extremities. Neurologic:  Normal speech and language. No gross focal neurologic deficits are appreciated.  Skin:  Skin is warm, dry and intact. Psychiatric: Mood and affect are somewhat abnormal but seem to be at baseline.  Very friendly and communicative.  No apparent distress or concern.  ____________________________________________   LABS (all labs ordered are listed, but only abnormal results are displayed)  Labs Reviewed  COMPREHENSIVE METABOLIC PANEL - Abnormal; Notable for the following components:      Result Value   AST 42 (*)    ALT 66 (*)    All other components within normal limits  PHENYTOIN LEVEL, TOTAL - Abnormal; Notable for the following components:   Phenytoin Lvl 9.9 (*)    All other components within normal limits  CBC WITH DIFFERENTIAL/PLATELET  ETHANOL  URINE DRUG SCREEN,  QUALITATIVE ([facility] ONLY)  LEVETIRACETAM LEVEL   ____________________________________________  EKG  ED ECG REPORT I, , the attending physician, personally viewed and interpreted this ECG.  Date: 02/26/2021 EKG Time: 2:42 Rate: 60 Rhythm: normal sinus rhythm QRS Axis: normal Intervals: normal ST/T Wave abnormalities: There is some ST segment elevation in the anterior leads consistent with early repolarization there is some ST segment elevation in the anterior leads and lead II which is most consistent with early repolarization Narrative Interpretation: no evidence of acute ischemia  ____________________________________________   INITIAL IMPRESSION / MDM / ASSESSMENT AND PLAN / ED COURSE  As part of my medical decision making, I reviewed the following data within the electronic MEDICAL RECORD NUMBER History obtained from family, Nursing notes reviewed and incorporated, Labs reviewed , EKG interpreted , Old chart reviewed, and Notes from prior ED visits   Differential diagnosis includes, but is not limited to,  seizure-like activity, nonepileptic seizures, epileptic seizures, medication noncompliance, metabolic or electrolyte abnormality, medication or drug side effect, unclear secondary gain.  Patient has been observed in the emergency department for several hours.  He is on his 15th ED visit in 6 months for similar symptoms.  He has required no admissions.  His phenytoin level tonight is 9.9 which is just below the therapeutic limit.  I provided Keppra 2 g IV as an additional loading dose.  Essentially normal CMP and CBC other than a very slight AST and ALT elevation which is unlikely to be clinically significant in the absence of nausea, vomiting, and abdominal pain. .  Ethanol level is negative.  Patient is well and in no distress, asking for food, laughing and joking, no apparent concerns.  No indication for imaging.  I counseled the patient to take his medications and follow-up  with his doctor.  No indication of an emergent medical condition at this time.  I gave my usual and customary return precautions.     ____________________________________________  FINAL CLINICAL IMPRESSION(S) / ED DIAGNOSES  Final diagnoses:  Seizure-like activity (HCC)     MEDICATIONS GIVEN DURING THIS VISIT:  Medications  levETIRAcetam (KEPPRA) IVPB 1000 mg/100 mL premix (0 mg Intravenous Stopped 02/26/21 0404)    Followed by  levETIRAcetam (KEPPRA) IVPB 1000 mg/100 mL premix (0 mg Intravenous Stopped 02/26/21 0403)     ED Discharge Orders     None        Note:  This document was prepared using Dragon voice recognition software and may include unintentional dictation errors.   Loleta Rose, MD 02/26/21 [phone]

## 2021-02-26 NOTE — ED Triage Notes (Signed)
Patient from with EMS for x 2 witnessed seizures; family at bedside; A/ O x 4; respirations even non-labored; skin warm/ dry; malodorous, denies biting tongue or incontinence; EMS reports CBG 118 mg/dL, Vital WNL; post-itcal upon arrival

## 2021-02-27 LAB — LEVETIRACETAM LEVEL: Levetiracetam Lvl: 7.3 ug/mL — ABNORMAL LOW (ref 10.0–40.0)

## 2021-03-03 ENCOUNTER — Other Ambulatory Visit: Payer: Self-pay

## 2021-03-03 ENCOUNTER — Emergency Department: Payer: Medicaid Other

## 2021-03-03 DIAGNOSIS — W01198A Fall on same level from slipping, tripping and stumbling with subsequent striking against other object, initial encounter: Secondary | ICD-10-CM | POA: Diagnosis not present

## 2021-03-03 DIAGNOSIS — R569 Unspecified convulsions: Secondary | ICD-10-CM | POA: Diagnosis present

## 2021-03-03 DIAGNOSIS — M25571 Pain in right ankle and joints of right foot: Secondary | ICD-10-CM | POA: Diagnosis not present

## 2021-03-03 DIAGNOSIS — F17210 Nicotine dependence, cigarettes, uncomplicated: Secondary | ICD-10-CM | POA: Diagnosis not present

## 2021-03-03 DIAGNOSIS — J45909 Unspecified asthma, uncomplicated: Secondary | ICD-10-CM | POA: Insufficient documentation

## 2021-03-03 DIAGNOSIS — Z7952 Long term (current) use of systemic steroids: Secondary | ICD-10-CM | POA: Insufficient documentation

## 2021-03-03 NOTE — ED Notes (Signed)
Pt stepdad up to desk stating pt "feels like he is having a seizure." Nettie Elm, RN made aware. VS obtained.

## 2021-03-03 NOTE — ED Triage Notes (Signed)
Pt states he had a seizure today injuring head and right ankle. Pt states he takes keppra. No obvious head injury noted.

## 2021-03-04 ENCOUNTER — Emergency Department
Admission: EM | Admit: 2021-03-04 | Discharge: 2021-03-04 | Disposition: A | Discharge status: Home / Self Care | Payer: Medicaid Other | Attending: Emergency Medicine | Admitting: Emergency Medicine

## 2021-03-04 DIAGNOSIS — R569 Unspecified convulsions: Secondary | ICD-10-CM

## 2021-03-04 DIAGNOSIS — M25571 Pain in right ankle and joints of right foot: Secondary | ICD-10-CM

## 2021-03-04 NOTE — ED Notes (Signed)
Patient verbalizes understanding of discharge instructions. Opportunity for questioning and answers were provided. Armband removed by staff, pt discharged from ED. Wheeled out to lobby  

## 2021-03-04 NOTE — ED Provider Notes (Signed)
[facility] Emergency Department Provider Note  ____________________________________________   Event Date/Time   First MD Initiated Contact with Patient 03/04/21 [phone]     (approximate)  I have reviewed the triage vital signs and the nursing notes.   HISTORY  Chief Complaint Seizures  Level 5 caveat: Patient is a vague historian  HPI Martin Cooper is a 35 y.o. male who was on his 16th visit in 6 months to the emergency department for seizure-like activity.  I saw him for similar issues less than a week ago.  Today he claims that he had a seizure episode and fell, striking the back left side of his head and injuring his right ankle.  The pain is persistent.  He was not confused after the seizure-like episode which apparently involves some involuntary movements of either his arms or his legs.  He is not sure if he lost consciousness.  No nausea nor vomiting, no chest pain or shortness of breath.  He claims he is compliant with his Dilantin and his Keppra.     Past Medical History:  Diagnosis Date   Asthma    Seizures (HCC)     There are no problems to display for this patient.   No past surgical history on file.  Prior to Admission medications   Medication Sig Start Date End Date Taking? Authorizing Provider  albuterol (PROVENTIL HFA;VENTOLIN HFA) 108 (90 Base) MCG/ACT inhaler Inhale 2 puffs into the lungs every 4 (four) hours as needed for wheezing or shortness of breath. 12/30/16   Cuthriell, Delorise Royals, PA-C  brompheniramine-pseudoephedrine-DM 30-2-10 MG/5ML syrup Take 10 mLs by mouth 4 (four) times daily as needed. Patient not taking: No sig reported 12/30/16   Cuthriell, Delorise Royals, PA-C  fluticasone (FLONASE) 50 MCG/ACT nasal spray Place 1 spray into both nostrils 2 (two) times daily. 12/30/16   Cuthriell, Delorise Royals, PA-C  gabapentin (NEURONTIN) 300 MG capsule Take 1 capsule by mouth daily.    [provider]  guaiFENesin-codeine 100-10  MG/5ML syrup Take 5 mLs by mouth every 6 (six) hours as needed for cough. 02/06/21   Minna Antis, MD  levETIRAcetam (KEPPRA) 500 MG tablet Take 1 tablet (500 mg total) by mouth 2 (two) times daily. 02/18/21 04/19/21  Ward, Layla Maw, DO  ondansetron (ZOFRAN ODT) 4 MG disintegrating tablet Take 1 tablet (4 mg total) by mouth every 8 (eight) hours as needed for nausea or vomiting. 02/06/21   Minna Antis, MD  phenytoin (DILANTIN) 300 MG ER capsule Take 1 capsule (300 mg total) by mouth at bedtime. 02/18/21 04/19/21  Ward, Layla Maw, DO    Allergies Patient has no known allergies.  No family history on file.  Social History Social History   Tobacco Use   Smoking status: Every Day    Packs/day: 0.50    Types: Cigarettes   Smokeless tobacco: Never  Vaping Use   Vaping Use: Never used  Substance Use Topics   Alcohol use: No   Drug use: No    Review of Systems Constitutional: No fever/chills Eyes: No visual changes. ENT: No sore throat. Cardiovascular: Denies chest pain. Respiratory: Denies shortness of breath. Gastrointestinal: No abdominal pain.  No nausea, no vomiting.  No diarrhea.  No constipation. Genitourinary: Negative for dysuria. Musculoskeletal: Pain in right ankle after fall.  Pain in the left side of the back of his head after fall. Integumentary: Negative for rash. Neurological: Seizure-like episode.  Negative for headaches, focal weakness or numbness.   ____________________________________________  PHYSICAL EXAM:  VITAL SIGNS: ED Triage Vitals  Enc Vitals Group     BP 03/03/21 2045 108/68     Pulse Rate 03/03/21 2045 86     Resp 03/03/21 2045 16     Temp 03/03/21 2052 99.3 F (37.4 C)     Temp Source 03/03/21 2045 Oral     SpO2 03/03/21 2045 96 %     Weight 03/03/21 2045 82 kg (180 lb 12.4 oz)     Height 03/03/21 2045 1.778 m (5\' 10" )     Head Circumference --      Peak Flow --      Pain Score 03/03/21 2045 10     Pain Loc --      Pain Edu?  --      Excl. in GC? --     Constitutional: Alert and oriented.  Patient is at the baseline as I saw him on his last ED visit. Eyes: Conjunctivae are normal.  Head: Atraumatic.  Patient indicates an area of tenderness on his occiput but I cannot see it or palpate a hematoma. Nose: No congestion/rhinnorhea. Mouth/Throat: Patient is wearing a mask. Neck: No stridor.  No meningeal signs.   Cardiovascular: Normal rate, regular rhythm. Good peripheral circulation. Respiratory: Normal respiratory effort.  No retractions. Gastrointestinal: Soft and nontender. No distention.  Musculoskeletal: No tenderness to palpation of the cervical spine.  No gross deformities including effusion of his right ankle.  He is reporting some pain near the back of the foot towards the heel but he has no point tenderness of the calcaneus.  No visible abnormality. Neurologic: Slightly slurred speech and language at baseline, similar to on his prior visit. No gross focal neurologic deficits are appreciated.  Skin:  Skin is warm, dry and intact.   ____________________________________________    RADIOLOGY I, Loleta Rose, personally viewed and evaluated these images (plain radiographs) as part of my medical decision making, as well as reviewing the written report by the radiologist.  ED MD interpretation: No acute abnormalities identified on head CT, cervical spine CT, nor ankle x-rays.  Official radiology report(s): DG Ankle Complete Right  Result Date: 03/03/2021 CLINICAL DATA:  Right ankle injury. EXAM: RIGHT ANKLE - COMPLETE 3+ VIEW COMPARISON:  Study of 01/29/2021. FINDINGS: There is no evidence of fracture, dislocation, or joint effusion. There is no evidence of arthropathy or other focal bone abnormality. Soft tissues are unremarkable. IMPRESSION: No evidence of fractures or interval changes. Electronically Signed   By: Almira Bar M.D.   On: 03/03/2021 21:13   CT HEAD WO CONTRAST ( )  Result Date:  03/03/2021 CLINICAL DATA:  Neck trauma, dangerous injury mechanism (Age 48-64y). Pt states he had a seizure today injuring head and right ankle. Pt states he takes keppra. EXAM: CT HEAD WITHOUT CONTRAST CT CERVICAL SPINE WITHOUT CONTRAST TECHNIQUE: Multidetector CT imaging of the head and cervical spine was performed following the standard protocol without intravenous contrast. Multiplanar CT image reconstructions of the cervical spine were also generated. COMPARISON:  None. FINDINGS: CT HEAD FINDINGS Brain: No evidence of large-territorial acute infarction. No parenchymal hemorrhage. No mass lesion. No extra-axial collection. No mass effect or midline shift. No hydrocephalus. Basilar cisterns are patent. Vascular: No hyperdense vessel. Skull: No acute fracture or focal lesion. Sinuses/Orbits: Paranasal sinuses and mastoid air cells are clear. The orbits are unremarkable. Other: None. CT CERVICAL SPINE FINDINGS Alignment: Reversal normal cervical lordosis likely due to positioning. Skull base and vertebrae: Degenerative changes of the C4 through C7  levels. No acute fracture. No aggressive appearing focal osseous lesion or focal pathologic process. Soft tissues and spinal canal: No prevertebral fluid or swelling. No visible canal hematoma. Upper chest: Unremarkable. Other: None. IMPRESSION: 1. No acute intracranial abnormality. 2. No acute displaced fracture or traumatic listhesis of the cervical spine. Electronically Signed   By: Tish Frederickson M.D.   On: 03/03/2021 21:13   CT Cervical Spine Wo Contrast  Result Date: 03/03/2021 CLINICAL DATA:  Neck trauma, dangerous injury mechanism (Age 52-64y). Pt states he had a seizure today injuring head and right ankle. Pt states he takes keppra. EXAM: CT HEAD WITHOUT CONTRAST CT CERVICAL SPINE WITHOUT CONTRAST TECHNIQUE: Multidetector CT imaging of the head and cervical spine was performed following the standard protocol without intravenous contrast. Multiplanar CT  image reconstructions of the cervical spine were also generated. COMPARISON:  None. FINDINGS: CT HEAD FINDINGS Brain: No evidence of large-territorial acute infarction. No parenchymal hemorrhage. No mass lesion. No extra-axial collection. No mass effect or midline shift. No hydrocephalus. Basilar cisterns are patent. Vascular: No hyperdense vessel. Skull: No acute fracture or focal lesion. Sinuses/Orbits: Paranasal sinuses and mastoid air cells are clear. The orbits are unremarkable. Other: None. CT CERVICAL SPINE FINDINGS Alignment: Reversal normal cervical lordosis likely due to positioning. Skull base and vertebrae: Degenerative changes of the C4 through C7 levels. No acute fracture. No aggressive appearing focal osseous lesion or focal pathologic process. Soft tissues and spinal canal: No prevertebral fluid or swelling. No visible canal hematoma. Upper chest: Unremarkable. Other: None. IMPRESSION: 1. No acute intracranial abnormality. 2. No acute displaced fracture or traumatic listhesis of the cervical spine. Electronically Signed   By: Tish Frederickson M.D.   On: 03/03/2021 21:13    ____________________________________________   PROCEDURES   Procedure(s) performed (including Critical Care):  Procedures   ____________________________________________   INITIAL IMPRESSION / MDM / ASSESSMENT AND PLAN / ED COURSE  As part of my medical decision making, I reviewed the following data within the electronic MEDICAL RECORD NUMBER Nursing notes reviewed and incorporated, Old chart reviewed, Radiograph reviewed , and Notes from prior ED visits   Differential diagnosis includes, but is not limited to, seizure, nonepileptic seizure, conversion disorder, factitious disorder, malingering, secondary gain, medication or drug side effect, medication noncompliance, electrolyte or metabolic abnormality.  Vital signs are stable.  Patient is at his baseline, no confusion, no neurological deficits.  He reports some  musculoskeletal pain in his right heel and the left back of his head but there is no sign of trauma in either location.  No indication to repeat lab work.  His antiepileptic medications are always on the low end but he always receives loading doses in the ED.  He has been in the emergency department for nearly 7 hours due to overwhelming ED and hospital patient volumes and limited staffing in room capacity, and he has had no additional episodes.  I updated him about the reassuring results and encouraged him to continue taking his medications which she states he has been doing.  I also provided him with the name and number for [facility] so that he can follow-up and establish new primary care doctor because he says he has been trying to call his existing primary care doctor and has not been able to get through.  I gave my usual and customary return precautions.       Clinical Course as of 03/04/21 0323  Sat Mar 04, 2021  0259 DG Ankle Complete Right [CF]  Clinical Course User Index [CF] Loleta Rose, MD     ____________________________________________  FINAL CLINICAL IMPRESSION(S) / ED DIAGNOSES  Final diagnoses:  Seizure-like activity (HCC)  Acute right ankle pain     MEDICATIONS GIVEN DURING THIS VISIT:  Medications - No data to display   ED Discharge Orders     None        Note:  This document was prepared using Dragon voice recognition software and may include unintentional dictation errors.   Loleta Rose, MD 03/04/21 [phone]

## 2021-03-05 ENCOUNTER — Other Ambulatory Visit: Payer: Self-pay

## 2021-03-05 ENCOUNTER — Emergency Department
Admission: EM | Admit: 2021-03-05 | Discharge: 2021-03-05 | Disposition: A | Discharge status: Home / Self Care | Payer: Medicaid Other | Attending: Student in an Organized Health Care Education/Training Program | Admitting: Student in an Organized Health Care Education/Training Program

## 2021-03-05 ENCOUNTER — Encounter: Payer: Self-pay | Admitting: Emergency Medicine

## 2021-03-05 DIAGNOSIS — Z5321 Procedure and treatment not carried out due to patient leaving prior to being seen by health care provider: Secondary | ICD-10-CM | POA: Insufficient documentation

## 2021-03-05 DIAGNOSIS — R569 Unspecified convulsions: Secondary | ICD-10-CM | POA: Insufficient documentation

## 2021-03-05 DIAGNOSIS — R1084 Generalized abdominal pain: Secondary | ICD-10-CM | POA: Insufficient documentation

## 2021-03-05 LAB — CBC
HCT: 38.1 % — ABNORMAL LOW (ref 39.0–52.0)
Hemoglobin: 12.6 g/dL — ABNORMAL LOW (ref 13.0–17.0)
MCH: 30.5 pg (ref 26.0–34.0)
MCHC: 33.1 g/dL (ref 30.0–36.0)
MCV: 92.3 fL (ref 80.0–100.0)
Platelets: 229 10*3/uL (ref 150–400)
RBC: 4.13 MIL/uL — ABNORMAL LOW (ref 4.22–5.81)
RDW: 11.7 % (ref 11.5–15.5)
WBC: 2.7 10*3/uL — ABNORMAL LOW (ref 4.0–10.5)
nRBC: 0 % (ref 0.0–0.2)

## 2021-03-05 LAB — URINALYSIS, ROUTINE W REFLEX MICROSCOPIC
Bacteria, UA: NONE SEEN
Bilirubin Urine: NEGATIVE
Glucose, UA: NEGATIVE mg/dL
Hgb urine dipstick: NEGATIVE
Ketones, ur: 5 mg/dL — AB
Leukocytes,Ua: NEGATIVE
Nitrite: NEGATIVE
Protein, ur: 100 mg/dL — AB
Specific Gravity, Urine: 1.033 — ABNORMAL HIGH (ref 1.005–1.030)
pH: 5 (ref 5.0–8.0)

## 2021-03-05 LAB — COMPREHENSIVE METABOLIC PANEL
ALT: 164 U/L — ABNORMAL HIGH (ref 0–44)
AST: 195 U/L — ABNORMAL HIGH (ref 15–41)
Albumin: 4.2 g/dL (ref 3.5–5.0)
Alkaline Phosphatase: 88 U/L (ref 38–126)
Anion gap: 6 (ref 5–15)
BUN: 9 mg/dL (ref 6–20)
CO2: 28 mmol/L (ref 22–32)
Calcium: 8.9 mg/dL (ref 8.9–10.3)
Chloride: 100 mmol/L (ref 98–111)
Creatinine, Ser: 1.09 mg/dL (ref 0.61–1.24)
GFR, Estimated: >60 mL/min (ref >60)
Glucose, Bld: 104 mg/dL — ABNORMAL HIGH (ref 70–99)
Potassium: 4.2 mmol/L (ref 3.5–5.1)
Sodium: 134 mmol/L — ABNORMAL LOW (ref 135–145)
Total Bilirubin: 0.7 mg/dL (ref 0.3–1.2)
Total Protein: 8 g/dL (ref 6.5–8.1)

## 2021-03-05 LAB — LIPASE, BLOOD: Lipase: 30 U/L (ref 11–51)

## 2021-03-05 LAB — TROPONIN I (HIGH SENSITIVITY): Troponin I (High Sensitivity): 6 ng/L (ref <18)

## 2021-03-05 NOTE — ED Triage Notes (Signed)
Pt c/o RUQ abdominal pain that started approx 1 hour prior to arrival. Pt denies urinary symptoms, N/V/D. Per pt, he had gas and then the pain started.

## 2021-03-05 NOTE — ED Triage Notes (Signed)
First RN Note: Pt to ED via [facility] with c/o central generalized abd pain worse with palpation, denies N/V/D. Per EMS pt has seizures when his pain increases, per EMS pt reported seizure en route however remained A&O and talking.    126/90 70Hr 100% RA

## 2021-03-06 ENCOUNTER — Telehealth: Payer: Self-pay | Admitting: Emergency Medicine

## 2021-03-06 NOTE — Telephone Encounter (Signed)
Called patient to check on follow up plans.  He was on phone, but then his family member got on.  Says he has applied for medicaid, but will not get it for some time.  He is currently on his seizure med, but routinely obtains this via ED.  I advised that [facility] get [facility]--then they can address his seizure dx as well as any other problems. She agrees and understands where it is located.

## 2021-03-14 ENCOUNTER — Other Ambulatory Visit: Payer: Self-pay

## 2021-03-14 ENCOUNTER — Emergency Department: Payer: Medicaid Other

## 2021-03-14 ENCOUNTER — Emergency Department
Admission: EM | Admit: 2021-03-14 | Discharge: 2021-03-14 | Disposition: A | Discharge status: Home / Self Care | Payer: Medicaid Other | Attending: Student in an Organized Health Care Education/Training Program | Admitting: Student in an Organized Health Care Education/Training Program

## 2021-03-14 DIAGNOSIS — Z7951 Long term (current) use of inhaled steroids: Secondary | ICD-10-CM | POA: Insufficient documentation

## 2021-03-14 DIAGNOSIS — J45909 Unspecified asthma, uncomplicated: Secondary | ICD-10-CM | POA: Diagnosis not present

## 2021-03-14 DIAGNOSIS — R569 Unspecified convulsions: Secondary | ICD-10-CM

## 2021-03-14 DIAGNOSIS — F17210 Nicotine dependence, cigarettes, uncomplicated: Secondary | ICD-10-CM | POA: Diagnosis not present

## 2021-03-14 LAB — BASIC METABOLIC PANEL
Anion gap: 4 — ABNORMAL LOW (ref 5–15)
BUN: 8 mg/dL (ref 6–20)
CO2: 27 mmol/L (ref 22–32)
Calcium: 8.6 mg/dL — ABNORMAL LOW (ref 8.9–10.3)
Chloride: 104 mmol/L (ref 98–111)
Creatinine, Ser: 1.11 mg/dL (ref 0.61–1.24)
GFR, Estimated: >60 mL/min (ref >60)
Glucose, Bld: 128 mg/dL — ABNORMAL HIGH (ref 70–99)
Potassium: 3.7 mmol/L (ref 3.5–5.1)
Sodium: 135 mmol/L (ref 135–145)

## 2021-03-14 LAB — CBC
HCT: 36.6 % — ABNORMAL LOW (ref 39.0–52.0)
Hemoglobin: 11.8 g/dL — ABNORMAL LOW (ref 13.0–17.0)
MCH: 30.9 pg (ref 26.0–34.0)
MCHC: 32.2 g/dL (ref 30.0–36.0)
MCV: 95.8 fL (ref 80.0–100.0)
Platelets: 289 10*3/uL (ref 150–400)
RBC: 3.82 MIL/uL — ABNORMAL LOW (ref 4.22–5.81)
RDW: 12.2 % (ref 11.5–15.5)
WBC: 4.5 10*3/uL (ref 4.0–10.5)
nRBC: 0 % (ref 0.0–0.2)

## 2021-03-14 LAB — PHENYTOIN LEVEL, TOTAL: Phenytoin Lvl: 5.4 ug/mL — ABNORMAL LOW (ref 10.0–20.0)

## 2021-03-14 MED ORDER — LEVETIRACETAM 500 MG PO TABS: 1000.0000 mg | ORAL_TABLET | Freq: Two times a day (BID) | ORAL | 1 refills | Status: DC

## 2021-03-14 MED ORDER — PHENYTOIN SODIUM EXTENDED 30 MG PO CAPS
30.0000 mg | ORAL_CAPSULE | Freq: Every day | ORAL | 2 refills | Status: DC
  Filled 2021-03-14: qty 90, 90d supply, fill #0
  Filled 2021-03-16: qty 30, 30d supply, fill #0

## 2021-03-14 MED ORDER — PHENYTOIN SODIUM EXTENDED 300 MG PO CAPS
300.0000 mg | ORAL_CAPSULE | Freq: Every day | ORAL | 1 refills | Status: DC
  Filled 2021-03-14 – 2021-03-16 (×2): qty 30, 30d supply, fill #0

## 2021-03-14 MED ORDER — LEVETIRACETAM 500 MG PO TABS
500.0000 mg | ORAL_TABLET | Freq: Once | ORAL | Status: AC
  Administered 2021-03-14: 500 mg via ORAL

## 2021-03-14 MED ORDER — LEVETIRACETAM 500 MG PO TABS
1000.0000 mg | ORAL_TABLET | Freq: Two times a day (BID) | ORAL | 1 refills | Status: DC
  Filled 2021-03-14: qty 120, 30d supply, fill #0

## 2021-03-14 MED ORDER — PHENYTOIN 125 MG/5ML PO SUSP
400.0000 mg | Freq: Once | ORAL | Status: AC
  Administered 2021-03-14: 400 mg via ORAL
  Filled 2021-03-14: qty 16

## 2021-03-14 NOTE — ED Notes (Signed)
Called pharmacy at this time in regards to medications. States they are pulling it up now and sending it.

## 2021-03-14 NOTE — ED Triage Notes (Signed)
Pt to ED [facility] from home, reports multiple seizures this am. Unsure how long lasted. Hx of seizures. Unsure of name of medications taken for seizures. Cbg 300.  Pt alert and oriented, does not appear post ictal

## 2021-03-14 NOTE — Discharge Instructions (Addendum)
We have increased your medications.  Please start taking 330 mg Dilantin every night and 1000mg  Keppra twice daily.  Please follow up with neurology.

## 2021-03-14 NOTE — ED Provider Notes (Signed)
[facility] Emergency Department Provider Note    Event Date/Time   First MD Initiated Contact with Patient 03/14/21 [phone]     (approximate)  I have reviewed the triage vital signs and the nursing notes.   HISTORY  Chief Complaint Seizures    HPI Martin Cooper is a 35 y.o. male with a history of asthma as well as seizures on Dilantin as well as Keppra states he has been compliant with medications who presented to the ER with several seizure-like episodes that occurred this morning.  States that on One episodes he did hit his right hand.  Denies hitting his head.  States has been compliant with his medications.  Denies any illicit drug use.  Past Medical History:  Diagnosis Date   Asthma    Seizures (HCC)    No family history on file. No past surgical history on file. There are no problems to display for this patient.     Prior to Admission medications   Medication Sig Start Date End Date Taking? Authorizing Provider  phenytoin (DILANTIN) 30 MG ER capsule Take 1 capsule (30 mg total) by mouth at bedtime. 03/14/21 03/14/22 Yes Willy Eddy, MD  albuterol (PROVENTIL HFA;VENTOLIN HFA) 108 (90 Base) MCG/ACT inhaler Inhale 2 puffs into the lungs every 4 (four) hours as needed for wheezing or shortness of breath. 12/30/16   Cuthriell, Delorise Royals, PA-C  brompheniramine-pseudoephedrine-DM 30-2-10 MG/5ML syrup Take 10 mLs by mouth 4 (four) times daily as needed. Patient not taking: No sig reported 12/30/16   Cuthriell, Delorise Royals, PA-C  fluticasone (FLONASE) 50 MCG/ACT nasal spray Place 1 spray into both nostrils 2 (two) times daily. 12/30/16   Cuthriell, Delorise Royals, PA-C  gabapentin (NEURONTIN) 300 MG capsule Take 1 capsule by mouth daily.    [provider]  guaiFENesin-codeine 100-10 MG/5ML syrup Take 5 mLs by mouth every 6 (six) hours as needed for cough. 02/06/21   Minna Antis, MD  levETIRAcetam (KEPPRA) 500 MG tablet Take 2 tablets  (1,000 mg total) by mouth 2 (two) times daily. 03/14/21 05/13/21  Willy Eddy, MD  ondansetron (ZOFRAN ODT) 4 MG disintegrating tablet Take 1 tablet (4 mg total) by mouth every 8 (eight) hours as needed for nausea or vomiting. 02/06/21   Minna Antis, MD  phenytoin (DILANTIN) 300 MG ER capsule Take 1 capsule (300 mg total) by mouth at bedtime. 03/14/21 05/13/21  Willy Eddy, MD    Allergies Patient has no known allergies.    Social History Social History   Tobacco Use   Smoking status: Every Day    Packs/day: 0.50    Types: Cigarettes   Smokeless tobacco: Never  Vaping Use   Vaping Use: Never used  Substance Use Topics   Alcohol use: No   Drug use: No    Review of Systems Patient denies headaches, rhinorrhea, blurry vision, numbness, shortness of breath, chest pain, edema, cough, abdominal pain, nausea, vomiting, diarrhea, dysuria, fevers, rashes or hallucinations unless otherwise stated above in HPI. ____________________________________________   PHYSICAL EXAM:  VITAL SIGNS: Vitals:   03/14/21 1113 03/14/21 1121  BP:  102/77  Pulse: 60 68  Resp: 20 18  Temp:    SpO2: 100% 100%    Constitutional: Alert and oriented.  Eyes: Conjunctivae are normal.  Head: Atraumatic. Nose: No congestion/rhinnorhea. Mouth/Throat: Mucous membranes are moist.   Neck: No stridor. Painless ROM.  Cardiovascular: Normal rate, regular rhythm. Grossly normal heart sounds.  Good peripheral circulation. Respiratory: Normal respiratory effort.  No retractions. Lungs CTAB. Gastrointestinal: Soft and nontender. No distention. No abdominal bruits. No CVA tenderness. Genitourinary:  Musculoskeletal: No lower extremity tenderness nor edema.  No joint effusions. Neurologic:  Normal speech and language. No gross focal neurologic deficits are appreciated. No facial droop Skin:  Skin is warm, dry and intact. No rash noted. Psychiatric: Mood and affect are normal. Speech and behavior  are normal.  ____________________________________________   LABS (all labs ordered are listed, but only abnormal results are displayed)  Results for orders placed or performed during the hospital encounter of 03/14/21 (from the past 24 hour(s))  Dilantin (phenytoin) Level (if patient is taking this medication)     Status: Abnormal   Collection Time: 03/14/21  7:48 AM  Result Value Ref Range   Phenytoin Lvl 5.4 (L) 10.0 - 20.0 ug/mL  CBC - if new onset seizures     Status: Abnormal   Collection Time: 03/14/21  7:48 AM  Result Value Ref Range   WBC 4.5 4.0 - 10.5 K/uL   RBC 3.82 (L) 4.22 - 5.81 MIL/uL   Hemoglobin 11.8 (L) 13.0 - 17.0 g/dL   HCT 78.6 (L) 75.4 - 49.2 %   MCV 95.8 80.0 - 100.0 fL   MCH 30.9 26.0 - 34.0 pg   MCHC 32.2 30.0 - 36.0 g/dL   RDW 01.0 07.1 - 21.9 %   Platelets 289 150 - 400 K/uL   nRBC 0.0 0.0 - 0.2 %  Basic metabolic panel - if new onset seizures     Status: Abnormal   Collection Time: 03/14/21  7:48 AM  Result Value Ref Range   Sodium 135 135 - 145 mmol/L   Potassium 3.7 3.5 - 5.1 mmol/L   Chloride 104 98 - 111 mmol/L   CO2 27 22 - 32 mmol/L   Glucose, Bld 128 (H) 70 - 99 mg/dL   BUN 8 6 - 20 mg/dL   Creatinine, Ser 7.58 0.61 - 1.24 mg/dL   Calcium 8.6 (L) 8.9 - 10.3 mg/dL   GFR, Estimated >83 >25 mL/min   Anion gap 4 (L) 5 - 15   ____________________________________________  EKG____________________________________________  RADIOLOGY  I personally reviewed all radiographic images ordered to evaluate for the above acute complaints and reviewed radiology reports and findings.  These findings were personally discussed with the patient.  Please see medical record for radiology report.  ____________________________________________   PROCEDURES  Procedure(s) performed:  Procedures    Critical Care performed: no ____________________________________________   INITIAL IMPRESSION / ASSESSMENT AND PLAN / ED COURSE  Pertinent labs & imaging  results that were available during my care of the patient were reviewed by me and considered in my medical decision making (see chart for details).   DDX: seizure, status, noncompliance, electrolyte abn  Martin Cooper is a 35 y.o. who presents to the ED with presentation as described above setting patient known to have seizure disorder with frequent ER visits for same with Dilantin level frequently being subtherapeutic.  Again today it is low.  He states his had access to his medication and has been using them has a history of noncompliance.  We will give loading dose of phenytoin p.o.  He is not having any symptoms of seizure or clinical features to suggest status epilepticus.  I do not feel that repeat neuroimaging clinically indicated.  Discussed the case in consultation with neurology who has recommended increasing his Dilantin at home to 330 mg nightly as well as increasing Keppra to 1 g  twice daily.  Clinical Course as of 03/14/21 1139  Tue Mar 14, 2021  1135 Patient remains well nontoxic-appearing.  Observed here in the ER for 4 hours with no seizure-like activity.  Do believe he is stable and appropriate for outpatient follow-up. [PR]    Clinical Course User Index [PR] Willy Eddy, MD    The patient was evaluated in Emergency Department today for the symptoms described in the history of present illness. He/she was evaluated in the context of the global COVID-19 pandemic, which necessitated consideration that the patient might be at risk for infection with the SARS-CoV-2 virus that causes COVID-19. Institutional protocols and algorithms that pertain to the evaluation of patients at risk for COVID-19 are in a state of rapid change based on information released by regulatory bodies including the CDC and federal and state organizations. These policies and algorithms were followed during the patient's care in the ED.  As part of my medical decision making, I reviewed the following data  within the electronic MEDICAL RECORD NUMBER Nursing notes reviewed and incorporated, Labs reviewed, notes from prior ED visits and [place] Controlled Substance Database   ____________________________________________   FINAL CLINICAL IMPRESSION(S) / ED DIAGNOSES  Final diagnoses:  Seizure-like activity (HCC)      NEW MEDICATIONS STARTED DURING THIS VISIT:  New Prescriptions   PHENYTOIN (DILANTIN) 30 MG ER CAPSULE    Take 1 capsule (30 mg total) by mouth at bedtime.     Note:  This document was prepared using Dragon voice recognition software and may include unintentional dictation errors.    Willy Eddy, MD 03/14/21 1139

## 2021-03-16 ENCOUNTER — Other Ambulatory Visit: Payer: Self-pay

## 2021-03-21 ENCOUNTER — Other Ambulatory Visit: Payer: Self-pay

## 2021-03-29 ENCOUNTER — Other Ambulatory Visit: Payer: Self-pay

## 2021-03-29 ENCOUNTER — Emergency Department: Payer: Medicaid Other

## 2021-03-29 ENCOUNTER — Emergency Department
Admission: EM | Admit: 2021-03-29 | Discharge: 2021-03-29 | Disposition: A | Discharge status: Home / Self Care | Payer: Medicaid Other | Attending: Emergency Medicine | Admitting: Emergency Medicine

## 2021-03-29 DIAGNOSIS — R0789 Other chest pain: Secondary | ICD-10-CM | POA: Diagnosis not present

## 2021-03-29 DIAGNOSIS — Z7952 Long term (current) use of systemic steroids: Secondary | ICD-10-CM | POA: Diagnosis not present

## 2021-03-29 DIAGNOSIS — R079 Chest pain, unspecified: Secondary | ICD-10-CM

## 2021-03-29 DIAGNOSIS — J45909 Unspecified asthma, uncomplicated: Secondary | ICD-10-CM | POA: Insufficient documentation

## 2021-03-29 DIAGNOSIS — F17210 Nicotine dependence, cigarettes, uncomplicated: Secondary | ICD-10-CM | POA: Insufficient documentation

## 2021-03-29 DIAGNOSIS — R001 Bradycardia, unspecified: Secondary | ICD-10-CM | POA: Insufficient documentation

## 2021-03-29 DIAGNOSIS — R1032 Left lower quadrant pain: Secondary | ICD-10-CM | POA: Diagnosis present

## 2021-03-29 DIAGNOSIS — R1013 Epigastric pain: Secondary | ICD-10-CM

## 2021-03-29 LAB — TROPONIN I (HIGH SENSITIVITY): Troponin I (High Sensitivity): 3 ng/L (ref <18)

## 2021-03-29 LAB — HEPATIC FUNCTION PANEL
ALT: 25 U/L (ref 0–44)
AST: 36 U/L (ref 15–41)
Albumin: 3.9 g/dL (ref 3.5–5.0)
Alkaline Phosphatase: 74 U/L (ref 38–126)
Bilirubin, Direct: <0.1 mg/dL (ref 0.0–0.2)
Total Bilirubin: 0.5 mg/dL (ref 0.3–1.2)
Total Protein: 7 g/dL (ref 6.5–8.1)

## 2021-03-29 LAB — CBC
HCT: 39.4 % (ref 39.0–52.0)
Hemoglobin: 12.9 g/dL — ABNORMAL LOW (ref 13.0–17.0)
MCH: 30.9 pg (ref 26.0–34.0)
MCHC: 32.7 g/dL (ref 30.0–36.0)
MCV: 94.5 fL (ref 80.0–100.0)
Platelets: 304 10*3/uL (ref 150–400)
RBC: 4.17 MIL/uL — ABNORMAL LOW (ref 4.22–5.81)
RDW: 12.3 % (ref 11.5–15.5)
WBC: 4.3 10*3/uL (ref 4.0–10.5)
nRBC: 0 % (ref 0.0–0.2)

## 2021-03-29 LAB — BASIC METABOLIC PANEL
Anion gap: 5 (ref 5–15)
BUN: 10 mg/dL (ref 6–20)
CO2: 27 mmol/L (ref 22–32)
Calcium: 8.9 mg/dL (ref 8.9–10.3)
Chloride: 107 mmol/L (ref 98–111)
Creatinine, Ser: 1.01 mg/dL (ref 0.61–1.24)
GFR, Estimated: >60 mL/min (ref >60)
Glucose, Bld: 105 mg/dL — ABNORMAL HIGH (ref 70–99)
Potassium: 4.3 mmol/L (ref 3.5–5.1)
Sodium: 139 mmol/L (ref 135–145)

## 2021-03-29 LAB — LIPASE, BLOOD: Lipase: 67 U/L — ABNORMAL HIGH (ref 11–51)

## 2021-03-29 MED ORDER — PANTOPRAZOLE SODIUM 40 MG PO TBEC: 40.0000 mg | DELAYED_RELEASE_TABLET | Freq: Every day | ORAL | 0 refills | Status: DC

## 2021-03-29 NOTE — ED Provider Notes (Signed)
[facility] Emergency Department Provider Note  ____________________________________________   Event Date/Time   First MD Initiated Contact with Patient 03/29/21 1232     (approximate)  I have reviewed the triage vital signs and the nursing notes.   HISTORY  Chief Complaint Chest Pain   HPI Martin Cooper is a 35 y.o. male with a past medical history of asthma and seizure disorder who presents for assessment of some epigastric and left lower quadrant abdominal pain rating up towards the sternum that started last night.  Patient states he felt particularly severe this morning but is then subsided and is currently chest pain and abdominal pain-free.  He denies any lower abdominal pain, upper chest pain, cough, fevers, shortness of breath, back pain, headache, earache, sore throat, nausea, vomiting, diarrhea, burning with urination, rash or extremity pain.  No recent falls or injuries.  He denies any recent EtOH, illicit drug use or NSAID use.  No prior similar episodes.  He has no history of ulcers or gastritis.  Pain was not precipitated by food and patient states he is currently hungry.  No other acute concerns at this time.         Past Medical History:  Diagnosis Date   Asthma    Seizures ([facility])     There are no problems to display for this patient.   History reviewed. No pertinent surgical history.  Prior to Admission medications   Medication Sig Start Date End Date Taking? Authorizing Provider  pantoprazole (PROTONIX) 40 MG tablet Take 1 tablet (40 mg total) by mouth daily for 7 days. 03/29/21 04/05/21 Yes Lucrezia Starch, MD  albuterol (PROVENTIL HFA;VENTOLIN HFA) 108 (90 Base) MCG/ACT inhaler Inhale 2 puffs into the lungs every 4 (four) hours as needed for wheezing or shortness of breath. 12/30/16   Cuthriell, Charline Bills, PA-C  brompheniramine-pseudoephedrine-DM 30-2-10 MG/5ML syrup Take 10 mLs by mouth 4 (four) times daily as needed. Patient not  taking: No sig reported 12/30/16   Cuthriell, Charline Bills, PA-C  fluticasone (FLONASE) 50 MCG/ACT nasal spray Place 1 spray into both nostrils 2 (two) times daily. 12/30/16   Cuthriell, Charline Bills, PA-C  gabapentin (NEURONTIN) 300 MG capsule Take 1 capsule by mouth daily.    [provider]  guaiFENesin-codeine 100-10 MG/5ML syrup Take 5 mLs by mouth every 6 (six) hours as needed for cough. 02/06/21   Harvest Dark, MD  levETIRAcetam (KEPPRA) 500 MG tablet Take 2 tablets (1,000 mg total) by mouth 2 (two) times daily. 03/14/21 05/13/21  Merlyn Lot, MD  ondansetron (ZOFRAN ODT) 4 MG disintegrating tablet Take 1 tablet (4 mg total) by mouth every 8 (eight) hours as needed for nausea or vomiting. 02/06/21   Harvest Dark, MD  phenytoin (DILANTIN) 30 MG ER capsule Take 1 capsule (30 mg total) by mouth at bedtime. 03/14/21 03/14/22  Merlyn Lot, MD  phenytoin (DILANTIN) 300 MG ER capsule Take 1 capsule (300 mg total) by mouth at bedtime. 03/14/21 05/13/21  Merlyn Lot, MD    Allergies Patient has no known allergies.  No family history on file.  Social History Social History   Tobacco Use   Smoking status: Every Day    Packs/day: 0.50    Types: Cigarettes   Smokeless tobacco: Never  Vaping Use   Vaping Use: Never used  Substance Use Topics   Alcohol use: No   Drug use: No    Review of Systems  Review of Systems  Constitutional:  Negative for chills  and fever.  HENT:  Negative for sore throat.   Eyes:  Negative for pain.  Respiratory:  Negative for cough and stridor.   Cardiovascular:  Positive for chest pain.  Gastrointestinal:  Positive for abdominal pain. Negative for vomiting.  Skin:  Negative for rash.  Neurological:  Negative for seizures, loss of consciousness and headaches.  Psychiatric/Behavioral:  Negative for suicidal ideas.   All other systems reviewed and are negative.    ____________________________________________   PHYSICAL  EXAM:  VITAL SIGNS: ED Triage Vitals  Enc Vitals Group     BP 03/29/21 1210 121/78     Pulse Rate 03/29/21 1210 63     Resp 03/29/21 1210 16     Temp 03/29/21 1210 98.4 F (36.9 C)     Temp Source 03/29/21 1210 Oral     SpO2 03/29/21 1210 100 %     Weight --      Height --      Head Circumference --      Peak Flow --      Pain Score 03/29/21 1206 10     Pain Loc --      Pain Edu? --      Excl. in GC? --    Vitals:   03/29/21 1210  BP: 121/78  Pulse: 63  Resp: 16  Temp: 98.4 F (36.9 C)  SpO2: 100%   Physical Exam Vitals and nursing note reviewed.  Constitutional:      General: He is not in acute distress.    Appearance: He is well-developed.  HENT:     Head: Normocephalic and atraumatic.     Right Ear: External ear normal.     Left Ear: External ear normal.     Nose: Nose normal.  Eyes:     Conjunctiva/sclera: Conjunctivae normal.  Cardiovascular:     Rate and Rhythm: Normal rate and regular rhythm.     Heart sounds: No murmur heard. Pulmonary:     Effort: Pulmonary effort is normal. No respiratory distress.     Breath sounds: Normal breath sounds.  Abdominal:     Palpations: Abdomen is soft.     Tenderness: There is no abdominal tenderness.  Musculoskeletal:        General: No swelling.     Cervical back: Neck supple.  Skin:    General: Skin is warm and dry.     Capillary Refill: Capillary refill takes less than 2 seconds.  Neurological:     Mental Status: He is alert.  Psychiatric:        Mood and Affect: Mood normal.     ____________________________________________   LABS (all labs ordered are listed, but only abnormal results are displayed)  Labs Reviewed  BASIC METABOLIC PANEL - Abnormal; Notable for the following components:      Result Value   Glucose, Bld 105 (*)    All other components within normal limits  CBC - Abnormal; Notable for the following components:   RBC 4.17 (*)    Hemoglobin 12.9 (*)    All other components within  normal limits  LIPASE, BLOOD - Abnormal; Notable for the following components:   Lipase 67 (*)    All other components within normal limits  HEPATIC FUNCTION PANEL  TROPONIN I (HIGH SENSITIVITY)   ____________________________________________  EKG  ECG remarkable for sinus bradycardia with a ventricular rate of 59 and some J-point elevation in anterior lateral and inferior leads without other clear evidence of acute ischemia or significant arrhythmia.  ____________________________________________  RADIOLOGY  ED MD interpretation: Chest x-ray shows no focal consolidation, effusion, edema, free air under the diaphragm, pneumothorax or other acute process.  Official radiology report(s): DG Chest 2 View  Result Date: 03/29/2021 CLINICAL DATA:  Chest pain EXAM: CHEST - 2 VIEW COMPARISON:  February 06, 2021 FINDINGS: The heart size and mediastinal contours are within normal limits. No focal consolidation. No pleural effusion. No pneumothorax. The visualized skeletal structures are unremarkable. IMPRESSION: No active cardiopulmonary disease. Electronically Signed   By: Dahlia Bailiff M.D.   On: 03/29/2021 12:41    ____________________________________________   PROCEDURES  Procedure(s) performed (including Critical Care):  Procedures   ____________________________________________   INITIAL IMPRESSION / ASSESSMENT AND PLAN / ED COURSE      Patient presents with above-stated history exam for assessment of some lower chest upper abdominal pain that started last night and is since resolved.  No clear alleviating or aggravating factors.  On arrival he is afebrile and hemodynamically stable.  He states he is currently symptom-free and is hungry.  Primary differential considerations include possible reflux, esophageal spasm, GERD and peptic ulcer disease.  ECG remarkable for sinus bradycardia with a ventricular rate of 59 and some J-point elevation in anterior lateral and inferior leads  without other clear evidence of acute ischemia or significant arrhythmia.  Given nonelevated troponin and overall history of a low suspicion for ACS, myocarditis or pericarditis at this time.  Chest x-ray shows no focal consolidation, effusion, edema, free air under the diaphragm, pneumothorax or other acute process.  He has no fever, cough or leukocytosis to suggest a bacterial pneumonia or other respiratory symptoms to suggest a viral bronchitis at this time.  BMP without significant electrolyte or metabolic derangements.  CBC is otherwise unremarkable.  Low suspicion for PE as patient is PERC negative and overall presentation is not consistent with dissection at this time.  Lipase not consistent with acute pancreatitis.  Hepatic function panel without evidence of acute hepatitis or cholestatic process.  Given resolution of symptoms I do not believe he requires medications at this time although we will start patient on a short course of Protonix to see if this helps prevent possible recurrence and advised him to follow-up with PCP.  He is amenable to this.  Discharged in stable condition.  Strict return precautions advised and discussed.        ____________________________________________   FINAL CLINICAL IMPRESSION(S) / ED DIAGNOSES  Final diagnoses:  Chest pain, unspecified type  Epigastric pain    Medications - No data to display   ED Discharge Orders          Ordered    pantoprazole (PROTONIX) 40 MG tablet  Daily        03/29/21 1345             Note:  This document was prepared using Dragon voice recognition software and may include unintentional dictation errors.    Lucrezia Starch, MD 03/29/21 1346

## 2021-03-29 NOTE — ED Triage Notes (Signed)
Pt comes with c/o mid sternal CP that started this morning. Pt states some SOB and denies any N/V/D.

## 2021-03-29 NOTE — ED Notes (Signed)
Pt to ED for sharp pain that has already resolved. Pt felt sharp pain "from stomach into chest" that started yesterday and worsened this morning.  Pt lying in bed, denies pain at this time.

## 2021-03-29 NOTE — ED Provider Notes (Signed)
Emergency Medicine Provider Triage Evaluation Note  Martin Cooper , a 35 y.o. male  was evaluated in triage.  Pt complains of epigastric/chest pain that started this morning. No cardiac history.  Review of Systems  Positive: Chest pain/epigastric pain Negative: Fever, vomiting, diarrhea  Physical Exam  There were no vitals taken for this visit. Gen:   Awake, no distress   Resp:  Normal effort  MSK:   Moves extremities without difficulty  Other:    Medical Decision Making   Cardiac protocol initiated.  Medically screening exam initiated at 12:05 PM.  Appropriate orders placed.  Martin Cooper was informed that the remainder of the evaluation will be completed by another provider, this initial triage assessment does not replace that evaluation, and the importance of remaining in the ED until their evaluation is complete.   Martin Pester, FNP 03/29/21 1207    Martin Bucy, MD 03/29/21 1317

## 2021-03-29 NOTE — ED Triage Notes (Signed)
Pt comes into the ED via EMS from home with c/o epigastric pain radiating into the chest this morning.   127/76 100%RA HR61 SR

## 2021-04-01 ENCOUNTER — Emergency Department
Admission: EM | Admit: 2021-04-01 | Discharge: 2021-04-02 | Disposition: A | Discharge status: Home / Self Care | Payer: Medicaid Other | Attending: Emergency Medicine | Admitting: Emergency Medicine

## 2021-04-01 ENCOUNTER — Encounter: Payer: Self-pay | Admitting: Emergency Medicine

## 2021-04-01 ENCOUNTER — Other Ambulatory Visit: Payer: Self-pay

## 2021-04-01 DIAGNOSIS — J45909 Unspecified asthma, uncomplicated: Secondary | ICD-10-CM | POA: Insufficient documentation

## 2021-04-01 DIAGNOSIS — F17210 Nicotine dependence, cigarettes, uncomplicated: Secondary | ICD-10-CM | POA: Diagnosis not present

## 2021-04-01 DIAGNOSIS — F1099 Alcohol use, unspecified with unspecified alcohol-induced disorder: Secondary | ICD-10-CM | POA: Diagnosis not present

## 2021-04-01 DIAGNOSIS — R569 Unspecified convulsions: Secondary | ICD-10-CM | POA: Diagnosis present

## 2021-04-01 DIAGNOSIS — W010XXA Fall on same level from slipping, tripping and stumbling without subsequent striking against object, initial encounter: Secondary | ICD-10-CM | POA: Insufficient documentation

## 2021-04-01 DIAGNOSIS — Y904 Blood alcohol level of 80-99 mg/100 ml: Secondary | ICD-10-CM | POA: Insufficient documentation

## 2021-04-01 DIAGNOSIS — M25571 Pain in right ankle and joints of right foot: Secondary | ICD-10-CM | POA: Diagnosis not present

## 2021-04-01 DIAGNOSIS — G40909 Epilepsy, unspecified, not intractable, without status epilepticus: Secondary | ICD-10-CM | POA: Diagnosis not present

## 2021-04-01 NOTE — ED Triage Notes (Signed)
Pt reports compliance with Keppra and Dilantin. Pt's story in accordance with first nurse note. Pt reports during argument, he tripped and fell and cannot walk due to pain in R ankle. Full ROM on assessment, states he normally walks. Endorses ETOH use tonight

## 2021-04-01 NOTE — ED Triage Notes (Signed)
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  Pt has some confusion, but was able to recount what happened. Pt was ambulatory with EMS.  134/90 p-68 100% RA 98.7 temp with EMS  CBG 142

## 2021-04-02 ENCOUNTER — Emergency Department: Payer: Medicaid Other

## 2021-04-02 LAB — CBC
HCT: 36.8 % — ABNORMAL LOW (ref 39.0–52.0)
Hemoglobin: 12.3 g/dL — ABNORMAL LOW (ref 13.0–17.0)
MCH: 31.4 pg (ref 26.0–34.0)
MCHC: 33.4 g/dL (ref 30.0–36.0)
MCV: 93.9 fL (ref 80.0–100.0)
Platelets: 260 10*3/uL (ref 150–400)
RBC: 3.92 MIL/uL — ABNORMAL LOW (ref 4.22–5.81)
RDW: 12.4 % (ref 11.5–15.5)
WBC: 7.1 10*3/uL (ref 4.0–10.5)
nRBC: 0 % (ref 0.0–0.2)

## 2021-04-02 LAB — BASIC METABOLIC PANEL
Anion gap: 5 (ref 5–15)
BUN: 7 mg/dL (ref 6–20)
CO2: 30 mmol/L (ref 22–32)
Calcium: 9.2 mg/dL (ref 8.9–10.3)
Chloride: 106 mmol/L (ref 98–111)
Creatinine, Ser: 0.89 mg/dL (ref 0.61–1.24)
GFR, Estimated: >60 mL/min (ref >60)
Glucose, Bld: 86 mg/dL (ref 70–99)
Potassium: 4.1 mmol/L (ref 3.5–5.1)
Sodium: 141 mmol/L (ref 135–145)

## 2021-04-02 LAB — ETHANOL: Alcohol, Ethyl (B): 94 mg/dL — ABNORMAL HIGH (ref <10)

## 2021-04-02 LAB — PHENYTOIN LEVEL, TOTAL: Phenytoin Lvl: <2.5 ug/mL — ABNORMAL LOW (ref 10.0–20.0)

## 2021-04-02 NOTE — ED Provider Notes (Signed)
[facility] Emergency Department Provider Note  ____________________________________________  Time seen: Approximately 2:59 AM  I have reviewed the triage vital signs and the nursing notes.   HISTORY  Chief Complaint No chief complaint on file.   HPI Martin Cooper is a 35 y.o. male with a history of seizures on Keppra and Dilantin who presents after having seizure.  Patient endorses compliance with his antiepileptic medications.  Patient reports drinking alcohol tonight.  Had an episode of generalized tonic-clonic seizure lasting about 15 minutes per family.  Patient reports that he usually gets seizures when he drinks or gets upset.  Patient is complaining of right ankle pain.  Reports that he tripped and fell earlier today.  Reports that the pain is worse with movement of the ankle.  During the seizure patient did not collapse and no head injury.  Denies any other pains.  No cough or fever, no headache, no chest pain or shortness of breath, no abdominal pain  Past Medical History:  Diagnosis Date   Asthma    Seizures (HCC)    History reviewed. No pertinent surgical history.  Prior to Admission medications   Medication Sig Start Date End Date Taking? Authorizing Provider  albuterol (PROVENTIL HFA;VENTOLIN HFA) 108 (90 Base) MCG/ACT inhaler Inhale 2 puffs into the lungs every 4 (four) hours as needed for wheezing or shortness of breath. 12/30/16   Cuthriell, Delorise Royals, PA-C  brompheniramine-pseudoephedrine-DM 30-2-10 MG/5ML syrup Take 10 mLs by mouth 4 (four) times daily as needed. Patient not taking: No sig reported 12/30/16   Cuthriell, Delorise Royals, PA-C  fluticasone (FLONASE) 50 MCG/ACT nasal spray Place 1 spray into both nostrils 2 (two) times daily. 12/30/16   Cuthriell, Delorise Royals, PA-C  gabapentin (NEURONTIN) 300 MG capsule Take 1 capsule by mouth daily.    [provider]  guaiFENesin-codeine 100-10 MG/5ML syrup Take 5 mLs by mouth every 6  (six) hours as needed for cough. 02/06/21   Minna Antis, MD  levETIRAcetam (KEPPRA) 500 MG tablet Take 2 tablets (1,000 mg total) by mouth 2 (two) times daily. 03/14/21 05/13/21  Willy Eddy, MD  ondansetron (ZOFRAN ODT) 4 MG disintegrating tablet Take 1 tablet (4 mg total) by mouth every 8 (eight) hours as needed for nausea or vomiting. 02/06/21   Minna Antis, MD  pantoprazole (PROTONIX) 40 MG tablet Take 1 tablet (40 mg total) by mouth daily for 7 days. 03/29/21 04/05/21  Gilles Chiquito, MD  phenytoin (DILANTIN) 30 MG ER capsule Take 1 capsule (30 mg total) by mouth at bedtime. 03/14/21 03/14/22  Willy Eddy, MD  phenytoin (DILANTIN) 300 MG ER capsule Take 1 capsule (300 mg total) by mouth at bedtime. 03/14/21 05/13/21  Willy Eddy, MD    Allergies Patient has no known allergies.  No family history on file.  Social History Social History   Tobacco Use   Smoking status: Every Day    Packs/day: 0.50    Types: Cigarettes   Smokeless tobacco: Never  Vaping Use   Vaping Use: Never used  Substance Use Topics   Alcohol use: Yes    Comment: occasional   Drug use: No    Review of Systems  Constitutional: Negative for fever. Eyes: Negative for visual changes. ENT: Negative for sore throat. Neck: No neck pain  Cardiovascular: Negative for chest pain. Respiratory: Negative for shortness of breath. Gastrointestinal: Negative for abdominal pain, vomiting or diarrhea. Genitourinary: Negative for dysuria. Musculoskeletal: Negative for back pain. + R ankle pain Skin:  Negative for rash. Neurological: Negative for headaches, weakness or numbness. + seizure Psych: No SI or HI  ____________________________________________   PHYSICAL EXAM:  VITAL SIGNS: Vitals:   04/01/21 2352 04/02/21 0301  BP: 118/86   Pulse: (!) 110 61  Resp: 18   Temp: 98.4 F (36.9 C)   SpO2: 97%      Constitutional: Alert and oriented. Well appearing and in no apparent  distress. HEENT:      Head: Normocephalic and atraumatic.         Eyes: Conjunctivae are normal. Sclera is non-icteric.       Mouth/Throat: Mucous membranes are moist.       Neck: Supple with no signs of meningismus. Cardiovascular: Regular rate and rhythm. No murmurs, gallops, or rubs. 2+ symmetrical distal pulses are present in all extremities. No JVD. Respiratory: Normal respiratory effort. Lungs are clear to auscultation bilaterally.  Gastrointestinal: Soft, non tender, and non distended with positive bowel sounds. No rebound or guarding. Genitourinary: No CVA tenderness. Musculoskeletal:  No edema, cyanosis, or erythema of extremities. Neurologic: Normal speech and language. Face is symmetric. Moving all extremities. No gross focal neurologic deficits are appreciated. Skin: Skin is warm, dry and intact. No rash noted. Psychiatric: Mood and affect are normal. Speech and behavior are normal.  ____________________________________________   LABS (all labs ordered are listed, but only abnormal results are displayed)  Labs Reviewed  ETHANOL - Abnormal; Notable for the following components:      Result Value   Alcohol, Ethyl (B) 94 (*)    All other components within normal limits  CBC - Abnormal; Notable for the following components:   RBC 3.92 (*)    Hemoglobin 12.3 (*)    HCT 36.8 (*)    All other components within normal limits  PHENYTOIN LEVEL, TOTAL - Abnormal; Notable for the following components:   Phenytoin Lvl <2.5 (*)    All other components within normal limits  BASIC METABOLIC PANEL  LEVETIRACETAM LEVEL  CBG MONITORING, ED   ____________________________________________  EKG  none  ____________________________________________  RADIOLOGY  I have personally reviewed the images performed during this visit and I agree with the Radiologist's read.   Interpretation by Radiologist:  DG Ankle Complete Right  Result Date: 04/02/2021 CLINICAL DATA:  Recent seizure  activity with ankle pain, initial encounter EXAM: RIGHT ANKLE - COMPLETE 3+ VIEW COMPARISON:  03/03/2021 FINDINGS: No acute fracture or dislocation is noted. Small well corticated bony density is noted adjacent to the talonavicular joint stable from the prior exam consistent with prior trauma and nonunion. No acute soft tissue abnormality is noted. IMPRESSION: No acute abnormality noted. Electronically Signed   By: Alcide Clever M.D.   On: 04/02/2021 00:41     ____________________________________________   PROCEDURES  Procedure(s) performed: None Procedures   Critical Care performed:  None ____________________________________________   INITIAL IMPRESSION / ASSESSMENT AND PLAN / ED COURSE   35 y.o. male with a history of seizures on Keppra and Dilantin who presents after having seizure. + EtOH tonight.  Patient is back to baseline, ambulating with no difficulties, neurologically intact.  Complaining of right ankle pain but no other trauma.  X-ray of the ankle was negative for any fractures.  Patient does endorse compliance with his antiepileptic medication however his phenytoin level here is undetectable.  Keppra level is pending.  I did discuss with him the importance of taking his medications and especially not drinking alcohol which can lower his seizure threshold.  We talked about  the importance of maintaining levels of these medications and his blood.  Recommended follow-up with his primary care doctor.  Patient was monitored for 4 hours in the emergency room and no further episodes of seizure and remained stable.  Will be discharged to the care of family we discussed seizure precautions.      _____________________________________________ Please note:  Patient was evaluated in Emergency Department today for the symptoms described in the history of present illness. Patient was evaluated in the context of the global COVID-19 pandemic, which necessitated consideration that the patient might be  at risk for infection with the SARS-CoV-2 virus that causes COVID-19. Institutional protocols and algorithms that pertain to the evaluation of patients at risk for COVID-19 are in a state of rapid change based on information released by regulatory bodies including the CDC and federal and state organizations. These policies and algorithms were followed during the patient's care in the ED.  Some ED evaluations and interventions may be delayed as a result of limited staffing during the pandemic.   [place] Controlled Substance Database was reviewed by me. ____________________________________________   FINAL CLINICAL IMPRESSION(S) / ED DIAGNOSES   Final diagnoses:  Seizure (HCC)      NEW MEDICATIONS STARTED DURING THIS VISIT:  ED Discharge Orders     None        Note:  This document was prepared using Dragon voice recognition software and may include unintentional dictation errors.    Nita Sickle, MD 04/02/21 [phone]

## 2021-04-02 NOTE — ED Notes (Signed)
Patient verbalizes understanding of discharge instructions. Opportunity for questioning and answers were provided. Armband removed by staff, pt discharged from ED. Ambulated out to lobby  

## 2021-04-03 LAB — LEVETIRACETAM LEVEL: Levetiracetam Lvl: 12.7 ug/mL (ref 10.0–40.0)

## 2021-04-22 ENCOUNTER — Emergency Department
Admission: EM | Admit: 2021-04-22 | Discharge: 2021-04-22 | Disposition: A | Discharge status: Home / Self Care | Payer: Medicaid Other | Attending: Student in an Organized Health Care Education/Training Program | Admitting: Student in an Organized Health Care Education/Training Program

## 2021-04-22 ENCOUNTER — Encounter: Payer: Self-pay | Admitting: Emergency Medicine

## 2021-04-22 ENCOUNTER — Other Ambulatory Visit: Payer: Self-pay

## 2021-04-22 DIAGNOSIS — R569 Unspecified convulsions: Secondary | ICD-10-CM | POA: Diagnosis not present

## 2021-04-22 DIAGNOSIS — Z5321 Procedure and treatment not carried out due to patient leaving prior to being seen by health care provider: Secondary | ICD-10-CM | POA: Insufficient documentation

## 2021-04-22 DIAGNOSIS — R7309 Other abnormal glucose: Secondary | ICD-10-CM | POA: Insufficient documentation

## 2021-04-22 LAB — PHENYTOIN LEVEL, TOTAL: Phenytoin Lvl: <2.5 ug/mL — ABNORMAL LOW (ref 10.0–20.0)

## 2021-04-22 LAB — CBG MONITORING, ED: Glucose-Capillary: 81 mg/dL (ref 70–99)

## 2021-04-22 NOTE — ED Notes (Signed)
Pt on phone in lobby in no acute distress.

## 2021-04-22 NOTE — ED Triage Notes (Signed)
Pt BIB EMS post seizure tonight, pt reports he does not remember anything, reports no missed doses of seizure meds, per girlfriend on phone seizure 1 min around 2000-2030, denies hitting head, seizure took place on bed

## 2021-04-22 NOTE — ED Notes (Signed)
Pt states he is leaving and does not want to stay. Pt ambulatory and appears in no acute distress.

## 2021-04-26 ENCOUNTER — Encounter: Payer: Self-pay | Admitting: Medical Oncology

## 2021-04-26 ENCOUNTER — Emergency Department
Admission: EM | Admit: 2021-04-26 | Discharge: 2021-04-26 | Disposition: A | Discharge status: Home / Self Care | Payer: Medicaid Other | Attending: Emergency Medicine | Admitting: Emergency Medicine

## 2021-04-26 DIAGNOSIS — L731 Pseudofolliculitis barbae: Secondary | ICD-10-CM

## 2021-04-26 DIAGNOSIS — R220 Localized swelling, mass and lump, head: Secondary | ICD-10-CM | POA: Diagnosis present

## 2021-04-26 NOTE — ED Triage Notes (Signed)
Pt states that he was sent here by his PCP for the ED to put gauze to the bump on the rt side of his face. Pt reports "bump". Pimple noted to area. Pt in NAD.

## 2021-04-26 NOTE — Discharge Instructions (Addendum)
Apply warm compresses
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
## 2021-05-04 ENCOUNTER — Emergency Department: Payer: Medicaid Other

## 2021-05-04 ENCOUNTER — Emergency Department
Admission: EM | Admit: 2021-05-04 | Discharge: 2021-05-04 | Disposition: A | Discharge status: Home / Self Care | Payer: Medicaid Other | Attending: Emergency Medicine | Admitting: Emergency Medicine

## 2021-05-04 ENCOUNTER — Other Ambulatory Visit: Payer: Self-pay

## 2021-05-04 ENCOUNTER — Encounter: Payer: Self-pay | Admitting: Emergency Medicine

## 2021-05-04 DIAGNOSIS — S6991XA Unspecified injury of right wrist, hand and finger(s), initial encounter: Secondary | ICD-10-CM | POA: Diagnosis present

## 2021-05-04 DIAGNOSIS — S60221A Contusion of right hand, initial encounter: Secondary | ICD-10-CM | POA: Insufficient documentation
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
## 2021-05-04 NOTE — Discharge Instructions (Addendum)
Your exam and XR are normal at this time. There is no evidence of a fracture at this time. You do not have problems with your hardware.
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
## 2021-05-07 ENCOUNTER — Emergency Department
Admission: EM | Admit: 2021-05-07 | Discharge: 2021-05-07 | Disposition: A | Discharge status: Home / Self Care | Payer: Medicaid Other | Attending: Emergency Medicine | Admitting: Emergency Medicine

## 2021-05-07 ENCOUNTER — Other Ambulatory Visit: Payer: Self-pay

## 2021-05-07 ENCOUNTER — Emergency Department: Payer: Medicaid Other

## 2021-05-07 DIAGNOSIS — G44209 Tension-type headache, unspecified, not intractable: Secondary | ICD-10-CM

## 2021-05-07 DIAGNOSIS — R519 Headache, unspecified: Secondary | ICD-10-CM | POA: Diagnosis present

## 2021-05-07 LAB — CBC
HCT: 39.1 % (ref 39.0–52.0)
Hemoglobin: 13 g/dL (ref 13.0–17.0)
MCH: 30.9 pg (ref 26.0–34.0)
MCHC: 33.2 g/dL (ref 30.0–36.0)
MCV: 92.9 fL (ref 80.0–100.0)
Platelets: 265 10*3/uL (ref 150–400)
RBC: 4.21 MIL/uL — ABNORMAL LOW (ref 4.22–5.81)
RDW: 12.1 % (ref 11.5–15.5)
WBC: 5.2 10*3/uL (ref 4.0–10.5)
nRBC: 0 % (ref 0.0–0.2)

## 2021-05-07 LAB — BASIC METABOLIC PANEL
Anion gap: 7 (ref 5–15)
BUN: 8 mg/dL (ref 6–20)
CO2: 27 mmol/L (ref 22–32)
Calcium: 9.4 mg/dL (ref 8.9–10.3)
Chloride: 104 mmol/L (ref 98–111)
Creatinine, Ser: 0.94 mg/dL (ref 0.61–1.24)
GFR, Estimated: >60 mL/min (ref >60)
Glucose, Bld: 104 mg/dL — ABNORMAL HIGH (ref 70–99)
Potassium: 3.5 mmol/L (ref 3.5–5.1)
Sodium: 138 mmol/L (ref 135–145)

## 2021-05-07 LAB — URINALYSIS, COMPLETE (UACMP) WITH MICROSCOPIC
Bacteria, UA: NONE SEEN
Bilirubin Urine: NEGATIVE
Glucose, UA: NEGATIVE mg/dL
Hgb urine dipstick: NEGATIVE
Ketones, ur: NEGATIVE mg/dL
Leukocytes,Ua: NEGATIVE
Nitrite: NEGATIVE
Protein, ur: NEGATIVE mg/dL
Specific Gravity, Urine: 1.023 (ref 1.005–1.030)
pH: 6 (ref 5.0–8.0)

## 2021-05-07 NOTE — ED Triage Notes (Addendum)
Pt comes with c/o headache and some blurry vision. Pt states dizziness.  Pt states he has been feeling depressed. Pt denies any SI. Pt states he gets headache then has a seizure.   Mini neuro neg
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
## 2021-05-07 NOTE — Discharge Instructions (Signed)
Take Tylenol 1000 mg every 6 hours up to 3 times a day for headache.  This is okay to take with your other medications.  Drink plenty of fluids.

## 2021-05-07 NOTE — ED Notes (Signed)
D/C and reasons to return to ED discussed with pt, pt verbalized understanding. Pt ambulatory with steady gait on D/C.  

## 2021-05-18 ENCOUNTER — Other Ambulatory Visit: Payer: Self-pay

## 2021-05-18 ENCOUNTER — Encounter: Payer: Self-pay | Admitting: Emergency Medicine

## 2021-05-18 ENCOUNTER — Emergency Department
Admission: EM | Admit: 2021-05-18 | Discharge: 2021-05-18 | Disposition: A | Discharge status: Home / Self Care | Payer: Medicaid Other | Attending: Emergency Medicine | Admitting: Emergency Medicine

## 2021-05-18 DIAGNOSIS — R42 Dizziness and giddiness: Secondary | ICD-10-CM

## 2021-05-18 LAB — CBC
HCT: 40.6 % (ref 39.0–52.0)
Hemoglobin: 13.2 g/dL (ref 13.0–17.0)
MCH: 30.6 pg (ref 26.0–34.0)
MCHC: 32.5 g/dL (ref 30.0–36.0)
MCV: 94 fL (ref 80.0–100.0)
Platelets: 274 10*3/uL (ref 150–400)
RBC: 4.32 MIL/uL (ref 4.22–5.81)
RDW: 12.1 % (ref 11.5–15.5)
WBC: 5.6 10*3/uL (ref 4.0–10.5)
nRBC: 0 % (ref 0.0–0.2)

## 2021-05-18 LAB — BASIC METABOLIC PANEL
Anion gap: 4 — ABNORMAL LOW (ref 5–15)
BUN: 9 mg/dL (ref 6–20)
CO2: 31 mmol/L (ref 22–32)
Calcium: 9.2 mg/dL (ref 8.9–10.3)
Chloride: 104 mmol/L (ref 98–111)
Creatinine, Ser: 1.11 mg/dL (ref 0.61–1.24)
GFR, Estimated: >60 mL/min (ref >60)
Glucose, Bld: 100 mg/dL — ABNORMAL HIGH (ref 70–99)
Potassium: 3.8 mmol/L (ref 3.5–5.1)
Sodium: 139 mmol/L (ref 135–145)

## 2021-05-18 LAB — URINALYSIS, ROUTINE W REFLEX MICROSCOPIC
Bacteria, UA: NONE SEEN
Bilirubin Urine: NEGATIVE
Glucose, UA: NEGATIVE mg/dL
Hgb urine dipstick: NEGATIVE
Ketones, ur: NEGATIVE mg/dL
Leukocytes,Ua: NEGATIVE
Nitrite: NEGATIVE
Specific Gravity, Urine: 1.025 (ref 1.005–1.030)
pH: 6 (ref 5.0–8.0)

## 2021-05-18 MED ORDER — MECLIZINE HCL 25 MG PO TABS
25.0000 mg | ORAL_TABLET | Freq: Three times a day (TID) | ORAL | 0 refills | Status: AC | PRN
  Filled 2021-05-18 – 2021-05-19 (×2): qty 21, 7d supply, fill #0

## 2021-05-18 MED ORDER — FLUTICASONE PROPIONATE 50 MCG/ACT NA SUSP
1.0000 | Freq: Every day | NASAL | 2 refills | Status: DC
  Filled 2021-05-18: qty 16, 60d supply, fill #0

## 2021-05-18 NOTE — Discharge Instructions (Signed)
You can take 1 spray of Flonase each side once daily. You can take 50 mg of meclizine up to 3 times daily. Return to the emergency department if symptoms seem to be worsening.

## 2021-05-18 NOTE — ED Provider Notes (Signed)
[facility] Provider Note  Patient Contact: 3:57 PM (approximate)   History   Dizziness   HPI  Martin Cooper is a 36 y.o. male presents to the emergency department with dizziness that started this morning.  Patient states that he has a history of seizures but is otherwise healthy.  Denies starting any new medications.  Denies falls or mechanisms of trauma.  Denies current headache, chest pain or abdominal pain.  No seizure-like activity this morning or in the past several days.  Patient states that he does not drink as much water as he normally does and does have some mild right ear fullness.  He has not attempted any medications to help with his dizziness.      Physical Exam   Triage Vital Signs: ED Triage Vitals [05/18/21 1404]  Enc Vitals Group     BP 125/87     Pulse Rate 79     Resp 18     Temp 98.2 F (36.8 C)     Temp Source Oral     SpO2 99 %     Weight 128 lb (58.1 kg)     Height 5\' 10"  (1.778 m)     Head Circumference      Peak Flow      Pain Score 0     Pain Loc      Pain Edu?      Excl. in [place]?     Most recent vital signs: Vitals:   05/18/21 1404  BP: 125/87  Pulse: 79  Resp: 18  Temp: 98.2 F (36.8 C)  SpO2: 99%     General: Alert and in no acute distress. Eyes:  PERRL. EOMI. Head: No acute traumatic findings ENT:      Ears: Tms are effused bilaterally.       Nose: No congestion/rhinnorhea.      Mouth/Throat: Mucous membranes are moist. Neck: No stridor. No cervical spine tenderness to palpation. Hematological/Lymphatic/Immunilogical: No cervical lymphadenopathy. Cardiovascular:  Good peripheral perfusion Respiratory: Normal respiratory effort without tachypnea or retractions. Lungs CTAB. Good air entry to the bases with no decreased or absent breath sounds. Gastrointestinal: Bowel sounds 4 quadrants. Soft and nontender to palpation. No guarding or rigidity. No palpable masses. No distention. No CVA  tenderness. Musculoskeletal: Full range of motion to all extremities.  Neurologic:  No gross focal neurologic deficits are appreciated.  Skin:   No rash noted Other:   ED Results / Procedures / Treatments   Labs (all labs ordered are listed, but only abnormal results are displayed) Labs Reviewed  BASIC METABOLIC PANEL - Abnormal; Notable for the following components:      Result Value   Glucose, Bld 100 (*)    Anion gap 4 (*)    All other components within normal limits  URINALYSIS, ROUTINE W REFLEX MICROSCOPIC - Abnormal; Notable for the following components:   APPearance CLEAR (*)    Protein, ur TRACE (*)    All other components within normal limits  CBC     EKG  Normal sinus rhythm without apparent arrhythmia.  Nonspecific T wave abnormality.     PROCEDURES:  Critical Care performed: No     MEDICATIONS ORDERED IN ED: Medications - No data to display   IMPRESSION / MDM / [place] / ED COURSE  I reviewed the triage vital signs and the nursing notes.  Differential diagnosis includes, but is not limited to, medication side effect, benign positional vertigo, dehydration, vestibulitis...  Assessment and plan Dizziness:  36 year old male presents to the emergency department with dizziness that started today.  Vital signs are reassuring at triage.  On physical exam, patient was alert, active and nontoxic-appearing.  He had no neurodeficits noted on exam.  BMP was reassuring.  CBC reassuring.  No signs of UTI on urinalysis.  EKG indicated normal sinus rhythm with no apparent arrhythmia.  Patient assures me that he has been taking his Keppra and phenytoin as directed.  Patient did have signs of middle ear effusion bilaterally.  We will treat patient with meclizine and Flonase and have patient follow-up with primary care if symptoms persist.  Patient has a follow-up appointment with his neurologist return precautions were given to  return with new or worsening symptoms.   FINAL CLINICAL IMPRESSION(S) / ED DIAGNOSES   Final diagnoses:  Dizziness     Rx / DC Orders   ED Discharge Orders          Ordered    fluticasone (FLONASE) 50 MCG/ACT nasal spray  Daily        05/18/21 1603    meclizine (ANTIVERT) 50 MG tablet  3 times daily PRN        05/18/21 1603             Note:  This document was prepared using Dragon voice recognition software and may include unintentional dictation errors.   Vallarie Mare Pastos, PA-C 05/18/21 1609    Carrie Mew, MD 05/18/21 [phone]

## 2021-05-18 NOTE — ED Triage Notes (Signed)
Pt comes into the ED via POV c/o dizziness that started today.  Pt states the dizziness is making the room spin.  PT denies any congestion, SHOB, chest pain, etc.  PT ambulatory to triage and in NAD with even and unlabored respirations.

## 2021-05-19 ENCOUNTER — Other Ambulatory Visit: Payer: Self-pay

## 2021-05-22 ENCOUNTER — Other Ambulatory Visit: Payer: Self-pay

## 2021-05-27 ENCOUNTER — Emergency Department: Payer: Medicaid Other

## 2021-05-27 ENCOUNTER — Emergency Department
Admission: EM | Admit: 2021-05-27 | Discharge: 2021-05-27 | Disposition: A | Discharge status: Home / Self Care | Payer: Medicaid Other | Attending: Emergency Medicine | Admitting: Emergency Medicine

## 2021-05-27 ENCOUNTER — Other Ambulatory Visit: Payer: Self-pay

## 2021-05-27 DIAGNOSIS — J45909 Unspecified asthma, uncomplicated: Secondary | ICD-10-CM | POA: Insufficient documentation

## 2021-05-27 DIAGNOSIS — G40909 Epilepsy, unspecified, not intractable, without status epilepticus: Secondary | ICD-10-CM | POA: Insufficient documentation

## 2021-05-27 DIAGNOSIS — M25571 Pain in right ankle and joints of right foot: Secondary | ICD-10-CM | POA: Insufficient documentation

## 2021-05-27 NOTE — Discharge Instructions (Signed)
Please call the number provided to follow-up with orthopedic surgery for further evaluation and work-up.  Please use your crutches to take some weight off your foot while walking to help with discomfort.  Please use Tylenol or ibuprofen as needed for pain as written on the box.
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
## 2021-05-29 ENCOUNTER — Other Ambulatory Visit: Payer: Self-pay

## 2021-05-30 ENCOUNTER — Encounter: Payer: Self-pay | Admitting: Emergency Medicine

## 2021-05-30 ENCOUNTER — Other Ambulatory Visit: Payer: Self-pay

## 2021-05-30 ENCOUNTER — Emergency Department
Admission: EM | Admit: 2021-05-30 | Discharge: 2021-05-30 | Disposition: A | Discharge status: Home / Self Care | Payer: Medicaid Other | Attending: Emergency Medicine | Admitting: Emergency Medicine

## 2021-05-30 DIAGNOSIS — X58XXXD Exposure to other specified factors, subsequent encounter: Secondary | ICD-10-CM | POA: Insufficient documentation

## 2021-05-30 DIAGNOSIS — M79671 Pain in right foot: Secondary | ICD-10-CM | POA: Diagnosis not present

## 2021-05-30 DIAGNOSIS — M7989 Other specified soft tissue disorders: Secondary | ICD-10-CM | POA: Diagnosis not present

## 2021-05-30 DIAGNOSIS — J45909 Unspecified asthma, uncomplicated: Secondary | ICD-10-CM | POA: Diagnosis not present

## 2021-05-30 DIAGNOSIS — S93401D Sprain of unspecified ligament of right ankle, subsequent encounter: Secondary | ICD-10-CM

## 2021-05-30 NOTE — ED Triage Notes (Signed)
Pt was seen here on 2/11 for having a seizure and he fell hurting his right foot. He reports that it is more swollen and that he feels that it may be infected. No swelling seen or drainage. He reports that they told him that it was sprained. Pt on crutches. No new injury

## 2021-05-30 NOTE — ED Notes (Signed)
See triage note  presents with swelling to right foot  states he had a sz and fell from porch about a week ago  states now area is more swollen and sore

## 2021-05-30 NOTE — Discharge Instructions (Addendum)
Use the Ace bandage as needed for support.  Began to bear weight through the foot using 1 crutch, rest with the foot elevated and take over-the-counter Tylenol or Motrin as needed for pain and swelling.

## 2021-05-30 NOTE — ED Provider Notes (Signed)
[facility] Emergency Department Provider Note     Event Date/Time   First MD Initiated Contact with Patient 05/30/21 1120     (approximate)   History   Foot Pain   HPI  Martin Cooper is a 36 y.o. male with a history of seizures and asthma, known to this ED, presents for reevaluation of right foot pain.  Patient sustained an injury that was aggravated by a seizure on 2/11.  He was evaluated at that time, and found to have a negative x-ray and was discharged with crutches and coban bandage to the foot for an apparent Achilles tendinitis/foot sprain.  Patient presents today denies any reinjury.  He reports that the foot and toes are swollen distal to the dressing and he has concern for infection.  Patient denies any fevers, chills, or sweats.     Physical Exam   Triage Vital Signs: ED Triage Vitals  Enc Vitals Group     BP 05/30/21 1040 122/86     Pulse Rate 05/30/21 1040 65     Resp 05/30/21 1040 17     Temp 05/30/21 1040 97.8 F (36.6 C)     Temp Source 05/30/21 1040 Oral     SpO2 05/30/21 1040 98 %     Weight 05/30/21 1100 150 lb (68 kg)     Height 05/30/21 1100 5\' 7"  (1.702 m)     Head Circumference --      Peak Flow --      Pain Score 05/30/21 1100 8     Pain Loc --      Pain Edu? --      Excl. in [place]? --     Most recent vital signs: Vitals:   05/30/21 1040  BP: 122/86  Pulse: 65  Resp: 17  Temp: 97.8 F (36.6 C)  SpO2: 98%    General Awake, no distress.  CV:  Good peripheral perfusion.  RESP:  Normal effort.  MSK:  Right ankle without obvious deformity, dislocation, or joint effusion.  Normal ankle range of motion appreciated.  Some mild erythema secondary to the compression bandage noted to the forefoot.  Skin is otherwise warm and dry without ecchymosis, edema, or skin breakdown.   ED Results / Procedures / Treatments   Labs (all labs ordered are listed, but only abnormal results are displayed) Labs Reviewed - No data to  display   EKG   RADIOLOGY  No results found.   PROCEDURES:  Critical Care performed: No  Procedures   MEDICATIONS ORDERED IN ED: Medications - No data to display   IMPRESSION / MDM / ASSESSMENT AND PLAN / ED COURSE  I reviewed the triage vital signs and the nursing notes.                              Differential diagnosis includes, but is not limited to, dependent edema, foot sprain, contusion  Patient with subsequent ED evaluation right foot pain, presented today with Coban dressing in place from his initial injury and evaluation 3 days prior.  Patient is in no acute distress and no reinjuries reported.  Exam is benign at this time no signs of any acute infection, peripheral vascular insufficiency, or cellulitis.  Symptoms likely consistent with peripheral edema secondary to dressing and compression.  The dressing is removed, and a simple Ace bandage is applied.  Patient with normal active range of motion, normal blood flow,  and resolution of some of the erythema appreciated.  Patient is discharged at this time to continue to take OTC Tylenol or Motrin as needed.  Follow with primary provider return to the ED if needed.     FINAL CLINICAL IMPRESSION(S) / ED DIAGNOSES   Final diagnoses:  Sprain of right ankle, unspecified ligament, subsequent encounter     Rx / DC Orders   ED Discharge Orders     None        Note:  This document was prepared using Dragon voice recognition software and may include unintentional dictation errors.    Lissa Hoard, PA-C 05/30/21 1616    Concha Se, MD 05/31/21 [phone]

## 2021-06-09 ENCOUNTER — Emergency Department: Payer: Medicaid Other

## 2021-06-09 ENCOUNTER — Other Ambulatory Visit: Payer: Self-pay

## 2021-06-09 ENCOUNTER — Inpatient Hospital Stay
Admission: EM | Admit: 2021-06-09 | Discharge: 2021-06-11 | DRG: 101 | Disposition: A | Discharge status: Home / Self Care | Payer: Medicaid Other | Attending: Internal Medicine | Admitting: Internal Medicine

## 2021-06-09 DIAGNOSIS — J4520 Mild intermittent asthma, uncomplicated: Secondary | ICD-10-CM

## 2021-06-09 DIAGNOSIS — R569 Unspecified convulsions: Secondary | ICD-10-CM

## 2021-06-09 DIAGNOSIS — F1210 Cannabis abuse, uncomplicated: Secondary | ICD-10-CM | POA: Diagnosis present

## 2021-06-09 DIAGNOSIS — G40901 Epilepsy, unspecified, not intractable, with status epilepticus: Principal | ICD-10-CM | POA: Diagnosis present

## 2021-06-09 DIAGNOSIS — Z9114 Patient's other noncompliance with medication regimen: Secondary | ICD-10-CM

## 2021-06-09 DIAGNOSIS — Z23 Encounter for immunization: Secondary | ICD-10-CM

## 2021-06-09 DIAGNOSIS — Z720 Tobacco use: Secondary | ICD-10-CM

## 2021-06-09 DIAGNOSIS — G40909 Epilepsy, unspecified, not intractable, without status epilepticus: Secondary | ICD-10-CM

## 2021-06-09 DIAGNOSIS — F1910 Other psychoactive substance abuse, uncomplicated: Secondary | ICD-10-CM | POA: Diagnosis present

## 2021-06-09 DIAGNOSIS — F1410 Cocaine abuse, uncomplicated: Secondary | ICD-10-CM | POA: Diagnosis present

## 2021-06-09 DIAGNOSIS — Z20822 Contact with and (suspected) exposure to covid-19: Secondary | ICD-10-CM | POA: Diagnosis present

## 2021-06-09 DIAGNOSIS — J45909 Unspecified asthma, uncomplicated: Secondary | ICD-10-CM | POA: Diagnosis present

## 2021-06-09 DIAGNOSIS — Z79899 Other long term (current) drug therapy: Secondary | ICD-10-CM

## 2021-06-09 DIAGNOSIS — K219 Gastro-esophageal reflux disease without esophagitis: Secondary | ICD-10-CM | POA: Insufficient documentation

## 2021-06-09 DIAGNOSIS — F17210 Nicotine dependence, cigarettes, uncomplicated: Secondary | ICD-10-CM | POA: Diagnosis present

## 2021-06-09 LAB — BASIC METABOLIC PANEL
Anion gap: 5 (ref 5–15)
BUN: 11 mg/dL (ref 6–20)
CO2: 25 mmol/L (ref 22–32)
Calcium: 9 mg/dL (ref 8.9–10.3)
Chloride: 105 mmol/L (ref 98–111)
Creatinine, Ser: 1.14 mg/dL (ref 0.61–1.24)
GFR, Estimated: >60 mL/min (ref >60)
Glucose, Bld: 96 mg/dL (ref 70–99)
Potassium: 4.2 mmol/L (ref 3.5–5.1)
Sodium: 135 mmol/L (ref 135–145)

## 2021-06-09 LAB — CBC
HCT: 43.4 % (ref 39.0–52.0)
Hemoglobin: 14.2 g/dL (ref 13.0–17.0)
MCH: 30 pg (ref 26.0–34.0)
MCHC: 32.7 g/dL (ref 30.0–36.0)
MCV: 91.8 fL (ref 80.0–100.0)
Platelets: 298 10*3/uL (ref 150–400)
RBC: 4.73 MIL/uL (ref 4.22–5.81)
RDW: 12.1 % (ref 11.5–15.5)
WBC: 5.9 10*3/uL (ref 4.0–10.5)
nRBC: 0 % (ref 0.0–0.2)

## 2021-06-09 LAB — URINE DRUG SCREEN, QUALITATIVE (ARMC ONLY)
Amphetamines, Ur Screen: NOT DETECTED
Barbiturates, Ur Screen: NOT DETECTED
Benzodiazepine, Ur Scrn: POSITIVE — AB
Cannabinoid 50 Ng, Ur ~~LOC~~: POSITIVE — AB
Cocaine Metabolite,Ur ~~LOC~~: POSITIVE — AB
MDMA (Ecstasy)Ur Screen: NOT DETECTED
Methadone Scn, Ur: NOT DETECTED
Opiate, Ur Screen: NOT DETECTED
Phencyclidine (PCP) Ur S: NOT DETECTED
Tricyclic, Ur Screen: NOT DETECTED

## 2021-06-09 LAB — MAGNESIUM: Magnesium: 1.8 mg/dL (ref 1.7–2.4)

## 2021-06-09 LAB — RESP PANEL BY RT-PCR (FLU A&B, COVID) ARPGX2
Influenza A by PCR: NEGATIVE
Influenza B by PCR: NEGATIVE
SARS Coronavirus 2 by RT PCR: NEGATIVE

## 2021-06-09 LAB — PHENYTOIN LEVEL, TOTAL: Phenytoin Lvl: <2.5 ug/mL — ABNORMAL LOW (ref 10.0–20.0)

## 2021-06-09 MED ORDER — LORAZEPAM 2 MG/ML IJ SOLN
2.0000 mg | INTRAMUSCULAR | Status: DC | PRN
Start: 2021-06-09 — End: 2021-06-11
  Administered 2021-06-10 – 2021-06-11 (×2): 2 mg via INTRAVENOUS
  Filled 2021-06-09 (×2): qty 1

## 2021-06-09 MED ORDER — PHENYTOIN SODIUM EXTENDED 100 MG PO CAPS: 300.0000 mg | ORAL_CAPSULE | Freq: Every day | ORAL | Status: DC

## 2021-06-09 MED ORDER — SODIUM CHLORIDE 0.9 % IV BOLUS
1000.0000 mL | Freq: Once | INTRAVENOUS | Status: AC
Start: 2021-06-09 — End: 2021-06-09
  Administered 2021-06-09: 1000 mL via INTRAVENOUS

## 2021-06-09 MED ORDER — ONDANSETRON 4 MG PO TBDP
4.0000 mg | ORAL_TABLET | Freq: Once | ORAL | Status: AC
  Administered 2021-06-09: 4 mg via ORAL
  Filled 2021-06-09: qty 1

## 2021-06-09 MED ORDER — ENOXAPARIN SODIUM 40 MG/0.4ML IJ SOSY
40.0000 mg | PREFILLED_SYRINGE | INTRAMUSCULAR | Status: DC
  Administered 2021-06-09 – 2021-06-10 (×2): 40 mg via SUBCUTANEOUS
  Filled 2021-06-09 (×2): qty 0.4

## 2021-06-09 MED ORDER — LEVETIRACETAM IN NACL 1000 MG/100ML IV SOLN
1000.0000 mg | Freq: Once | INTRAVENOUS | Status: AC
  Administered 2021-06-09: 1000 mg via INTRAVENOUS
  Filled 2021-06-09: qty 100

## 2021-06-09 MED ORDER — LORAZEPAM 2 MG/ML IJ SOLN: 2.0000 mg | Freq: Once | INTRAMUSCULAR | Status: AC

## 2021-06-09 MED ORDER — LORAZEPAM 2 MG/ML IJ SOLN
INTRAMUSCULAR | Status: AC
Start: 2021-06-09 — End: 2021-06-10
  Filled 2021-06-09: qty 1

## 2021-06-09 MED ORDER — LORAZEPAM 2 MG/ML IJ SOLN
INTRAMUSCULAR | Status: AC
  Administered 2021-06-09: 2 mg via INTRAVENOUS
  Filled 2021-06-09: qty 1

## 2021-06-09 MED ORDER — NICOTINE 21 MG/24HR TD PT24
21.0000 mg | MEDICATED_PATCH | Freq: Every day | TRANSDERMAL | Status: DC
  Administered 2021-06-10 – 2021-06-11 (×2): 21 mg via TRANSDERMAL
  Filled 2021-06-09 (×2): qty 1

## 2021-06-09 MED ORDER — SODIUM CHLORIDE 0.9 % IV SOLN
250.0000 mg | Freq: Once | INTRAVENOUS | Status: AC
  Administered 2021-06-09: 250 mg via INTRAVENOUS
  Filled 2021-06-09: qty 5

## 2021-06-09 MED ORDER — ONDANSETRON HCL 4 MG/2ML IJ SOLN
4.0000 mg | Freq: Three times a day (TID) | INTRAMUSCULAR | Status: DC | PRN
Start: 2021-06-09 — End: 2021-06-11

## 2021-06-09 MED ORDER — FOSPHENYTOIN SODIUM 500 MG PE/10ML IJ SOLN
1000.0000 mg | Freq: Once | INTRAMUSCULAR | Status: AC
  Administered 2021-06-09: 1000 mg via INTRAVENOUS
  Filled 2021-06-09: qty 20

## 2021-06-09 MED ORDER — SODIUM CHLORIDE 0.9 % IV SOLN
INTRAVENOUS | Status: DC
Start: 2021-06-09 — End: 2021-06-10

## 2021-06-09 MED ORDER — PHENYTOIN SODIUM EXTENDED 30 MG PO CAPS
330.0000 mg | ORAL_CAPSULE | Freq: Every day | ORAL | Status: DC
  Administered 2021-06-10 (×2): 330 mg via ORAL
  Filled 2021-06-09 (×3): qty 1

## 2021-06-09 MED ORDER — ALBUTEROL SULFATE (2.5 MG/3ML) 0.083% IN NEBU
3.0000 mL | INHALATION_SOLUTION | RESPIRATORY_TRACT | Status: DC | PRN
Start: 2021-06-09 — End: 2021-06-11

## 2021-06-09 MED ORDER — LEVETIRACETAM IN NACL 1000 MG/100ML IV SOLN
1000.0000 mg | Freq: Two times a day (BID) | INTRAVENOUS | Status: DC
  Administered 2021-06-10 (×2): 1000 mg via INTRAVENOUS
  Filled 2021-06-09 (×3): qty 100

## 2021-06-09 MED ORDER — LORAZEPAM 2 MG/ML IJ SOLN: 2.0000 mg | Freq: Once | INTRAMUSCULAR | Status: DC

## 2021-06-09 MED ORDER — ACETAMINOPHEN 325 MG PO TABS
650.0000 mg | ORAL_TABLET | Freq: Four times a day (QID) | ORAL | Status: DC | PRN
  Administered 2021-06-09: 650 mg via ORAL
  Filled 2021-06-09: qty 2
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
## 2021-06-09 NOTE — ED Notes (Signed)
Report given to Jennifer W RN 
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
## 2021-06-09 NOTE — ED Notes (Signed)
Seizure pads applied to bed. Mother at bedside. Pt still postictal, slightly confused.

## 2021-06-09 NOTE — H&P (Signed)
History and Physical    [name] [id number] DOB: June 24, 1985 DOA: 06/09/2021  Referring MD/NP/PA:   PCP: [place]   Patient coming from:  The patient is coming from home.    Chief Complaint: Seizure  HPI: Martin Cooper is a 36 y.o. male with medical history significant of seizure, tobacco abuse, cocaine abuse, marijuana abuse, asthma, GERD, who presents with seizure.  Patient is in postictal status, cannot provide medical history.  Per her girlfriend at bedside, patient did not taking his seizure medications (Keppra and phenytoin) in the past several days.  He developed seizure.  He has had at least 4 times of seizure since last night.  Patient had a witnessed tonic-clonic seizure in the ED.  Per girlfriend, patient does not have chest pain, cough, shortness breath.  No fever or chills.  No nausea vomiting, diarrhea or abdominal pain.  No symptoms of UTI.  Data Reviewed and ED Course: pt was found to have WBC 5.9, negative COVID PCR, phenytoin level is low <2.5, electrolytes renal function okay, temperature normal, blood pressure 117/92, heart rate 66, RR 16, oxygen saturation 98% on room air.  CT head is negative for acute intracranial abnormalities.  CT of C-spine is negative for acute injury.  Patient is placed on telemetry bed for observation.  EKG: I have personally reviewed.  Sinus rhythm, QTc 413, LVH, J-point elevation   Review of Systems: Could not reviewed since patient is postictal status  Allergy: No Known Allergies  Past Medical History:  Diagnosis Date   Asthma    Seizures ([place])     History reviewed. No pertinent surgical history.  Social History:  reports that he has been smoking cigarettes. He has been smoking an average of .5 packs per day. He has never used smokeless tobacco. He reports current alcohol use. He reports current drug use. Drugs: Cocaine and Marijuana.  Family History:  Family History  Problem Relation Age of Onset    Hypertension Mother    Kidney disease Father      Prior to Admission medications   Medication Sig Start Date End Date Taking? Authorizing Provider  albuterol (PROVENTIL HFA;VENTOLIN HFA) 108 (90 Base) MCG/ACT inhaler Inhale 2 puffs into the lungs every 4 (four) hours as needed for wheezing or shortness of breath. 12/30/16   Cuthriell, Charline Bills, PA-C  fluticasone (FLONASE) 50 MCG/ACT nasal spray Place 1 spray into both nostrils once daily. 05/18/21   Lannie Fields, PA-C  gabapentin (NEURONTIN) 300 MG capsule Take 1 capsule by mouth daily.    [provider]  levETIRAcetam (KEPPRA) 500 MG tablet Take 2 tablets (1,000 mg total) by mouth 2 (two) times daily. 03/14/21 05/13/21  Merlyn Lot, MD  ondansetron (ZOFRAN ODT) 4 MG disintegrating tablet Take 1 tablet (4 mg total) by mouth every 8 (eight) hours as needed for nausea or vomiting. 02/06/21   Harvest Dark, MD  pantoprazole (PROTONIX) 40 MG tablet Take 1 tablet (40 mg total) by mouth daily for 7 days. 03/29/21 04/05/21  Lucrezia Starch, MD  phenytoin (DILANTIN) 30 MG ER capsule Take 1 capsule (30 mg total) by mouth at bedtime. 03/14/21 03/14/22  Merlyn Lot, MD  phenytoin (DILANTIN) 300 MG ER capsule Take 1 capsule (300 mg total) by mouth at bedtime. 03/14/21 05/13/21  Merlyn Lot, MD    Physical Exam: Vitals:   06/09/21 1515 06/09/21 1530 06/09/21 1800 06/09/21 1900  BP:  125/83 122/72 (!) 88/69  Pulse: (!) 52 (!) 55 67 67  Resp:   13 (!) 21  Temp:      TempSrc:      SpO2: 100% 97% 100% 99%  Weight:      Height:       General: Not in acute distress HEENT:       Eyes: PERRL, EOMI, no scleral icterus.       ENT: No discharge from the ears and nose, no pharynx injection, no tonsillar enlargement.        Neck: No JVD, no bruit, no mass felt. Heme: No neck lymph node enlargement. Cardiac: S1/S2, RRR, No murmurs, No gallops or rubs. Respiratory: No rales, wheezing, rhonchi or rubs. GI: Soft, nondistended,  nontender, no rebound pain, no organomegaly, BS present. GU: No hematuria Ext: No pitting leg edema bilaterally. 1+DP/PT pulse bilaterally. Musculoskeletal: No joint deformities, No joint redness or warmth, no limitation of ROM in spin. Skin: No rashes.  Neuro: Patient is in postictal status, confused, knows his own name, not orientated to the place and time, Cranial nerves II-XII grossly intact, moves all extremities. Psych: Patient is not psychotic, no suicidal or hemocidal ideation.  Labs on Admission: I have personally reviewed following labs and imaging studies  CBC: Recent Labs  Lab 06/09/21 1112  WBC 5.9  HGB 14.2  HCT 43.4  MCV 91.8  PLT [id number]   Basic Metabolic Panel: Recent Labs  Lab 06/09/21 1112  NA 135  K 4.2  CL 105  CO2 25  GLUCOSE 96  BUN 11  CREATININE 1.14  CALCIUM 9.0   GFR: Estimated Creatinine Clearance: 83.8 mL/min (by C-G formula based on SCr of 1.14 mg/dL). Liver Function Tests: No results for input(s): AST, ALT, ALKPHOS, BILITOT, PROT, ALBUMIN in the last 168 hours. No results for input(s): LIPASE, AMYLASE in the last 168 hours. No results for input(s): AMMONIA in the last 168 hours. Coagulation Profile: No results for input(s): INR, PROTIME in the last 168 hours. Cardiac Enzymes: No results for input(s): CKTOTAL, CKMB, CKMBINDEX, TROPONINI in the last 168 hours. BNP (last 3 results) No results for input(s): PROBNP in the last 8760 hours. HbA1C: No results for input(s): HGBA1C in the last 72 hours. CBG: No results for input(s): GLUCAP in the last 168 hours. Lipid Profile: No results for input(s): CHOL, HDL, LDLCALC, TRIG, CHOLHDL, LDLDIRECT in the last 72 hours. Thyroid Function Tests: No results for input(s): TSH, T4TOTAL, FREET4, T3FREE, THYROIDAB in the last 72 hours. Anemia Panel: No results for input(s): VITAMINB12, FOLATE, FERRITIN, TIBC, IRON, RETICCTPCT in the last 72 hours. Urine analysis:    Component Value Date/Time    COLORURINE YELLOW 05/18/2021 1408   APPEARANCEUR CLEAR (A) 05/18/2021 1408   APPEARANCEUR Clear 12/24/2013 1256   LABSPEC 1.025 05/18/2021 1408   LABSPEC 1.014 12/24/2013 1256   PHURINE 6.0 05/18/2021 1408   GLUCOSEU NEGATIVE 05/18/2021 1408   GLUCOSEU >=500 12/24/2013 1256   HGBUR NEGATIVE 05/18/2021 1408   BILIRUBINUR NEGATIVE 05/18/2021 1408   BILIRUBINUR Negative 12/24/2013 1256   KETONESUR NEGATIVE 05/18/2021 1408   PROTEINUR TRACE (A) 05/18/2021 1408   NITRITE NEGATIVE 05/18/2021 1408   LEUKOCYTESUR NEGATIVE 05/18/2021 1408   LEUKOCYTESUR Negative 12/24/2013 1256   Sepsis Labs: @LABRCNTIP (procalcitonin:4,lacticidven:4) ) Recent Results (from the past 240 hour(s))  Resp Panel by RT-PCR (Flu A&B, Covid) Nasopharyngeal Swab     Status: None   Collection Time: 06/09/21 12:30 PM   Specimen: Nasopharyngeal Swab; Nasopharyngeal(NP) swabs in vial transport medium  Result Value Ref Range Status   SARS Coronavirus 2 by  RT PCR NEGATIVE NEGATIVE Final    Comment: (NOTE) SARS-CoV-2 target nucleic acids are NOT DETECTED.  The SARS-CoV-2 RNA is generally detectable in upper respiratory specimens during the acute phase of infection. The lowest concentration of SARS-CoV-2 viral copies this assay can detect is 138 copies/mL. A negative result does not preclude SARS-Cov-2 infection and should not be used as the sole basis for treatment or other patient management decisions. A negative result may occur with  improper specimen collection/handling, submission of specimen other than nasopharyngeal swab, presence of viral mutation(s) within the areas targeted by this assay, and inadequate number of viral copies(<138 copies/mL). A negative result must be combined with clinical observations, patient history, and epidemiological information. The expected result is Negative.  Fact Sheet for Patients:  EntrepreneurPulse.com.au  Fact Sheet for Healthcare Providers:   IncredibleEmployment.be  This test is no t yet approved or cleared by the [place] FDA and  has been authorized for detection and/or diagnosis of SARS-CoV-2 by FDA under an Emergency Use Authorization (EUA). This EUA will remain  in effect (meaning this test can be used) for the duration of the COVID-19 declaration under Section 564(b)(1) of the Act, 21 U.S.C.section 360bbb-3(b)(1), unless the authorization is terminated  or revoked sooner.       Influenza A by PCR NEGATIVE NEGATIVE Final   Influenza B by PCR NEGATIVE NEGATIVE Final    Comment: (NOTE) The Xpert Xpress SARS-CoV-2/FLU/RSV plus assay is intended as an aid in the diagnosis of influenza from Nasopharyngeal swab specimens and should not be used as a sole basis for treatment. Nasal washings and aspirates are unacceptable for Xpert Xpress SARS-CoV-2/FLU/RSV testing.  Fact Sheet for Patients: EntrepreneurPulse.com.au  Fact Sheet for Healthcare Providers: IncredibleEmployment.be  This test is not yet approved or cleared by the [place] FDA and has been authorized for detection and/or diagnosis of SARS-CoV-2 by FDA under an Emergency Use Authorization (EUA). This EUA will remain in effect (meaning this test can be used) for the duration of the COVID-19 declaration under Section 564(b)(1) of the Act, 21 U.S.C. section 360bbb-3(b)(1), unless the authorization is terminated or revoked.  Performed at [facility], [address], [place],  [address]      Radiological Exams on Admission: CT HEAD WO CONTRAST (5MM)  Result Date: 06/09/2021 CLINICAL DATA:  Seizure last night, amnesia head trauma EXAM: CT HEAD WITHOUT CONTRAST TECHNIQUE: Contiguous axial images were obtained from the base of the skull through the vertex without intravenous contrast. RADIATION DOSE REDUCTION: This exam was performed according to the departmental dose-optimization  program which includes automated exposure control, adjustment of the mA and/or kV according to patient size and/or use of iterative reconstruction technique. COMPARISON:  CT head 05/07/2021 FINDINGS: Brain: There is no evidence of acute intracranial hemorrhage, extra-axial fluid collection, or acute infarct. Parenchymal volume is normal. Ventricles are normal in size. Gray-white differentiation is preserved. There is no mass lesion. There is no midline shift. Vascular: No hyperdense vessel or unexpected calcification. Skull: Normal. Negative for fracture or focal lesion. Sinuses/Orbits: Imaged paranasal sinuses are clear. The imaged globes and orbits are unremarkable. Other: None. IMPRESSION: Normal head CT. Electronically Signed   By: Valetta Mole M.D.   On: 06/09/2021 13:22   CT Cervical Spine Wo Contrast  Result Date: 06/09/2021 CLINICAL DATA:  Trauma EXAM: CT CERVICAL SPINE WITHOUT CONTRAST TECHNIQUE: Multidetector CT imaging of the cervical spine was performed without intravenous contrast. Multiplanar CT image reconstructions were also generated. RADIATION DOSE REDUCTION: This exam  was performed according to the departmental dose-optimization program which includes automated exposure control, adjustment of the mA and/or kV according to patient size and/or use of iterative reconstruction technique. COMPARISON:  03/03/2021 FINDINGS: Alignment: Alignment of posterior margins of vertebral bodies is unremarkable. Skull base and vertebrae: No recent fracture is seen. Degenerative changes are noted in cervical spine, particularly prominent at C5-C6 level. Soft tissues and spinal canal: Posterior bony spurs causing extrinsic pressure over the ventral margin of thecal sac at C5-C6 level with possible mild spinal stenosis at the level of body C6 vertebra. Disc levels: There is mild to moderate encroachment of neural foramina at C5-C6 level. There is mild encroachment of neural foramina at C6-C7 level. Upper chest:  Unremarkable. Other: Thyroid is enlarged in size. IMPRESSION: No recent fracture is seen in the cervical spine. Cervical spondylosis with encroachment of neural foramina at C5-C6 and C6-C7 levels, more so at C5-C6 level. Electronically Signed   By: Elmer Picker M.D.   On: 06/09/2021 13:21      Assessment/Plan Principal Problem:   Seizure [facility]) Active Problems:   Asthma   Tobacco abuse   Polysubstance abuse ([place])  Seizure ([place]): Most likely due to noncompliance with seizure medications.  Patient is supposed to take Keppra and phenytoin at home, but did not take his meds for several days.  CT head is negative for acute intracranial abnormalities.  Dr. Leonel Ramsay of neurology is consulted  -Placed on telemetry bed for observation -Seizure precaution -As needed Ativan for seizure -Patient is loaded with Keppra 1000 mg and phenytoin to 250 mg in ED -Continue home Keppra and phenytoin  Asthma: Stable -Bronchodilators  Tobacco abuse and polysubstance abuse -check UDS -Nicotine patch -need education when mental status improves.      DVT ppx: SQ Lovenox  Code Status: Full code  Family Communication: Yes, patient's girlfriend    at bed side.     Disposition Plan:  Anticipate discharge back to previous environment  Consults called: Dr. Leonel Ramsay of neurology  Admission status and Level of care: Med-Surg:    for obs     Severity of Illness:  The appropriate patient status for this patient is OBSERVATION. Observation status is judged to be reasonable and necessary in order to provide the required intensity of service to ensure the patient's safety. The patient's presenting symptoms, physical exam findings, and initial radiographic and laboratory data in the context of their medical condition is felt to place them at decreased risk for further clinical deterioration. Furthermore, it is anticipated that the patient will be medically stable for discharge from the hospital  within 2 midnights of admission.        Date of Service 06/09/2021    [facility]   If 7PM-7AM, please contact night-coverage [email or web address] 06/09/2021, 9:15 PM

## 2021-06-09 NOTE — ED Notes (Signed)
Pt had another seizure. EDP was just at bedside and 2mg  ativan was given IV. Pt is postictal with eyes closed. Family at bedside. Pt is 100% on RA and being placed on cardiac monitoring.

## 2021-06-09 NOTE — ED Triage Notes (Signed)
Pt to ED via [facility] from home. Pt reports he doesn't remember anything. Pt states he does not know his name or date of birthday. EMS called by girlfriend who reports he had a seizure las tonight and today. Pt states he currently doesn't have food at his house so has not taken his seizure medication for the past few days.

## 2021-06-09 NOTE — Consult Note (Signed)
Neurology Consultation Reason for Consult: Seizures Referring Physician: Michiel Sites  CC: Seizures  History is obtained from: Family member  HPI: Martin Cooper is a 36 y.o. male with a history of seizures on Keppra and Dilantin, previously on gabapentin and phenobarbital who presents with recurrent seizures in the setting of running out of his medicines.  His family member reports that been several months since his previous seizure, but then he ran out of his medicines and over the past day he has had five or six seizures.  He was given Ativan in emergency department, and since then has been seizure-free but somnolent and sedated.    ROS: Unable to obtain due to altered mental status.   Past Medical History:  Diagnosis Date   Asthma    Seizures ([facility])      History reviewed. No pertinent family history.   Social History:  reports that he has been smoking cigarettes. He has been smoking an average of .5 packs per day. He has never used smokeless tobacco. He reports current alcohol use. He reports current drug use. Drugs: Cocaine and Marijuana.   Exam: Current vital signs: BP 125/83    Pulse (!) 55    Temp 98 F (36.7 C) (Oral)    Resp 16    Ht 5\' 7"  (1.702 m)    Wt 68 kg    SpO2 97%    BMI 23.49 kg/m  Vital signs in last 24 hours: Temp:  [98 F (36.7 C)] 98 F (36.7 C) (02/24 1110) Pulse Rate:  [52-95] 55 (02/24 1530) Resp:  [16-18] 16 (02/24 1245) BP: (111-125)/(83-92) 125/83 (02/24 1530) SpO2:  [97 %-100 %] 97 % (02/24 1530) Weight:  [68 kg] 68 kg (02/24 1110)   Physical Exam  Constitutional: Appears well-developed and well-nourished.  Psych: Affect appropriate to situation Eyes: No scleral injection HENT: No OP obstruction MSK: no joint deformities.  Cardiovascular: Normal rate and regular rhythm.  Respiratory: Effort normal, non-labored breathing GI: Soft.  No distension. There is no tenderness.  Skin: WDI  Neuro: Mental Status: Patient is lethargic but  arousable, he gives the month is March, year is 28. No signs of aphasia or neglect Cranial Nerves: II: Visual Fields are full. Pupils are equal, round, and reactive to light.   III,IV, VI: EOMI without ptosis or diploplia.  V: Facial sensation is symmetric to temperature VII: Facial movement is symmetric.  VIII: hearing is intact to voice X: Uvula elevates symmetrically XI: Shoulder shrug is symmetric. XII: tongue is midline without atrophy or fasciculations.  Motor: Tone is normal. Bulk is normal. 5/5 strength was present in all four extremities.  Sensory: Sensation is symmetric to light touch and temperature in the arms and legs. Cerebellar: FNF intact      I have reviewed labs in epic and the results pertinent to this consultation are: PHT less than 2.5 UDS positive for cocaine  I have reviewed the images obtained: CT head-unremarkable  Impression: 36 year old male with multiple breakthrough seizures in the setting of medication noncompliance, and cocaine use.  He will need to be restarted on his home medications, and encouraged to stop using cocaine.  Recommendations: 1) Dilantin, 15 mg/kg x 1 (already received 250 mg) 2) restart home Dilantin 330 mg nightly 3) restart Keppra at home dose of 1 g twice daily 4) once he is more awake, encourage cocaine cessation   31, MD [facility] [phone]  If 7pm- 7am, please page neurology on call as listed in  AMION.

## 2021-06-09 NOTE — ED Notes (Signed)
Cbg 98

## 2021-06-09 NOTE — ED Provider Notes (Addendum)
[facility] Provider Note    Event Date/Time   First MD Initiated Contact with Patient 06/09/21 1138     (approximate)   History   Seizures   HPI  Martin Cooper is a 36 y.o. male with past medical history of asthma and seizure disorder prescribed Dilantin and Keppra apparently has not taken these in the last 2 or 3 days due to "a lot going on" her presents coming by his girlfriend via EMS for evaluation of 4 seizures that occurred last night starting around 1 AM.  He is not exactly clear why there was a delay in the emergency room and the patient states he was worried about a ride home.  He states he does not remember what happened but his girlfriend states that she fell 4 episodes over the course of an hour and last a couple minutes of generalized shaking.  She states he fell backward hitting back of his head.  He states he is little bit of a headache as well as nauseous but otherwise has no acute pain.  No recent fevers or chills cough vomiting diarrhea or other sick symptoms.  After his seizures last night he did take 2 tablets of his Keppra but otherwise has not been taking his Dilantin.  He denies any significant problems sleeping or illicit drug use but states he had 2 beers yesterday.  He is not a daily drinker he states.  He states he did not follow-up with his neurologist because he was told it was $200 and his Medicare would not cover it.  He denies any other acute concerns at this time.    Past Medical History:  Diagnosis Date   Asthma    Seizures [facility])      Physical Exam  Triage Vital Signs: ED Triage Vitals [06/09/21 1110]  Enc Vitals Group     BP 111/86     Pulse Rate 60     Resp 18     Temp 98 F (36.7 C)     Temp Source Oral     SpO2 98 %     Weight 150 lb (68 kg)     Height 5\' 7"  (1.702 m)     Head Circumference      Peak Flow      Pain Score 0     Pain Loc      Pain Edu?      Excl. in GC?     Most recent vital signs: Vitals:    06/09/21 1230 06/09/21 1245  BP:  (!) 117/92  Pulse: 62 66  Resp:  16  Temp:    SpO2: 100% 100%    General: Awake, no distress.  CV:  Good peripheral perfusion.  2+ radial pulses. Resp:  Normal effort.  Abd:  No distention.  Other:   CN II-XII grossly intact  No tenderness/deformities over the C/T/L spine  No focal TTP over b/l shoulders, elbows, wrists, hips, knees, ankles  2+ b/l radial and PD pulses   No other obvious trauma to face, scalp ,head, neck or torso  Patient is not oriented and somewhat slow to answer questions.  He does follow commands in all extremities.  Sensation is intact to light touch in all extremities.    ED Results / Procedures / Treatments  Labs (all labs ordered are listed, but only abnormal results are displayed) Labs Reviewed  PHENYTOIN LEVEL, TOTAL - Abnormal; Notable for the following components:  Result Value   Phenytoin Lvl <2.5 (*)    All other components within normal limits  RESP PANEL BY RT-PCR (FLU A&B, COVID) ARPGX2  BASIC METABOLIC PANEL  CBC  CBG MONITORING, ED     EKG  EKG is remarkable for is normal sinus bradycardia with a ventricular rate of 56, normal axis, nonspecific change in anterior leads and possible early repole in V6 without other clear evidence of acute ischemia or significant rhythm.   RADIOLOGY CT head and C-spine interpretation showed no evidence of skull fracture, acute C-spine injury, intracranial hemorrhage, mass effect, edema or other acute process.  Asked reviewed radiology's findings and agree with the interpretation of no acute process there is some evidence on the C-spine of some foraminal narrowing but no other acute process.    PROCEDURES:  Critical Care performed: Yes, see critical care procedure note(s)  .Critical Care Performed by: Gilles Chiquito, MD Authorized by: Gilles Chiquito, MD   Critical care provider statement:    Critical care time (minutes):  30   Critical care was  necessary to treat or prevent imminent or life-threatening deterioration of the following conditions:  CNS failure or compromise   Critical care was time spent personally by me on the following activities:  Development of treatment plan with patient or surrogate, discussions with consultants, evaluation of patient's response to treatment, examination of patient, ordering and review of laboratory studies, ordering and review of radiographic studies, ordering and performing treatments and interventions, pulse oximetry, re-evaluation of patient's condition and review of old charts    MEDICATIONS ORDERED IN ED: Medications  phenytoin (DILANTIN) 250 mg in sodium chloride 0.9 % 100 mL IVPB (250 mg Intravenous New Bag/Given 06/09/21 1324)  LORazepam (ATIVAN) injection 2 mg (0 mg Intravenous Hold 06/09/21 1341)  ondansetron (ZOFRAN-ODT) disintegrating tablet 4 mg (4 mg Oral Given 06/09/21 1216)  levETIRAcetam (KEPPRA) IVPB 1000 mg/100 mL premix (0 mg Intravenous Stopped 06/09/21 1242)  LORazepam (ATIVAN) injection 2 mg (2 mg Intravenous Given 06/09/21 1229)  levETIRAcetam (KEPPRA) IVPB 1000 mg/100 mL premix (0 mg Intravenous Stopped 06/09/21 1321)     IMPRESSION / MDM / ASSESSMENT AND PLAN / ED COURSE  I reviewed the triage vital signs and the nursing notes.                              Differential diagnosis includes, but is not limited to breakthrough seizure in the setting of medication noncompliance in addition to possible lowering of seizure threshold from some alcohol use.  He denies any other sick symptoms or recent trauma.  Given he has a headache and some nausea and reportedly did strike his head I will obtain a CT head and C-spine to assess for occult intracranial injury or C-spine injury.  He otherwise has no focal evidence of trauma on exam and is otherwise neurovascular intact.  I follows patient for CVA and he does not appear septic  EKG is remarkable for is normal sinus bradycardia with a  ventricular rate of 56, normal axis, nonspecific change in anterior leads and possible early repole in V6 without other clear evidence of acute ischemia or significant rhythm.  CT head and C-spine interpretation showed no evidence of skull fracture, acute C-spine injury, intracranial hemorrhage, mass effect, edema or other acute process.  Asked reviewed radiology's findings and agree with the interpretation of no acute process there is some evidence on the C-spine of some foraminal narrowing  but no other acute process.  BMP shows no significant electrolyte or metabolic derangements.  CBC without leukocytosis or acute anemia.  While undergoing initial work-up patient did have what appeared to be another seizure with eyes rolling in the back of his head without tonic-clonic jerking.  This resolved after 1 or 2 minutes with 2 mg of IV Ativan.  Approximately an hour later he had another episode where he became unresponsive and had some shaking in his arms but this resolved prior to Ativan being given.  This lasted less than a minute or 2.  Neurology consulted who agreed with IV AEDs and admission.  I discussed with admitting physician and will admit for further evaluation and management for status this patient has had over 6 seizures without complete return to baseline today.      FINAL CLINICAL IMPRESSION(S) / ED DIAGNOSES   Final diagnoses:  Seizure (HCC)  Status epilepticus (HCC)     Rx / DC Orders   ED Discharge Orders     None        Note:  This document was prepared using Dragon voice recognition software and may include unintentional dictation errors.   Gilles Chiquito, MD 06/09/21 1349    Gilles Chiquito, MD 06/09/21 [phone]

## 2021-06-09 NOTE — ED Notes (Signed)
Sent med message for dilantin to be sent to [facility] ED.

## 2021-06-09 NOTE — ED Notes (Signed)
Patient's girlfriend came out and said patient was seizing. Dr. Katrinka Blazing informed. Patient was laying on right side, tonic-clonic movements. Patient stopped seizing and was able to speak with Dr. Katrinka Blazing. Ativan held at this time. Dilantin is infusing.

## 2021-06-10 ENCOUNTER — Encounter: Payer: Self-pay | Admitting: Internal Medicine

## 2021-06-10 DIAGNOSIS — K219 Gastro-esophageal reflux disease without esophagitis: Secondary | ICD-10-CM | POA: Diagnosis present

## 2021-06-10 DIAGNOSIS — J45909 Unspecified asthma, uncomplicated: Secondary | ICD-10-CM | POA: Diagnosis present

## 2021-06-10 DIAGNOSIS — R569 Unspecified convulsions: Secondary | ICD-10-CM

## 2021-06-10 DIAGNOSIS — F17210 Nicotine dependence, cigarettes, uncomplicated: Secondary | ICD-10-CM | POA: Diagnosis present

## 2021-06-10 DIAGNOSIS — Z23 Encounter for immunization: Secondary | ICD-10-CM | POA: Diagnosis not present

## 2021-06-10 DIAGNOSIS — Z9114 Patient's other noncompliance with medication regimen: Secondary | ICD-10-CM | POA: Diagnosis not present

## 2021-06-10 DIAGNOSIS — F1210 Cannabis abuse, uncomplicated: Secondary | ICD-10-CM | POA: Diagnosis present

## 2021-06-10 DIAGNOSIS — F1410 Cocaine abuse, uncomplicated: Secondary | ICD-10-CM | POA: Diagnosis present

## 2021-06-10 DIAGNOSIS — Z20822 Contact with and (suspected) exposure to covid-19: Secondary | ICD-10-CM | POA: Diagnosis present

## 2021-06-10 DIAGNOSIS — Z79899 Other long term (current) drug therapy: Secondary | ICD-10-CM | POA: Diagnosis not present

## 2021-06-10 DIAGNOSIS — G40901 Epilepsy, unspecified, not intractable, with status epilepticus: Secondary | ICD-10-CM | POA: Diagnosis present

## 2021-06-10 LAB — HIV ANTIBODY (ROUTINE TESTING W REFLEX): HIV Screen 4th Generation wRfx: NONREACTIVE

## 2021-06-10 LAB — PHENYTOIN LEVEL, TOTAL: Phenytoin Lvl: 16.7 ug/mL (ref 10.0–20.0)

## 2021-06-10 LAB — CBG MONITORING, ED: Glucose-Capillary: 80 mg/dL (ref 70–99)

## 2021-06-10 MED ORDER — SODIUM CHLORIDE 0.9 % IV BOLUS
1000.0000 mL | Freq: Once | INTRAVENOUS | Status: AC
  Administered 2021-06-10: 1000 mL via INTRAVENOUS

## 2021-06-10 MED ORDER — INFLUENZA VAC SPLIT QUAD 0.5 ML IM SUSY
0.5000 mL | PREFILLED_SYRINGE | INTRAMUSCULAR | Status: AC
  Administered 2021-06-11: 0.5 mL via INTRAMUSCULAR
  Filled 2021-06-10: qty 0.5

## 2021-06-10 MED ORDER — LEVETIRACETAM 500 MG PO TABS: 1000.0000 mg | ORAL_TABLET | Freq: Two times a day (BID) | ORAL | Status: DC

## 2021-06-10 MED ORDER — LEVETIRACETAM 500 MG PO TABS
1000.0000 mg | ORAL_TABLET | Freq: Two times a day (BID) | ORAL | Status: DC
  Administered 2021-06-10 – 2021-06-11 (×2): 1000 mg via ORAL
  Filled 2021-06-10 (×2): qty 2

## 2021-06-10 NOTE — Progress Notes (Signed)
Chaplain On-Call responded to Spiritual Care Consult Order from [facility], RN, for prayer with the patient.  Met the patient and his girlfriend Martin Cooper.  Provided spiritual and emotional support and prayer with them.  Chaplain Pollyann Samples M.Div., [facility]

## 2021-06-10 NOTE — Progress Notes (Signed)
Per nursing staff "Patient's girlfriend came into hallway to let RN know that patient was having seizure like activity. RN followed into room to find patient laying on his right side with a left arm tremor. Patient able to open eyes when spoken to and track gaze to second nurse coming into the room." Primary RN then entered the room to patient fully awake and without tremors. VSS. Ativan given. Dr. Allena Katz made aware of the above.

## 2021-06-10 NOTE — Progress Notes (Signed)
[facility]  - [place] [facility]   PATIENT NAME: Martin Cooper    MR#:  [id number]  DATE OF BIRTH:  July 06, 1985  SUBJECTIVE:  girlfriend and mother at bedside. Patient comes in with all few seizures at home. Has not been taking his medications-- patient unable to give a clear reason for not taking his meds  No seizures since patient has been in the ER  VITALS:  Blood pressure 107/70, pulse (!) 59, temperature 98.4 F (36.9 C), temperature source Oral, resp. rate 19, height 5\' 7"  (1.702 m), weight 68 kg, SpO2 100 %.  PHYSICAL EXAMINATION:   GENERAL:  36 y.o.-year-old patient lying in the bed with no acute distress. Poor hygiene LUNGS: Normal breath sounds bilaterally, no wheezing, rales, rhonchi.  CARDIOVASCULAR: S1, S2 normal. No murmurs, rubs, or gallops.  ABDOMEN: Soft, nontender, nondistended. Bowel sounds present.  EXTREMITIES: No  edema b/l.    NEUROLOGIC: nonfocal  patient is alert and awake SKIN: No obvious rash, lesion, or ulcer.   LABORATORY PANEL:  CBC Recent Labs  Lab 06/09/21 1112  WBC 5.9  HGB 14.2  HCT 43.4  PLT 298    Chemistries  Recent Labs  Lab 06/09/21 1112  NA 135  K 4.2  CL 105  CO2 25  GLUCOSE 96  BUN 11  CREATININE 1.14  CALCIUM 9.0  MG 1.8   Cardiac Enzymes No results for input(s): TROPONINI in the last 168 hours. RADIOLOGY:  CT HEAD WO CONTRAST (06/11/21)  Result Date: 06/09/2021 CLINICAL DATA:  Seizure last night, amnesia head trauma EXAM: CT HEAD WITHOUT CONTRAST TECHNIQUE: Contiguous axial images were obtained from the base of the skull through the vertex without intravenous contrast. RADIATION DOSE REDUCTION: This exam was performed according to the departmental dose-optimization program which includes automated exposure control, adjustment of the mA and/or kV according to patient size and/or use of iterative reconstruction technique. COMPARISON:  CT head 05/07/2021 FINDINGS: Brain: There is no evidence of acute  intracranial hemorrhage, extra-axial fluid collection, or acute infarct. Parenchymal volume is normal. Ventricles are normal in size. Gray-white differentiation is preserved. There is no mass lesion. There is no midline shift. Vascular: No hyperdense vessel or unexpected calcification. Skull: Normal. Negative for fracture or focal lesion. Sinuses/Orbits: Imaged paranasal sinuses are clear. The imaged globes and orbits are unremarkable. Other: None. IMPRESSION: Normal head CT. Electronically Signed   By: 05/09/2021 M.D.   On: 06/09/2021 13:22   CT Cervical Spine Wo Contrast  Result Date: 06/09/2021 CLINICAL DATA:  Trauma EXAM: CT CERVICAL SPINE WITHOUT CONTRAST TECHNIQUE: Multidetector CT imaging of the cervical spine was performed without intravenous contrast. Multiplanar CT image reconstructions were also generated. RADIATION DOSE REDUCTION: This exam was performed according to the departmental dose-optimization program which includes automated exposure control, adjustment of the mA and/or kV according to patient size and/or use of iterative reconstruction technique. COMPARISON:  03/03/2021 FINDINGS: Alignment: Alignment of posterior margins of vertebral bodies is unremarkable. Skull base and vertebrae: No recent fracture is seen. Degenerative changes are noted in cervical spine, particularly prominent at C5-C6 level. Soft tissues and spinal canal: Posterior bony spurs causing extrinsic pressure over the ventral margin of thecal sac at C5-C6 level with possible mild spinal stenosis at the level of body C6 vertebra. Disc levels: There is mild to moderate encroachment of neural foramina at C5-C6 level. There is mild encroachment of neural foramina at C6-C7 level. Upper chest: Unremarkable. Other: Thyroid is enlarged in size. IMPRESSION: No recent fracture  is seen in the cervical spine. Cervical spondylosis with encroachment of neural foramina at C5-C6 and C6-C7 levels, more so at C5-C6 level. Electronically  Signed   By: Ernie Avena M.D.   On: 06/09/2021 13:21    Assessment and Plan   Martin Cooper is a 36 y.o. male with medical history significant of seizure, tobacco abuse, cocaine abuse, marijuana abuse, asthma, GERD, who presents with seizure.  Seizures chronic with breakthrough episode in the setting of polysubstance drug abuse and noncompliance to medication -- resumed on Keppra and phenytoin -- patient counselled on taking antiseizure meds regularly -- no seizures reported -- seen by Dr. Amada Jupiter neurology recommends continue home dose of seizure meds  polysubstance drug abuse -- urine drug positive for cocaine marijuana -- advised to abstain  tobacco abuse -- counsel cessation  Family communication : girlfriend and mother Consults : neurology CODE STATUS: full DVT Prophylaxis : Lovenox Level of care: Med-Surg  will continue to monitor procedures. If remains seizure free will discharged tomorrow. This was related to patient girlfriend and mother wasn't in the room  social worker has given resources for substance abuse program in the community           TOTAL TIME TAKING CARE OF THIS PATIENT: 25 minutes.  >50% time spent on counselling and coordination of care  Note: This dictation was prepared with Dragon dictation along with smaller phrase technology. Any transcriptional errors that result from this process are unintentional.  Enedina Finner M.D    [facility]   CC: Primary care physician; [facility]

## 2021-06-10 NOTE — TOC Progression Note (Signed)
Transition of Care [facility]) - Progression Note    Patient Details  Name: Martin Cooper MRN: [id number] Date of Birth: May 08, 1985  Transition of Care [facility] CM/SW Contact  Maree Krabbe, LCSW Phone Number: 06/10/2021, 1:20 PM  Clinical Narrative:  Pt accepted of printed resources for Substance abuse. Substance abuse resources printed and placed with pt's ppwk.         Expected Discharge Plan and Services                                                 Social Determinants of Health (SDOH) Interventions    Readmission Risk Interventions No flowsheet data found.

## 2021-06-10 NOTE — Progress Notes (Signed)
Subjective: He reports additional seizures, speaking with the nurse there is some concern for possible nonepileptic events.    Exam: Vitals:   06/10/21 1127 06/10/21 1236  BP: 110/85 107/70  Pulse: (!) 59 (!) 59  Resp: 18 19  Temp: 98.6 F (37 C) 98.4 F (36.9 C)  SpO2: 98% 100%   Gen: In bed, NAD Resp: non-labored breathing, no acute distress Abd: soft, nt  Neuro: MS: Awake, alert, interactive and appropriate, gives the month is May CN: EOMI, visual fields full Motor: 5/5 throughout Sensory: Intact to light touch   Pertinent Labs: PHT-less than 2.5 from yesterday UDS is positive for cocaine  Impression: 36 year old male who presented with seizure flurry in the setting of medication noncompliance.  He denies cocaine use, but his UDS is positive and I suspect that this might be related to his seizure flurry.  He was still within the timeframe overnight for seizures from cocaine, and would expect that to calm down over the next day or so thus I would not make any changes to his medication regimen at the current time.  If he continues having seizures, I have asked his partner to fill them to assess for nonepileptic events.  If he continues having episodes, may need to consider transfer to an institution for further characterization.  Recommendations: 1) continue Keppra 1 g twice daily 2) continue PHT 330mg  nightly 3) Ativan 2 mg as needed for seizures lasting more than 5 minutes 4) neurology will continue to follow  , MD [facility] [phone]  If 7pm- 7am, please page neurology on call as listed in AMION.

## 2021-06-11 LAB — GLUCOSE, CAPILLARY: Glucose-Capillary: 98 mg/dL (ref 70–99)

## 2021-06-11 MED ORDER — LEVETIRACETAM 500 MG PO TABS: 1000.0000 mg | ORAL_TABLET | Freq: Two times a day (BID) | ORAL | 2 refills | Status: DC

## 2021-06-11 MED ORDER — PHENYTOIN SODIUM EXTENDED 300 MG PO CAPS: 300.0000 mg | ORAL_CAPSULE | Freq: Every day | ORAL | 2 refills | Status: DC

## 2021-06-11 MED ORDER — PHENYTOIN SODIUM EXTENDED 30 MG PO CAPS: 30.0000 mg | ORAL_CAPSULE | Freq: Every day | ORAL | 2 refills | Status: DC

## 2021-06-11 NOTE — Progress Notes (Addendum)
Patient is being discharged home.  Discharge papers given and explained to patient and family. They verbalized understanding.  Meds and f/u appointment reviewed. Rx was sent electronically to the pharmacy. Patient and family made aware.

## 2021-06-11 NOTE — Discharge Instructions (Signed)
Patient advised to abstain from using street drugs patient advised to be complying with taking his anti-seizure medicines  patient advised not to drive

## 2021-06-11 NOTE — Progress Notes (Signed)
Ativan given for seizure activity.

## 2021-06-11 NOTE — Evaluation (Signed)
Physical Therapy Evaluation Patient Details Name: Martin Cooper MRN: [id number] DOB: March 31, 1986 Today's Date: 06/11/2021  History of Present Illness  Martin Cooper is a 36 y.o. male with medical history significant of seizure, tobacco abuse, cocaine abuse, marijuana abuse, asthma, GERD, who presents with seizure.   Clinical Impression  Patient received in bed eating breakfast. He is agreeable to PT assessment, girlfriend, Edson Snowball present in room.  Patient is mod independent with bed mobility and transfer sit to stand. He ambulated 150 feet with rw and min guard. Ambulated another 20 feet without AD in room with min assist. Decreased balance without AD. He will continue to benefit from skilled PT while here to improve functional independence, safety and strength.      Recommendations for follow up therapy are one component of a multi-disciplinary discharge planning process, led by the attending physician.  Recommendations may be updated based on patient status, additional functional criteria and insurance authorization.  Follow Up Recommendations No PT follow up    Assistance Recommended at Discharge Frequent or constant Supervision/Assistance  Patient can return home with the following  A little help with bathing/dressing/bathroom;Assistance with cooking/housework;Assist for transportation;Help with stairs or ramp for entrance    Equipment Recommendations Rolling walker (2 wheels);BSC/3in1;Other (comment) (shower chair)  Recommendations for Other Services       Functional Status Assessment Patient has had a recent decline in their functional status and demonstrates the ability to make significant improvements in function in a reasonable and predictable amount of time.     Precautions / Restrictions Precautions Precautions: Fall Precaution Comments: seizure Restrictions Weight Bearing Restrictions: No      Mobility  Bed Mobility Overal bed mobility: Modified Independent                   Transfers Overall transfer level: Modified independent Equipment used: None                    Ambulation/Gait Ambulation/Gait assistance: Min guard Gait Distance (Feet): 150 Feet Assistive device: Rolling walker (2 wheels) Gait Pattern/deviations: Step-through pattern, Decreased step length - right, Decreased step length - left, Trunk flexed, Decreased stride length Gait velocity: decr     General Gait Details: patient ambulated around nurses station wth RW and min guard no lob. He ambulated in room without ad and was unsteady requiring more assist and staggering.  Stairs            Wheelchair Mobility    Modified Rankin (Stroke Patients Only)       Balance Overall balance assessment: Needs assistance, Modified Independent Sitting-balance support: Feet supported Sitting balance-Leahy Scale: Good     Standing balance support: Bilateral upper extremity supported, During functional activity, Reliant on assistive device for balance Standing balance-Leahy Scale: Fair                               Pertinent Vitals/Pain Pain Assessment Pain Assessment: No/denies pain    Home Living Family/patient expects to be discharged to:: Private residence Living Arrangements: Other relatives;Spouse/significant other Available Help at Discharge: Family;Available 24 hours/day Type of Home: House         Home Layout: One level Home Equipment: None      Prior Function Prior Level of Function : Needs assist;History of Falls (last six months)             Mobility Comments: falls ADLs Comments: able to bathe and  dress himself     Hand Dominance        Extremity/Trunk Assessment   Upper Extremity Assessment Upper Extremity Assessment: Overall WFL for tasks assessed    Lower Extremity Assessment Lower Extremity Assessment: Overall WFL for tasks assessed    Cervical / Trunk Assessment Cervical / Trunk Assessment: Normal   Communication   Communication: No difficulties  Cognition Arousal/Alertness: Awake/alert Behavior During Therapy: WFL for tasks assessed/performed Overall Cognitive Status: Within Functional Limits for tasks assessed                                          General Comments      Exercises     Assessment/Plan    PT Assessment Patient needs continued PT services  PT Problem List Decreased strength;Decreased mobility;Decreased activity tolerance;Decreased balance;Decreased knowledge of use of DME       PT Treatment Interventions DME instruction;Therapeutic exercise;Gait training;Stair training;Functional mobility training;Therapeutic activities;Patient/family education;Balance training    PT Goals (Current goals can be found in the Care Plan section)  Acute Rehab PT Goals Patient Stated Goal: to return home PT Goal Formulation: With patient/family Time For Goal Achievement: 06/19/21 Potential to Achieve Goals: Good    Frequency Min 2X/week     Co-evaluation               AM-PAC PT "6 Clicks" Mobility  Outcome Measure Help needed turning from your back to your side while in a flat bed without using bedrails?: None Help needed moving from lying on your back to sitting on the side of a flat bed without using bedrails?: None Help needed moving to and from a bed to a chair (including a wheelchair)?: A Little Help needed standing up from a chair using your arms (e.g., wheelchair or bedside chair)?: A Little Help needed to walk in hospital room?: A Little Help needed climbing 3-5 steps with a railing? : A Little 6 Click Score: 20    End of Session Equipment Utilized During Treatment: Gait belt Activity Tolerance: Patient tolerated treatment well Patient left: in bed;with call bell/phone within reach;with bed alarm set;with family/visitor present Nurse Communication: Mobility status PT Visit Diagnosis: Unsteadiness on feet (R26.81);Other abnormalities  of gait and mobility (R26.89);Difficulty in walking, not elsewhere classified (R26.2);History of falling (Z91.81)    Time: 3235-5732 PT Time Calculation (min) (ACUTE ONLY): 15 min   Charges:   PT Evaluation $PT Eval Moderate Complexity: 1 Mod          Valbona Slabach, PT, GCS 06/11/21,9:33 AM

## 2021-06-11 NOTE — Discharge Summary (Signed)
Physician Discharge Summary   Patient: Martin [place] MRN: [id number] DOB: 03-19-1986  Admit date:     06/09/2021  Discharge date: 06/11/21  Discharge Physician: Enedina Finner   PCP: [facility]   Recommendations at discharge:   follow-up Dr. Clydia Llano show and 1 to 2 weeks make sure you take antiseizure meds every day abstain from using street drugs do not drive  Discharge Diagnoses:  suspected breakthrough seizure with history of epilepsy polysubstance drug abuse noncompliance to medication Hospital Course: [name] is a 36 y.o. male with medical history significant of seizure, tobacco abuse, cocaine abuse, marijuana abuse, asthma, GERD, who presents with seizure.   Seizures chronic with breakthrough episode in the setting of polysubstance drug abuse and noncompliance to medication -- resumed on Keppra and phenytoin -- patient counselled on taking antiseizure meds regularly -- no seizures reported -- seen by Dr. Amada Jupiter neurology recommends continue home dose of seizure meds -- patient was recommended for continuous EEG monitoring by neurology. Discussed with patient and mother with girlfriend in the room. Patient declined for transfer. Will discharged to home   polysubstance drug abuse -- urine drug positive for cocaine marijuana -- advised to abstain   tobacco abuse -- counsel cessation   Family communication : girlfriend and mother Consults : neurology CODE STATUS: full DVT Prophylaxis : Lovenox Level of care: Med-Surg   discharged to home       Consultants: Neurology Procedures performed: none  Disposition: Home Diet recommendation:  Discharge Diet Orders (From admission, onward)     Start     Ordered   06/11/21 0000  Diet general        06/11/21 1257           Regular diet  DISCHARGE MEDICATION: Allergies as of 06/11/2021   No Known Allergies      Medication List     STOP taking these medications    fluticasone 50  MCG/ACT nasal spray Commonly known as: FLONASE       TAKE these medications    levETIRAcetam 500 MG tablet Commonly known as: Keppra Take 2 tablets (1,000 mg total) by mouth 2 (two) times daily.   phenytoin 30 MG ER capsule Commonly known as: Dilantin Take 1 capsule (30 mg total) by mouth at bedtime.   phenytoin 300 MG ER capsule Commonly known as: DILANTIN Take 1 capsule (300 mg total) by mouth at bedtime.        Follow-up Information     Lonell Face, MD. Call in 1 week(s).   Specialty: Neurology Why: pt to call and make f/u appt with dr Liz Malady information: [address] [place] [facility] [place] [address] [phone]                 Discharge Exam: [name] Weights   06/09/21 1110  Weight: 68 kg      Condition at discharge: fair  The results of significant diagnostics from this hospitalization (including imaging, microbiology, ancillary and laboratory) are listed below for reference.   Imaging Studies: DG Ankle Complete Right  Result Date: 05/27/2021 CLINICAL DATA:  Right ankle pain after a seizure. EXAM: RIGHT ANKLE - COMPLETE 3+ VIEW COMPARISON:  Right ankle radiographs 04/02/2021 FINDINGS: There is no evidence of fracture, dislocation, or joint effusion. There is no evidence of arthropathy or other focal bone abnormality. Soft tissues are unremarkable. IMPRESSION: Negative. Electronically Signed   By: Emmaline Kluver M.D.   On: 05/27/2021 20:30   CT  HEAD WO CONTRAST ( )  Result Date: 06/09/2021 CLINICAL DATA:  Seizure last night, amnesia head trauma EXAM: CT HEAD WITHOUT CONTRAST TECHNIQUE: Contiguous axial images were obtained from the base of the skull through the vertex without intravenous contrast. RADIATION DOSE REDUCTION: This exam was performed according to the departmental dose-optimization program which includes automated exposure control, adjustment of the mA and/or kV according to patient size and/or use  of iterative reconstruction technique. COMPARISON:  CT head 05/07/2021 FINDINGS: Brain: There is no evidence of acute intracranial hemorrhage, extra-axial fluid collection, or acute infarct. Parenchymal volume is normal. Ventricles are normal in size. Gray-white differentiation is preserved. There is no mass lesion. There is no midline shift. Vascular: No hyperdense vessel or unexpected calcification. Skull: Normal. Negative for fracture or focal lesion. Sinuses/Orbits: Imaged paranasal sinuses are clear. The imaged globes and orbits are unremarkable. Other: None. IMPRESSION: Normal head CT. Electronically Signed   By: Lesia Hausen M.D.   On: 06/09/2021 13:22   CT Cervical Spine Wo Contrast  Result Date: 06/09/2021 CLINICAL DATA:  Trauma EXAM: CT CERVICAL SPINE WITHOUT CONTRAST TECHNIQUE: Multidetector CT imaging of the cervical spine was performed without intravenous contrast. Multiplanar CT image reconstructions were also generated. RADIATION DOSE REDUCTION: This exam was performed according to the departmental dose-optimization program which includes automated exposure control, adjustment of the mA and/or kV according to patient size and/or use of iterative reconstruction technique. COMPARISON:  03/03/2021 FINDINGS: Alignment: Alignment of posterior margins of vertebral bodies is unremarkable. Skull base and vertebrae: No recent fracture is seen. Degenerative changes are noted in cervical spine, particularly prominent at C5-C6 level. Soft tissues and spinal canal: Posterior bony spurs causing extrinsic pressure over the ventral margin of thecal sac at C5-C6 level with possible mild spinal stenosis at the level of body C6 vertebra. Disc levels: There is mild to moderate encroachment of neural foramina at C5-C6 level. There is mild encroachment of neural foramina at C6-C7 level. Upper chest: Unremarkable. Other: Thyroid is enlarged in size. IMPRESSION: No recent fracture is seen in the cervical spine. Cervical  spondylosis with encroachment of neural foramina at C5-C6 and C6-C7 levels, more so at C5-C6 level. Electronically Signed   By: Ernie Avena M.D.   On: 06/09/2021 13:21    Microbiology: Results for orders placed or performed during the hospital encounter of 06/09/21  Resp Panel by RT-PCR (Flu A&B, Covid) Nasopharyngeal Swab     Status: None   Collection Time: 06/09/21 12:30 PM   Specimen: Nasopharyngeal Swab; Nasopharyngeal(NP) swabs in vial transport medium  Result Value Ref Range Status   SARS Coronavirus 2 by RT PCR NEGATIVE NEGATIVE Final    Comment: (NOTE) SARS-CoV-2 target nucleic acids are NOT DETECTED.  The SARS-CoV-2 RNA is generally detectable in upper respiratory specimens during the acute phase of infection. The lowest concentration of SARS-CoV-2 viral copies this assay can detect is 138 copies/mL. A negative result does not preclude SARS-Cov-2 infection and should not be used as the sole basis for treatment or other patient management decisions. A negative result may occur with  improper specimen collection/handling, submission of specimen other than nasopharyngeal swab, presence of viral mutation(s) within the areas targeted by this assay, and inadequate number of viral copies(<138 copies/mL). A negative result must be combined with clinical observations, patient history, and epidemiological information. The expected result is Negative.  Fact Sheet for Patients:  BloggerCourse.com  Fact Sheet for Healthcare Providers:  SeriousBroker.it  This test is no t yet approved or cleared by the  [place] [employer] and  has been authorized for detection and/or diagnosis of SARS-CoV-2 by FDA under an TEFL teacher (EUA). This EUA will remain  in effect (meaning this test can be used) for the duration of the COVID-19 declaration under Section 564(b)(1) of the Act, 21 U.S.C.section 360bbb-3(b)(1), unless the  authorization is terminated  or revoked sooner.       Influenza A by PCR NEGATIVE NEGATIVE Final   Influenza B by PCR NEGATIVE NEGATIVE Final    Comment: (NOTE) The Xpert Xpress SARS-CoV-2/FLU/RSV plus assay is intended as an aid in the diagnosis of influenza from Nasopharyngeal swab specimens and should not be used as a sole basis for treatment. Nasal washings and aspirates are unacceptable for Xpert Xpress SARS-CoV-2/FLU/RSV testing.  Fact Sheet for Patients: BloggerCourse.com  Fact Sheet for Healthcare Providers: SeriousBroker.it  This test is not yet approved or cleared by the [place] FDA and has been authorized for detection and/or diagnosis of SARS-CoV-2 by FDA under an Emergency Use Authorization (EUA). This EUA will remain in effect (meaning this test can be used) for the duration of the COVID-19 declaration under Section 564(b)(1) of the Act, 21 U.S.C. section 360bbb-3(b)(1), unless the authorization is terminated or revoked.  Performed at [facility], [address] [place], [place] [address]     Labs: CBC: Recent Labs  Lab 06/09/21 1112  WBC 5.9  HGB 14.2  HCT 43.4  MCV 91.8  PLT 298   Basic Metabolic Panel: Recent Labs  Lab 06/09/21 1112  NA 135  K 4.2  CL 105  CO2 25  GLUCOSE 96  BUN 11  CREATININE 1.14  CALCIUM 9.0  MG 1.8   Liver Function Tests: No results for input(s): AST, ALT, ALKPHOS, BILITOT, PROT, ALBUMIN in the last 168 hours. CBG: Recent Labs  Lab 06/10/21 0331 06/11/21 0851  GLUCAP 80 98    Discharge time spent: greater than 30 minutes.  Signed: Enedina Finner, MD [facility] 06/11/2021

## 2021-06-11 NOTE — Progress Notes (Signed)
This RN was called to go to room 106 d/t patient is having a seizure. Patient was laying in bed with his eyes closed and right hand shaking. He was alert and followed commands at that time.  This Clinical research associate asked if he can lay on his back so BP will be checked and he did. Than was asked if he wants to drink or to eat anything. He requested apple juice and cereal.

## 2021-06-11 NOTE — Progress Notes (Signed)
Subjective: He continues to report additional events  Exam: Vitals:   06/11/21 0846 06/11/21 1018  BP: 115/78 113/78  Pulse: 61 99  Resp: 18 18  Temp: 97.8 F (36.6 C) 98.2 F (36.8 C)  SpO2: 100% 99%   Gen: In bed, NAD Resp: non-labored breathing, no acute distress Abd: soft, nt  Neuro: MS: Awake, alert, interactive and appropriate, gives the month is May CN: EOMI, visual fields full Motor: 5/5 throughout Sensory: Intact to light touch   Pertinent Labs: PHT-16.7 UDS is positive for cocaine  Impression: 36 year old male who presented with seizure flurry in the setting of medication noncompliance.  He denies cocaine use, but his UDS is positive and I suspect that this might be related to his seizure flurry.  There have been concerns raised for the etiology of these events (possible nonorganic).  If he continues having episodes, may need to consider transfer to an institution for further characterization as I have significant concerns further etiology, and would not favor aggressively chasing them without further characterization.  Recommendations: 1) continue Keppra 1 g twice daily 2) continue PHT 330mg  nightly 3) Ativan 2 mg as needed for seizures lasting more than 5 minutes 4) I would consider transfer for LTM EEG to further characterize these events  , MD [facility] [phone]  If 7pm- 7am, please page neurology on call as listed in AMION.

## 2021-06-12 ENCOUNTER — Encounter: Payer: Self-pay | Admitting: *Deleted

## 2021-06-12 ENCOUNTER — Other Ambulatory Visit: Payer: Self-pay

## 2021-06-12 ENCOUNTER — Emergency Department
Admission: EM | Admit: 2021-06-12 | Discharge: 2021-06-12 | Disposition: A | Discharge status: Home / Self Care | Payer: Medicaid Other | Attending: Emergency Medicine | Admitting: Emergency Medicine

## 2021-06-12 ENCOUNTER — Emergency Department: Payer: Medicaid Other

## 2021-06-12 DIAGNOSIS — J45909 Unspecified asthma, uncomplicated: Secondary | ICD-10-CM | POA: Diagnosis not present

## 2021-06-12 DIAGNOSIS — R519 Headache, unspecified: Secondary | ICD-10-CM | POA: Diagnosis not present

## 2021-06-12 DIAGNOSIS — R569 Unspecified convulsions: Secondary | ICD-10-CM

## 2021-06-12 LAB — CBC WITH DIFFERENTIAL/PLATELET
Abs Immature Granulocytes: 0.01 10*3/uL (ref 0.00–0.07)
Basophils Absolute: 0.1 10*3/uL (ref 0.0–0.1)
Basophils Relative: 1 %
Eosinophils Absolute: 0.2 10*3/uL (ref 0.0–0.5)
Eosinophils Relative: 3 %
HCT: 43.1 % (ref 39.0–52.0)
Hemoglobin: 14.1 g/dL (ref 13.0–17.0)
Immature Granulocytes: 0 %
Lymphocytes Relative: 34 %
Lymphs Abs: 1.9 10*3/uL (ref 0.7–4.0)
MCH: 29.9 pg (ref 26.0–34.0)
MCHC: 32.7 g/dL (ref 30.0–36.0)
MCV: 91.5 fL (ref 80.0–100.0)
Monocytes Absolute: 0.6 10*3/uL (ref 0.1–1.0)
Monocytes Relative: 10 %
Neutro Abs: 2.9 10*3/uL (ref 1.7–7.7)
Neutrophils Relative %: 52 %
Platelets: 277 10*3/uL (ref 150–400)
RBC: 4.71 MIL/uL (ref 4.22–5.81)
RDW: 12 % (ref 11.5–15.5)
WBC: 5.5 10*3/uL (ref 4.0–10.5)
nRBC: 0 % (ref 0.0–0.2)

## 2021-06-12 LAB — URINALYSIS, COMPLETE (UACMP) WITH MICROSCOPIC
Bilirubin Urine: NEGATIVE
Glucose, UA: NEGATIVE mg/dL
Hgb urine dipstick: NEGATIVE
Ketones, ur: NEGATIVE mg/dL
Leukocytes,Ua: NEGATIVE
Nitrite: NEGATIVE
Protein, ur: NEGATIVE mg/dL
Specific Gravity, Urine: 1.026 (ref 1.005–1.030)
pH: 5 (ref 5.0–8.0)

## 2021-06-12 LAB — URINE DRUG SCREEN, QUALITATIVE (ARMC ONLY)
Amphetamines, Ur Screen: NOT DETECTED
Barbiturates, Ur Screen: NOT DETECTED
Benzodiazepine, Ur Scrn: POSITIVE — AB
Cannabinoid 50 Ng, Ur ~~LOC~~: POSITIVE — AB
Cocaine Metabolite,Ur ~~LOC~~: POSITIVE — AB
MDMA (Ecstasy)Ur Screen: NOT DETECTED
Methadone Scn, Ur: NOT DETECTED
Opiate, Ur Screen: NOT DETECTED
Phencyclidine (PCP) Ur S: NOT DETECTED
Tricyclic, Ur Screen: NOT DETECTED

## 2021-06-12 LAB — COMPREHENSIVE METABOLIC PANEL
ALT: 14 U/L (ref 0–44)
AST: 20 U/L (ref 15–41)
Albumin: 4 g/dL (ref 3.5–5.0)
Alkaline Phosphatase: 66 U/L (ref 38–126)
Anion gap: 9 (ref 5–15)
BUN: 9 mg/dL (ref 6–20)
CO2: 29 mmol/L (ref 22–32)
Calcium: 9.3 mg/dL (ref 8.9–10.3)
Chloride: 100 mmol/L (ref 98–111)
Creatinine, Ser: 1.33 mg/dL — ABNORMAL HIGH (ref 0.61–1.24)
GFR, Estimated: >60 mL/min (ref >60)
Glucose, Bld: 98 mg/dL (ref 70–99)
Potassium: 3.8 mmol/L (ref 3.5–5.1)
Sodium: 138 mmol/L (ref 135–145)
Total Bilirubin: 0.3 mg/dL (ref 0.3–1.2)
Total Protein: 7.7 g/dL (ref 6.5–8.1)

## 2021-06-12 LAB — PHENYTOIN LEVEL, TOTAL: Phenytoin Lvl: 14.2 ug/mL (ref 10.0–20.0)

## 2021-06-12 MED ORDER — HYDROCODONE-ACETAMINOPHEN 5-325 MG PO TABS
1.0000 | ORAL_TABLET | Freq: Once | ORAL | Status: AC
  Administered 2021-06-12: 1 via ORAL
  Filled 2021-06-12: qty 1

## 2021-06-12 MED ORDER — PHENYTOIN SODIUM EXTENDED 100 MG PO CAPS
300.0000 mg | ORAL_CAPSULE | Freq: Once | ORAL | Status: AC
  Administered 2021-06-12: 300 mg via ORAL
  Filled 2021-06-12: qty 3

## 2021-06-12 MED ORDER — LEVETIRACETAM IN NACL 1000 MG/100ML IV SOLN
1000.0000 mg | Freq: Once | INTRAVENOUS | Status: AC
  Administered 2021-06-12: 1000 mg via INTRAVENOUS
  Filled 2021-06-12: qty 100

## 2021-06-12 NOTE — ED Triage Notes (Addendum)
BIB [facility] for sz-like activity at home, recurrent. D/c'd yesterday from hospital for sz admission. Has not had meds since d/c. Suppose to take keppra and dilantin. Limited responses with EMS. EMS reports "no sz activity". Pt answesrs some questions and primarily moans. Some shaking of RUE & RLE on arrival. Alert, NAD, calm, moaning, answering questions, following commands. ETOH bottles noted at the house. CBG 101. VSS. No incontinence or oral injury. Family at home verbalized unable to get meds til Wednesday. Mother called during triage process, and said she was going to get meds now.

## 2021-06-12 NOTE — ED Provider Notes (Signed)
[facility] Provider Note    Event Date/Time   First MD Initiated Contact with Patient 06/12/21 [phone]     (approximate)  History   Chief Complaint: Seizures  HPI  [place] is a 36 y.o. male with a past medical history of asthma, seizure disorder who presents to the emergency department for seizure activity.  Per report patient was discharged yesterday from the hospital after he had a seizure.  Has not taking any medications since discharge.  Spouse to be on Keppra and Dilantin.  Patient is complaining of a headache in the emergency department otherwise he is awake alert.  EMS reports that there are several alcohol bottles at the house.  Nurse states that the mother has called and says she is going to pick up the patient's medications this morning.  Physical Exam   Triage Vital Signs: ED Triage Vitals  Enc Vitals Group     BP 06/12/21 0910 129/84     Pulse Rate 06/12/21 0910 (!) 59     Resp 06/12/21 0910 18     Temp 06/12/21 0910 98.4 F (36.9 C)     Temp Source 06/12/21 0910 Oral     SpO2 06/12/21 0907 100 %     Weight 06/12/21 0913 150 lb (68 kg)     Height 06/12/21 0913 6' (1.829 m)     Head Circumference --      Peak Flow --      Pain Score 06/12/21 1007 10     Pain Loc --      Pain Edu? --      Excl. in GC? --     Most recent vital signs: Vitals:   06/12/21 0950 06/12/21 1009  BP:  108/77  Pulse: 62 61  Resp: 19 14  Temp:    SpO2: 98% 99%    General: Awake, no distress.  CV:  Good peripheral perfusion.  Regular rate and rhythm  Resp:  Normal effort.  Equal breath sounds bilaterally.  Abd:  No distention.  Soft, nontender.  No rebound or guarding.    ED Results / Procedures / Treatments   EKG  EKG viewed and interpreted by myself shows a normal sinus rhythm at 62 bpm with a narrow QRS, normal axis, normal intervals, nonspecific ST changes but no significant ST elevation or reciprocal depressions.  RADIOLOGY  I personally  reviewed the CT images no acute concerning finding my evaluation. Radiology is read the CT scan is negative.   MEDICATIONS ORDERED IN ED: Medications  HYDROcodone-acetaminophen (NORCO/VICODIN) 5-325 MG per tablet 1 tablet (1 tablet Oral Given 06/12/21 1007)     IMPRESSION / MDM / ASSESSMENT AND PLAN / ED COURSE  I reviewed the triage vital signs and the nursing notes.  Patient presents emergency department after seizure-like activity.  Patient's only complaint is a headache.  I reviewed the patient's records, patient was discharged from the hospital yesterday around 1 PM.  Patient was noted to have a breakthrough seizure with a documented history of epilepsy as well as polysubstance drug abuse and noncompliance to his medications.  During the admission patient was seen by neurology who recommended the patient continue his home Keppra and phenytoin.  Patient declined EEG during admission.  Urine drug screen was positive for cocaine and marijuana.  Today patient states he thinks he had a seizure this morning.  Patient has not taken any of his seizure medications since being discharged from the hospital yesterday.  Mom stated  she will pick up the medications this morning.  We will go ahead and load the patient with Keppra as well as dose his typical phenytoin oral dose.  We will check labs.  Given the patient's report of a headache we will obtain CT imaging of the head and continue to closely monitor while awaiting family arrival.  I was called to the room for seizure-like activity.  Patient was shaking his arms and leg and had some drool coming down his face.  However I was talking to the patient he was answering my questions appropriately.  When asked if he needed anything else he stopped shaking and said he was hungry and asked for food.  Do not believe the seizure-like activity that eyewitness was consistent with a seizure.  Patient has received phenytoin he has been loaded with Keppra.  Patient's  significant other is here now with the patient states they are picking up his medications after they leave the hospital today.  Patient continues to appear well.  Patient's lab work has resulted showing a normal chemistry.  CBC is reassuring as well.  Urinalysis shows no concerning findings as well.  Patient's reassuring work-up I believe the patient is safe for discharge home.  Significant other is here with states they will pick up the prescriptions on the way home.  FINAL CLINICAL IMPRESSION(S) / ED DIAGNOSES   Seizure  Rx / DC Orders   Patient is to follow-up with neurology Patient is to continue taking his home phenytoin and Keppra as prescribed previously.  Note:  This document was prepared using Dragon voice recognition software and may include unintentional dictation errors.   Minna Antis, MD 06/12/21 1239

## 2021-06-12 NOTE — ED Notes (Signed)
EDP at BS 

## 2021-06-12 NOTE — ED Notes (Addendum)
EDP at [facility]. Pt c/o hunger. Meal ordered.

## 2021-06-12 NOTE — ED Notes (Signed)
Back from CT, alert, NAD, calm, interactive.  

## 2021-06-12 NOTE — TOC Initial Note (Signed)
Transition of Care [facility]) - Initial/Assessment Note    Patient Details  Name: Martin [place] MRN: [id number] Date of Birth: [birth year]  [facility] CM/SW Contact:    Allayne Butcher, RN Phone Number: 06/12/2021, 1:13 PM  Clinical Narrative:                 Patient in the emergency room for seizure, he does not need to be admitted, he was discharged from the hospital yesterday- he still needs to pick up his seizure medication from [facility] today, girlfriend at the bedside reports that he was out of his medications for about 2 weeks.  Patient requests a rollator because he falls a lot at home especially in the bathroom.   He lives with his girlfriend, who is at the bedside today.  She reports that they will be moving soon probably back to [place] where they were before. Rollator ordered from [employer] and will be delivered to the patient's room.   Clarified with MD that patient should be taking 330mg  of dilantin at night.    Expected Discharge Plan: Home/Self Care Barriers to Discharge: ED DME delivery   Patient Goals and CMS Choice Patient states their goals for this hospitalization and ongoing recovery are:: patient would like a rollator because he falls alot at home CMS Medicare.gov Compare Post Acute Care list provided to:: Patient Choice offered to / list presented to : Patient  Expected Discharge Plan and Services Expected Discharge Plan: Home/Self Care   Discharge Planning Services: CM Consult Post Acute Care Choice: Durable Medical Equipment Living arrangements for the past 2 months: Single Family Home                 DME Arranged: Walker rolling with seat DME Agency: [employer] Date DME Agency Contacted: 06/12/21 Time DME Agency Contacted: 1312 Representative spoke with at DME Agency: 06/14/21 HH Arranged: NA HH Agency: NA        Prior Living Arrangements/Services Living arrangements for the past 2 months: Single Family Home Lives with:: Significant  Other Patient language and need for interpreter reviewed:: Yes Do you feel safe going back to the place where you live?: Yes      Need for Family Participation in Patient Care: Yes (Comment) (seizures) Care giver support system in place?: Yes (comment) (girlfriend)   Criminal Activity/Legal Involvement Pertinent to Current Situation/Hospitalization: No - Comment as needed  Activities of Daily Living      Permission Sought/Granted Permission sought to share information with : Other (comment) Permission granted to share information with : Yes, Verbal Permission Granted     Permission granted to share info w AGENCY: [employer] for DME        Emotional Assessment Appearance:: Appears stated age Attitude/Demeanor/Rapport: Engaged Affect (typically observed): Accepting Orientation: : Oriented to Self, Oriented to Place, Oriented to  Time, Oriented to Situation Alcohol / Substance Use: Illicit Drugs Psych Involvement: No (comment)  Admission diagnosis:  SZ, EMS Patient Active Problem List   Diagnosis Date Noted   Seizures (HCC) 06/10/2021   Seizure (HCC) 06/09/2021   Asthma 06/09/2021   Tobacco abuse 06/09/2021   GERD (gastroesophageal reflux disease) 06/09/2021   Polysubstance abuse (HCC) 06/09/2021   [facility]:   [facility] [address] [place] Phone: [phone] Fax: [phone]     Social Determinants of Health (SDOH) Interventions    Readmission Risk Interventions No flowsheet data found.

## 2021-06-12 NOTE — ED Notes (Signed)
Pt's family member came out states pt is having another seizure. This RN rushed in the room, pt's having shaking movement while lying on his left side. This nurse attempted to adjust pt's pillow to prevent pt from hitting his head on the side rail, pt voluntarily moved his head and positioned himself while his eyes are closed, shaking, with frothy sputum. Dr. Lenard Lance at the bedside. MD started talking to pt and pt responded to MD's questions while having shaky movements.

## 2021-06-12 NOTE — ED Notes (Signed)
Pt requesting a walker. States, he "needs a walker at home b/c he falls in the b/r".

## 2021-06-12 NOTE — ED Notes (Signed)
SW at BS

## 2021-06-12 NOTE — TOC Progression Note (Signed)
Transition of Care [facility]) - Progression Note    Patient Details  Name: [place] MRN: [id number] Date of Birth: 1985-09-04  [facility] CM/SW Contact  Allayne Butcher, RN Phone Number: 06/12/2021, 1:50 PM  Clinical Narrative:    Patient's out of state Medicaid will not cover a Rollator.  RNCM will provide patient with a charity RW.     Expected Discharge Plan: Home/Self Care Barriers to Discharge: ED DME delivery  Expected Discharge Plan and Services Expected Discharge Plan: Home/Self Care   Discharge Planning Services: CM Consult Post Acute Care Choice: Durable Medical Equipment Living arrangements for the past 2 months: Single Family Home                 DME Arranged: Walker rolling with seat DME Agency: [employer] Date DME Agency Contacted: 06/12/21 Time DME Agency Contacted: 1312 Representative spoke with at DME Agency: Duwayne Heck HH Arranged: NA HH Agency: NA         Social Determinants of Health (SDOH) Interventions    Readmission Risk Interventions No flowsheet data found.

## 2021-06-12 NOTE — Discharge Instructions (Addendum)
As we discussed please take a dose of your Keppra tonight.  Beginning tomorrow dose Keppra once in the morning, once in the evening and take phenytoin at night as prescribed by your doctor.  Return to the emergency department for any symptoms personally concerning to yourself.

## 2021-06-12 NOTE — ED Notes (Signed)
Given apple juice per request. Watching/ using cell phone actively.

## 2021-06-12 NOTE — ED Notes (Signed)
Walker given, out to family in w/r

## 2021-06-13 LAB — LEVETIRACETAM LEVEL: Levetiracetam Lvl: 8.8 ug/mL — ABNORMAL LOW (ref 10.0–40.0)

## 2021-06-19 ENCOUNTER — Other Ambulatory Visit: Payer: Self-pay

## 2021-06-20 ENCOUNTER — Other Ambulatory Visit: Payer: Self-pay

## 2021-06-28 ENCOUNTER — Other Ambulatory Visit: Payer: Self-pay

## 2021-07-20 ENCOUNTER — Other Ambulatory Visit: Payer: Self-pay

## 2021-07-24 ENCOUNTER — Telehealth: Payer: Self-pay | Admitting: Pharmacist

## 2021-07-24 NOTE — Telephone Encounter (Signed)
Patient failed to provide requested 2023 financial documentation. No additional medication assistance will be provided by [facility] without the required proof of income documentation. Patient notified by letter. ?Debra Cheek ?Administrative Assistant ?[facility] ? ?

## 2021-07-26 ENCOUNTER — Other Ambulatory Visit: Payer: Self-pay

## 2021-08-01 ENCOUNTER — Other Ambulatory Visit: Payer: Self-pay

## 2021-08-01 ENCOUNTER — Emergency Department (HOSPITAL_COMMUNITY)
Admission: EM | Admit: 2021-08-01 | Discharge: 2021-08-01 | Disposition: A | Discharge status: Home / Self Care | Payer: Medicaid Other | Attending: Emergency Medicine | Admitting: Emergency Medicine

## 2021-08-01 ENCOUNTER — Encounter (HOSPITAL_COMMUNITY): Payer: Self-pay | Admitting: Emergency Medicine

## 2021-08-01 DIAGNOSIS — G40909 Epilepsy, unspecified, not intractable, without status epilepticus: Secondary | ICD-10-CM | POA: Diagnosis not present

## 2021-08-01 DIAGNOSIS — R569 Unspecified convulsions: Secondary | ICD-10-CM

## 2021-08-01 LAB — COMPREHENSIVE METABOLIC PANEL
ALT: 18 U/L (ref 0–44)
AST: 23 U/L (ref 15–41)
Albumin: 4.7 g/dL (ref 3.5–5.0)
Alkaline Phosphatase: 82 U/L (ref 38–126)
Anion gap: 9 (ref 5–15)
BUN: 9 mg/dL (ref 6–20)
CO2: 26 mmol/L (ref 22–32)
Calcium: 9.8 mg/dL (ref 8.9–10.3)
Chloride: 106 mmol/L (ref 98–111)
Creatinine, Ser: 1.09 mg/dL (ref 0.61–1.24)
GFR, Estimated: >60 mL/min (ref >60)
Glucose, Bld: 108 mg/dL — ABNORMAL HIGH (ref 70–99)
Potassium: 3.7 mmol/L (ref 3.5–5.1)
Sodium: 141 mmol/L (ref 135–145)
Total Bilirubin: 1.1 mg/dL (ref 0.3–1.2)
Total Protein: 8.7 g/dL — ABNORMAL HIGH (ref 6.5–8.1)

## 2021-08-01 LAB — CBC WITH DIFFERENTIAL/PLATELET
Abs Immature Granulocytes: 0.01 10*3/uL (ref 0.00–0.07)
Basophils Absolute: 0.1 10*3/uL (ref 0.0–0.1)
Basophils Relative: 1 %
Eosinophils Absolute: 0 10*3/uL (ref 0.0–0.5)
Eosinophils Relative: 0 %
HCT: 43.8 % (ref 39.0–52.0)
Hemoglobin: 14.4 g/dL (ref 13.0–17.0)
Immature Granulocytes: 0 %
Lymphocytes Relative: 29 %
Lymphs Abs: 2 10*3/uL (ref 0.7–4.0)
MCH: 30.4 pg (ref 26.0–34.0)
MCHC: 32.9 g/dL (ref 30.0–36.0)
MCV: 92.6 fL (ref 80.0–100.0)
Monocytes Absolute: 0.5 10*3/uL (ref 0.1–1.0)
Monocytes Relative: 7 %
Neutro Abs: 4.3 10*3/uL (ref 1.7–7.7)
Neutrophils Relative %: 63 %
Platelets: 307 10*3/uL (ref 150–400)
RBC: 4.73 MIL/uL (ref 4.22–5.81)
RDW: 12.4 % (ref 11.5–15.5)
WBC: 6.9 10*3/uL (ref 4.0–10.5)
nRBC: 0 % (ref 0.0–0.2)

## 2021-08-01 MED ORDER — LEVETIRACETAM IN NACL 1000 MG/100ML IV SOLN
1000.0000 mg | Freq: Once | INTRAVENOUS | Status: AC
  Administered 2021-08-01: 1000 mg via INTRAVENOUS
  Filled 2021-08-01: qty 100

## 2021-08-01 MED ORDER — SODIUM CHLORIDE 0.9 % IV SOLN
1225.0000 mg | Freq: Once | INTRAVENOUS | Status: AC
  Administered 2021-08-01: 1225 mg via INTRAVENOUS
  Filled 2021-08-01: qty 24.5

## 2021-08-01 MED ORDER — SODIUM CHLORIDE 0.9 % IV SOLN
150.0000 mg | Freq: Three times a day (TID) | INTRAVENOUS | Status: DC
  Filled 2021-08-01 (×3): qty 3

## 2021-08-01 NOTE — ED Notes (Signed)
Registration called stating pt was seizing. In waiting room pt's right arm was shaking and when I called his name he would moan huh. Ammonia toilette was placed under his nose and he opened his eyes.  ?

## 2021-08-01 NOTE — Discharge Instructions (Signed)
Make sure you pick up your medications and start taking them. ?As per your request, resources in the community provided. ? ?Substance Abuse Treatment Programs ? ?Intensive Outpatient Programs ?[facility]     ?[address]      ?[place], [place]                   ?[phone]      ? ?[facility] ?[address] ?[place], [place] ?[phone] ? ?[facility]     ?(Inpatient and outpatient)     ?[address]           ?[phone]   ? ?[facility] ?[phone] (Suboxone and Methadone) ? ?[address], [place] [address]      ?[phone]      ? ?[address] ?[place], [place] ?[phone] ? ?[facility] (Outpatient/Inpatient, [facility])    ?(insurance only) [phone]      ?       ?[facility] (Groups & Residential) ?[place] [employer], [place] ?[phone] ? ?   ?[facility]     ?[address]     ?[place], [place]      ?[phone]      ? ?[facility] (for caregivers and family) ?[address] ?[place], [place] ?[phone] ? ? ? ? ? ?Residential Treatment Programs ?[facility]      ?[address] [place], [place] [address]  ?[phone]      ? ?[facility] ?[address], [place] [address] ?[phone] ? ?[facility]        ?[phone]      ? ?[facility] ?[phone] ? ?[facility]             ?[address] [employer] [employer]                                         ?[place], [place]                                                ?[phone] or [phone]                              ? ?[facility] ?[address] ?[place], [address] ?[phone] ? ?[facility]    ?[address]      ?[place], [place]     ?[phone]      ? ?[facility] ?[address] ?[place], [place] ?[phone] ? ?[facility]   ?[address]     ?[place], [place]  [address]     ?[phone]      ?Admissions: 8am-3pm M-F ? ?[facility] ([facility] ?[address] ?[place], [place] ?[phone] ? ?[facility]: Residential Program (90 Days)   ?[place], [place]      ?[phone] or [phone]    ? ?[facility] ?[place], [place] ?(Walk in Hours over the weekend or by referral) ? ?[facility] ?[address] [place], [place], [place] [address] ?[phone] ? ?Crisis Mobile: [facility]:  [phone] (for crisis response 24  hours a day) ?[facility] Hotline:      [phone] ?Outpatient Psychiatry and Counseling ? ?[facility]: Mobile Crisis Management 24 hours:  [phone] ? ?[facility] [employer] sliding scale fee and walk in schedule: M-F 8am-12pm/1pm-3pm ?[address]  ?[place], [place] [address] ?[phone] ? ?[facility] ?[address] ?[place], [place] [address] ?[phone] ? ?[facility] new patient walk-in appointments available Monday - Friday 8am -3pm.          ?[address] ?[place], [place] [address] ?[phone] or crisis line- [phone] ? ?[facility]/ Intensive Outpatient Therapy Program ?[address] ?[place], [place] [address] ?[phone] ? ?[facility]                  ?Crisis Services      ?[phone]      ?[address]     ?[place], [place] [address]                ? ?[facility]   ?[place] [facility] ?[phone] ?[address] ?[place], [place] [address] ? ? ?[facility]          ?[address]  ?[place], [place] [address]       ?[phone] ? ?[facility] ?[address] ?[place], [place] [address] ?[phone] ? ?[facility]    ?[address]    ?[place], [place] [address]     ?[phone]      ? ?Andee Poles,  MD     ?[phone]     ?[place] [address]     ?[phone]     ?  ?[facility] ?[address] ?[place] [address] ? ?[facility]     ?[address]     ?[place], [place]      ?[phone]      ? ?[facility] ?Eulogio Ditch, MD ?[address] ?[place], [place] [address] ?[phone] ? ?[facility]     ?[address] [phone]     ?[place], [place] [address]     ?[phone]      ? ?[facility] ?[address] [place], [place] [address] ?[phone] ?Resources for Temporary Residential Assistance/Crisis Centers ? ?[facility] CENT[facility] ?[facility]) ?M-F 8am-3pm   ?[address] [place], [place] [address]   [phone] ?Services include: laundry, barbering, support groups, case management, phone  & computer access, showers, AA/NA mtgs, mental health/substance abuse nurse, job skills class, disability information, VA assistance, spiritual classes, etc.  ? ?[facility] ? ?[employer]     ?[facility]   ?[address] [place]     ?[phone]       ?       ?[facility] (women and children)       ?[address] [place], [place] [address] ?[phone] ?[email or web address] for application and process ?Application Required ? ?[facility]   ?[address]    ?[place] [address]     ?[phone]       ?             ?[facility] ?[address] ?[place], [place] [address] ?[phone] ?[phone](EBRAXENM application appt.) ?Application Required ? ?[facility] (women only)    ?  [address]     ?[place], [place] [address]     ?[phone]      ?Intake starts 6pm daily ?Need valid ID, SSC, & Police report ?Holiday representative [employer] ?[address] ?[place], [place] ?[phone] ?Application Required ? ?[facility] (men only)     ?[address]      [place],  [place]     ?[phone]      ? ?[facility] ?(Pregnant women only) ?[address] [place], [place] ?[phone] ? ?[facility]      ?[address]      [place], [place] [address]     ?[phone]      ?       ?[facility] ?[address] ?[place], [place] ?[phone] ?90 day commitment/SA/Application process ? ?[facility](men only)     ?[address]     ?[place], [place]     ?[phone]       ?Check-in at 7pm     ?       ?[facility] ?[address] ?[place], [place] [address] ?[phone] ?Men/Women/Women and Children must be there by 7 pm ? ?Holiday representative ?[place], [place] ?[phone]                ? ?

## 2021-08-01 NOTE — ED Provider Notes (Signed)
?[facility] ?Provider Note ? ? ?CSN: [id number] ?Arrival date & time: 08/01/21  6803 ? ?  ? ?History ? ?Chief Complaint  ?Patient presents with  ? Seizures  ? ? ?Martin Cooper is a 36 y.o. male. ? ?HPI ? ?  ? ?36 year old male with history of seizures comes in with chief complaint of seizures. ?Patient states that he was visiting his girlfriend upstairs, and had multiple seizures while there.  He thinks he had at least 5 seizures.  He is requesting that he stay in the hospital with his girlfriend. ? ?Patient has known history of seizure disorder.  He was last seen in the [facility] system in February.  Patient states that he has not been taking his medications, as he never picked up the prescriptions.  He gets seizures frequently and regularly. ? ?Home Medications ?Prior to Admission medications   ?Medication Sig Start Date End Date Taking? Authorizing Provider  ?levETIRAcetam (KEPPRA) 500 MG tablet Take 2 tablets (1,000 mg total) by mouth 2 (two) times daily. ?Patient not taking: Reported on 08/01/2021 06/11/21 08/10/21  Enedina Finner, MD  ?phenytoin (DILANTIN) 30 MG ER capsule Take 1 capsule (30 mg total) by mouth at bedtime. ?Patient not taking: Reported on 08/01/2021 06/11/21   Enedina Finner, MD  ?phenytoin (DILANTIN) 300 MG ER capsule Take 1 capsule (300 mg total) by mouth at bedtime. ?Patient not taking: Reported on 08/01/2021 06/11/21 08/10/21  Enedina Finner, MD  ?   ? ?Allergies    ?Patient has no known allergies.   ? ?Review of Systems   ?Review of Systems  ?All other systems reviewed and are negative. ? ?Physical Exam ?Updated Vital Signs ?BP (!) 140/117   Pulse 63   Temp 98.5 ?F (36.9 ?C)   Resp 16   Ht 6' (1.829 m)   Wt 81.6 kg   SpO2 97%   BMI 24.41 kg/m?  ?Physical Exam ?Vitals and nursing note reviewed.  ?Constitutional:   ?   Appearance: He is well-developed.  ?HENT:  ?   Head: Atraumatic.  ?   Mouth/Throat:  ?   Comments: No evidence of tongue biting ?Eyes:  ?   Extraocular Movements:  Extraocular movements intact.  ?   Pupils: Pupils are equal, round, and reactive to light.  ?Cardiovascular:  ?   Rate and Rhythm: Normal rate.  ?Pulmonary:  ?   Effort: Pulmonary effort is normal.  ?Musculoskeletal:  ?   Cervical back: Neck supple.  ?Skin: ?   General: Skin is warm.  ?Neurological:  ?   Mental Status: He is alert and oriented to person, place, and time.  ? ? ?ED Results / Procedures / Treatments   ?Labs ?(all labs ordered are listed, but only abnormal results are displayed) ?Labs Reviewed  ?COMPREHENSIVE METABOLIC PANEL - Abnormal; Notable for the following components:  ?    Result Value  ? Glucose, Bld 108 (*)   ? Total Protein 8.7 (*)   ? All other components within normal limits  ?CBC WITH DIFFERENTIAL/PLATELET  ?PHENYTOIN LEVEL, FREE AND TOTAL  ? ? ?EKG ?None ? ?Radiology ?No results found. ? ?Procedures ?Procedures  ? ? ?Medications Ordered in ED ?Medications  ?phenytoin (DILANTIN) 1,225 mg in sodium chloride 0.9 % 250 mL IVPB (has no administration in time range)  ?levETIRAcetam (KEPPRA) IVPB 1000 mg/100 mL premix (0 mg Intravenous Stopped 08/01/21 0941)  ? ? ?ED Course/ Medical Decision Making/ A&P ?  ?                        ?  Medical Decision Making ?Amount and/or Complexity of Data Reviewed ?Labs: ordered. ? ?Risk ?Prescription drug management. ? ? ?This patient presents to the ED with chief complaint(s) of seizure-like activity with pertinent past medical history of seizure disorder, polysubstance use which further complicates the presenting complaint. The complaint involves an extensive differential diagnosis and treatment options and also carries with it a high risk of complications and morbidity.   ? ?I reviewed neurology notes from his admission in February.  I also reviewed the EDP note from 2-27. ? ?The nursing staff while patient was admitted was concerned about nonepileptic seizures.  It does not appear that he received EEG at that time. ? ?It appears that the EDP with [place]  epileptic seizure while the patient was in the ER on 2-27. ? ?Patient currently at [facility], ER as he is visiting his girlfriend who was hospitalized.  Indicates that he still is using alcohol.  He is not taking his medications, as he does not have them.  He states that he had at least 5 episodes prior to coming down to the ER, and 5 episodes while in the waiting room.  No nursing documentation indicative of 5 episodes while in the ER.  He is also requesting food, admission to the hospital so that he can feel safe and be with his girlfriend. ? ?Patient has been monitored in the ER for at least 2 hours now.  He has not had any concerning episodes here.  Blood work-up is reassuring.  No trauma appreciated.  No reversible cause for seizures.  Noncompliance likely contributing.  New stress, substance use also contributing. ? ?We will give him loading dose for both Keppra and Dilantin.  Anticipate discharge at this time. ? ? ?Additional history obtained: ?Records reviewed previous admission documents ? ?Reassessment: ?Patient observed in the ED for over 4 hours.  No repeat seizures while in the ER.  Recommended that he pick up his prescription medications, follow-up with his neurologist and refrain from substance use. ? ? ?Final Clinical Impression(s) / ED Diagnoses ?Final diagnoses:  ?Seizure-like activity (HCC)  ? ? ?Rx / DC Orders ?ED Discharge Orders   ? ? None  ? ?  ? ? ?  ?Derwood Kaplan, MD ?08/02/21 [phone] ? ?

## 2021-08-01 NOTE — ED Triage Notes (Signed)
Pt was staying with visitor upstairs and per staff from floor has had multiple seizures tonight. Pt's right arm is shaking in triage.  ?

## 2021-08-02 ENCOUNTER — Other Ambulatory Visit: Payer: Self-pay

## 2021-08-02 ENCOUNTER — Encounter: Payer: Self-pay | Admitting: Radiology

## 2021-08-02 ENCOUNTER — Emergency Department
Admission: EM | Admit: 2021-08-02 | Discharge: 2021-08-02 | Disposition: A | Discharge status: Home / Self Care | Payer: Medicaid Other | Attending: Emergency Medicine | Admitting: Emergency Medicine

## 2021-08-02 ENCOUNTER — Emergency Department: Payer: Medicaid Other

## 2021-08-02 DIAGNOSIS — Z20822 Contact with and (suspected) exposure to covid-19: Secondary | ICD-10-CM | POA: Diagnosis not present

## 2021-08-02 DIAGNOSIS — J40 Bronchitis, not specified as acute or chronic: Secondary | ICD-10-CM | POA: Insufficient documentation

## 2021-08-02 DIAGNOSIS — F1910 Other psychoactive substance abuse, uncomplicated: Secondary | ICD-10-CM | POA: Diagnosis not present

## 2021-08-02 DIAGNOSIS — R569 Unspecified convulsions: Secondary | ICD-10-CM | POA: Insufficient documentation

## 2021-08-02 LAB — URINE DRUG SCREEN, QUALITATIVE (ARMC ONLY)
Amphetamines, Ur Screen: POSITIVE — AB
Barbiturates, Ur Screen: NOT DETECTED
Benzodiazepine, Ur Scrn: NOT DETECTED
Cannabinoid 50 Ng, Ur ~~LOC~~: POSITIVE — AB
Cocaine Metabolite,Ur ~~LOC~~: POSITIVE — AB
MDMA (Ecstasy)Ur Screen: NOT DETECTED
Methadone Scn, Ur: NOT DETECTED
Opiate, Ur Screen: NOT DETECTED
Phencyclidine (PCP) Ur S: NOT DETECTED
Tricyclic, Ur Screen: NOT DETECTED

## 2021-08-02 LAB — CBC WITH DIFFERENTIAL/PLATELET
Abs Immature Granulocytes: 0.04 10*3/uL (ref 0.00–0.07)
Basophils Absolute: 0.1 10*3/uL (ref 0.0–0.1)
Basophils Relative: 1 %
Eosinophils Absolute: 0.1 10*3/uL (ref 0.0–0.5)
Eosinophils Relative: 1 %
HCT: 41.2 % (ref 39.0–52.0)
Hemoglobin: 13.4 g/dL (ref 13.0–17.0)
Immature Granulocytes: 1 %
Lymphocytes Relative: 43 %
Lymphs Abs: 2.9 10*3/uL (ref 0.7–4.0)
MCH: 30.2 pg (ref 26.0–34.0)
MCHC: 32.5 g/dL (ref 30.0–36.0)
MCV: 92.8 fL (ref 80.0–100.0)
Monocytes Absolute: 0.8 10*3/uL (ref 0.1–1.0)
Monocytes Relative: 12 %
Neutro Abs: 2.9 10*3/uL (ref 1.7–7.7)
Neutrophils Relative %: 42 %
Platelets: 318 10*3/uL (ref 150–400)
RBC: 4.44 MIL/uL (ref 4.22–5.81)
RDW: 12.2 % (ref 11.5–15.5)
WBC: 6.8 10*3/uL (ref 4.0–10.5)
nRBC: 0 % (ref 0.0–0.2)

## 2021-08-02 LAB — COMPREHENSIVE METABOLIC PANEL
ALT: 11 U/L (ref 0–44)
AST: 29 U/L (ref 15–41)
Albumin: 4.1 g/dL (ref 3.5–5.0)
Alkaline Phosphatase: 73 U/L (ref 38–126)
Anion gap: 5 (ref 5–15)
BUN: 11 mg/dL (ref 6–20)
CO2: 25 mmol/L (ref 22–32)
Calcium: 8.7 mg/dL — ABNORMAL LOW (ref 8.9–10.3)
Chloride: 105 mmol/L (ref 98–111)
Creatinine, Ser: 1.15 mg/dL (ref 0.61–1.24)
GFR, Estimated: >60 mL/min (ref >60)
Glucose, Bld: 83 mg/dL (ref 70–99)
Potassium: 4.3 mmol/L (ref 3.5–5.1)
Sodium: 135 mmol/L (ref 135–145)
Total Bilirubin: 1.6 mg/dL — ABNORMAL HIGH (ref 0.3–1.2)
Total Protein: 7.6 g/dL (ref 6.5–8.1)

## 2021-08-02 LAB — CBG MONITORING, ED: Glucose-Capillary: 89 mg/dL (ref 70–99)

## 2021-08-02 LAB — RESP PANEL BY RT-PCR (FLU A&B, COVID) ARPGX2
Influenza A by PCR: NEGATIVE
Influenza B by PCR: NEGATIVE
SARS Coronavirus 2 by RT PCR: NEGATIVE

## 2021-08-02 LAB — ETHANOL: Alcohol, Ethyl (B): <10 mg/dL (ref <10)

## 2021-08-02 LAB — MAGNESIUM: Magnesium: 2.3 mg/dL (ref 1.7–2.4)

## 2021-08-02 MED ORDER — PHENYTOIN SODIUM EXTENDED 100 MG PO CAPS
300.0000 mg | ORAL_CAPSULE | Freq: Every day | ORAL | Status: DC
  Administered 2021-08-02: 300 mg via ORAL
  Filled 2021-08-02: qty 3

## 2021-08-02 MED ORDER — LEVETIRACETAM 500 MG PO TABS
1000.0000 mg | ORAL_TABLET | Freq: Two times a day (BID) | ORAL | Status: DC
  Administered 2021-08-02: 1000 mg via ORAL
  Filled 2021-08-02: qty 2

## 2021-08-02 MED ORDER — LACTATED RINGERS IV BOLUS
1000.0000 mL | Freq: Once | INTRAVENOUS | Status: AC
  Administered 2021-08-02: 1000 mL via INTRAVENOUS

## 2021-08-02 NOTE — ED Triage Notes (Signed)
Pt from home with complaints of x6 seizures tonight. Pt shaking himself but is able to talk  and answer questions. Pt states that he fell this morning and hit his nuckles on the floor when he had a seizure. Pt states he didn't pick his meds up after leaving  yesterday. ?

## 2021-08-02 NOTE — Discharge Instructions (Addendum)
History discussed this very important you avoid using any illicit drugs specifically cocaine and amphetamine as these will increase your risk for seizures.  Please talk to your PCP and neurologist for any additional specialty care for this.  Please pick up your seizure medicines later today. ?

## 2021-08-02 NOTE — ED Provider Notes (Signed)
? ?[facility] ?Provider Note ? ? ? Event Date/Time  ? First MD Initiated Contact with Patient 08/02/21 0720   ?  (approximate) ? ? ?History  ? ?Seizures ? ? ?HPI ? ?Martin Cooper is a 36 y.o. male  with medical history significant of seizure, tobacco abuse, cocaine abuse, marijuana abuse, asthma, GERD and recent ED evaluation yesterday for seizures.  Patient reportedly had 5 seizures in the setting of not currently taking prescribed antiepileptics having been observed for over 2 hours and loaded with Keppra and Dilantin discharged yesterday who presents via EMS from home for evaluation of seizures.  Patient states he thinks he had 5 seizures since being discharged.  He does not remember the mall but does not think he hit his head as he was lying down.  He does think he may have injured his hand as he has some soreness and small abrasions over the back of the right hand.  He has not had a chance to pick up his prescribed Keppra or Dilantin.  He denies any EtOH use or illicit drug use since being in the ED yesterday.  He states he has had a cough for couple days.  No chest pain, shortness of breath, headache, earache, sore throat, fevers, abdominal pain, nausea, vomiting, diarrhea rash or any other extremity pain.  Denies any other recent traumatic injuries. ? ?  ? ? ?Physical Exam  ?Triage Vital Signs: ?ED Triage Vitals  ?Enc Vitals Group  ?   BP   ?   Pulse   ?   Resp   ?   Temp   ?   Temp src   ?   SpO2   ?   Weight   ?   Height   ?   Head Circumference   ?   Peak Flow   ?   Pain Score   ?   Pain Loc   ?   Pain Edu?   ?   Excl. in GC?   ? ? ?Most recent vital signs: ?Vitals:  ? 08/02/21 0727 08/02/21 0741  ?BP:  (!) 132/96  ?Pulse: 80   ?Resp: 19   ?Temp: 98.6 ?F (37 ?C)   ?SpO2: 100%   ? ? ?General: Awake, no distress.  ?CV:  Good peripheral perfusion.  2+ radial pulses ?Resp:  Normal effort.  Clear bilaterally. ?Abd:  No distention.  Soft. ?Other:  Cranial nerves II through XII are grossly  intact.  No facial droop or slurred speech.  No tenderness along the C/T/L-spine.  There is a little bit of edema and superficial abrasions over the dorsum of the right hand just proximal to the second and third metacarpal phalangeal joints.  No other obvious trauma to the hand the patient is able to flex and extend all digits against resistance.  Sensation is intact in the distribution of the radial ulnar and median nerves.  2+ radial pulse.  No other trauma to the extremities noted. ? ? ?ED Results / Procedures / Treatments  ?Labs ?(all labs ordered are listed, but only abnormal results are displayed) ?Labs Reviewed  ?COMPREHENSIVE METABOLIC PANEL - Abnormal; Notable for the following components:  ?    Result Value  ? Calcium 8.7 (*)   ? Total Bilirubin 1.6 (*)   ? All other components within normal limits  ?URINE DRUG SCREEN, QUALITATIVE ([facility] ONLY) - Abnormal; Notable for the following components:  ? Amphetamines, Ur Screen POSITIVE (*)   ? Cocaine Metabolite,Ur  [place] POSITIVE (*)   ? Cannabinoid 50 Ng, Ur [place] POSITIVE (*)   ? All other components within normal limits  ?RESP PANEL BY RT-PCR (FLU A&B, COVID) ARPGX2  ?ETHANOL  ?CBC WITH DIFFERENTIAL/PLATELET  ?MAGNESIUM  ?CBG MONITORING, ED  ? ? ? ?EKG ? ?ECG is remarkable for sinus rhythm with a ventricular rate of 84, normal axis, unremarkable intervals with some nonspecific ST change in anterior and lateral leads without other clear evidence of acute ischemia or significant arrhythmia. ? ?These changes noted today appear largely similar to those seen on EKG from 06/13/2021. ? ? ?RADIOLOGY ?Chest reviewed by myself shows no focal consoidation, effusion, edema, pneumothorax or other clear acute thoracic process. I also reviewed radiology interpretation and agree with findings described. ? ?X-ray of the right hand my interpretation without evidence of fracture or dislocation.  I also reviewed radiology's interpretation. ? ? ?PROCEDURES: ? ?Critical Care performed:  No ? ?.1-3 Lead EKG Interpretation ?Performed by: Gilles Chiquito, MD ?Authorized by: Gilles Chiquito, MD  ? ?  Interpretation: normal   ?  ECG rate assessment: normal   ?  Rhythm: sinus rhythm   ?  Ectopy: none   ?  Conduction: normal   ? ?The patient is on the cardiac monitor to evaluate for evidence of arrhythmia and/or significant heart rate changes. ? ? ?MEDICATIONS ORDERED IN ED: ?Medications  ?levETIRAcetam (KEPPRA) tablet 1,000 mg (1,000 mg Oral Given 08/02/21 0753)  ?phenytoin (DILANTIN) ER capsule 300 mg (300 mg Oral Given 08/02/21 0754)  ?lactated ringers bolus 1,000 mL (0 mLs Intravenous Stopped 08/02/21 0931)  ? ? ? ?IMPRESSION / MDM / ASSESSMENT AND PLAN / ED COURSE  ?I reviewed the triage vital signs and the nursing notes. ?             ?               ? ?Differential diagnosis includes, but is not limited to ongoing seizures related to subtherapeutic AED levels as patient has not had any of his medications today, metabolic derangements, poor sleep, we will lower suspicion at this time based on his history and exam for deep space infection head or neck, meningitis, intracranial hemorrhage or CVA.  Regard to this slight cough he has no abnormal breath sounds, fever or any other findings to suggest a PE or bacterial pneumonia.  Chest x-ray without evidence of pneumothorax or volume overload.  Suspect likely viral bronchitis. ? ?ECG is remarkable for sinus rhythm with a ventricular rate of 84, normal axis, unremarkable intervals with some nonspecific ST change in anterior and lateral leads without other clear evidence of acute ischemia or significant arrhythmia. ? ?These changes noted today appear largely similar to those seen on EKG from 06/13/2021. ? ?Low suspicion for acute intracranial mass and at this point do not feel repeat head imaging is necessary.  On chart review patient has had 8 CTs of his head in the last 6 months all reassuring. ? ?X-ray of the right hand my interpretation without evidence  of fracture or dislocation.  I also reviewed radiology's interpretation.  I agree with findings of remote fracture but no acute injury today.  Offered analgesia but patient is declining this ? ?CMP without any significant electrolyte or metabolic derangements.  Magnesium 2.3.  CBC without leukocytosis or acute anemia.  Serum ethanol undetectable.  COVID influenza PCR is negative.  UDS is positive for amphetamine cocaine and cannabis.  I discussed this with patient he states it  has been at least a week since he used any illegal drugs.  I emphasized the importance of avoiding these and taking his medicines as prescribed.  He did not take his morning medications and these were given.  He will pick up his medications when he leaves emergency room today.  He has no other acute concerns at this point think is stable discharge with outpatient PCP and neurology follow-up.  Discharged in stable condition.  No seizure-like activity over 3 hours for observation. ? ?  ? ? ?FINAL CLINICAL IMPRESSION(S) / ED DIAGNOSES  ? ?Final diagnoses:  ?Seizure (HCC)  ?Bronchitis  ?Substance abuse (HCC)  ? ? ? ?Rx / DC Orders  ? ?ED Discharge Orders   ? ? None  ? ?  ? ? ? ?Note:  This document was prepared using Dragon voice recognition software and may include unintentional dictation errors. ?  ?Gilles Chiquito, MD ?08/02/21 1018 ? ?

## 2021-08-04 LAB — PHENYTOIN LEVEL, FREE AND TOTAL: Phenytoin, Total: <0.8 ug/mL — ABNORMAL LOW (ref 10.0–20.0)

## 2021-09-17 ENCOUNTER — Emergency Department
Admission: EM | Admit: 2021-09-17 | Discharge: 2021-09-17 | Disposition: A | Discharge status: Home / Self Care | Payer: Medicaid Other | Attending: Emergency Medicine | Admitting: Emergency Medicine

## 2021-09-17 ENCOUNTER — Other Ambulatory Visit: Payer: Self-pay

## 2021-09-17 DIAGNOSIS — R569 Unspecified convulsions: Secondary | ICD-10-CM | POA: Insufficient documentation

## 2021-09-17 DIAGNOSIS — M25571 Pain in right ankle and joints of right foot: Secondary | ICD-10-CM | POA: Insufficient documentation

## 2021-09-17 LAB — CBC WITH DIFFERENTIAL/PLATELET
Abs Immature Granulocytes: 0.01 10*3/uL (ref 0.00–0.07)
Basophils Absolute: 0 10*3/uL (ref 0.0–0.1)
Basophils Relative: 1 %
Eosinophils Absolute: 0.1 10*3/uL (ref 0.0–0.5)
Eosinophils Relative: 1 %
HCT: 38.9 % — ABNORMAL LOW (ref 39.0–52.0)
Hemoglobin: 12.6 g/dL — ABNORMAL LOW (ref 13.0–17.0)
Immature Granulocytes: 0 %
Lymphocytes Relative: 39 %
Lymphs Abs: 2.3 10*3/uL (ref 0.7–4.0)
MCH: 30.1 pg (ref 26.0–34.0)
MCHC: 32.4 g/dL (ref 30.0–36.0)
MCV: 92.8 fL (ref 80.0–100.0)
Monocytes Absolute: 0.4 10*3/uL (ref 0.1–1.0)
Monocytes Relative: 7 %
Neutro Abs: 2.9 10*3/uL (ref 1.7–7.7)
Neutrophils Relative %: 52 %
Platelets: 250 10*3/uL (ref 150–400)
RBC: 4.19 MIL/uL — ABNORMAL LOW (ref 4.22–5.81)
RDW: 11.9 % (ref 11.5–15.5)
WBC: 5.7 10*3/uL (ref 4.0–10.5)
nRBC: 0 % (ref 0.0–0.2)

## 2021-09-17 LAB — BASIC METABOLIC PANEL
Anion gap: 6 (ref 5–15)
BUN: 9 mg/dL (ref 6–20)
CO2: 27 mmol/L (ref 22–32)
Calcium: 9 mg/dL (ref 8.9–10.3)
Chloride: 107 mmol/L (ref 98–111)
Creatinine, Ser: 1.19 mg/dL (ref 0.61–1.24)
GFR, Estimated: >60 mL/min (ref >60)
Glucose, Bld: 120 mg/dL — ABNORMAL HIGH (ref 70–99)
Potassium: 3.8 mmol/L (ref 3.5–5.1)
Sodium: 140 mmol/L (ref 135–145)

## 2021-09-17 LAB — PHENYTOIN LEVEL, TOTAL: Phenytoin Lvl: <2.5 ug/mL — ABNORMAL LOW (ref 10.0–20.0)

## 2021-09-17 LAB — TROPONIN I (HIGH SENSITIVITY): Troponin I (High Sensitivity): 4 ng/L (ref <18)

## 2021-09-17 MED ORDER — PHENYTOIN SODIUM EXTENDED 100 MG PO CAPS
300.0000 mg | ORAL_CAPSULE | Freq: Every day | ORAL | 0 refills | Status: DC
  Filled 2021-09-17: qty 90, 30d supply, fill #0

## 2021-09-17 MED ORDER — SODIUM CHLORIDE 0.9 % IV BOLUS
1000.0000 mL | Freq: Once | INTRAVENOUS | Status: AC
  Administered 2021-09-17: 1000 mL via INTRAVENOUS

## 2021-09-17 MED ORDER — PHENYTOIN SODIUM EXTENDED 30 MG PO CAPS
30.0000 mg | ORAL_CAPSULE | Freq: Every day | ORAL | 0 refills | Status: DC
  Filled 2021-09-17: qty 30, 30d supply, fill #0

## 2021-09-17 MED ORDER — SODIUM CHLORIDE 0.9 % IV SOLN
20.0000 mg/kg | Freq: Once | INTRAVENOUS | Status: AC
  Administered 2021-09-17: 1630 mg via INTRAVENOUS
  Filled 2021-09-17: qty 32.6

## 2021-09-17 MED ORDER — LEVETIRACETAM IN NACL 1000 MG/100ML IV SOLN
1000.0000 mg | Freq: Once | INTRAVENOUS | Status: AC
  Administered 2021-09-17: 1000 mg via INTRAVENOUS
  Filled 2021-09-17: qty 100

## 2021-09-17 MED ORDER — LEVETIRACETAM 500 MG PO TABS
1000.0000 mg | ORAL_TABLET | Freq: Two times a day (BID) | ORAL | 0 refills | Status: DC
  Filled 2021-09-17: qty 120, 30d supply, fill #0

## 2021-09-17 NOTE — ED Triage Notes (Signed)
BIB [facility] FROM HOME. Pt had full body seizure then fell down two stairs. Complaint of right ankle pain. Pt ambulatory on scene. CAOx3. Pt slightly altered. Didn't take keppra today and is out of keppra. Pt out of meds x 2 weeks.  Vitals

## 2021-09-17 NOTE — ED Notes (Signed)
RN at bedside when social work came to bedside to ask about medications and why patient cannot get the medications. Pt advised he cannot afford the medication. Pt receives SSI amount of $900. RN educated patient about importance of paying for his medications. He advised his meds are $150.

## 2021-09-17 NOTE — ED Provider Notes (Signed)
[facility] Provider Note    Event Date/Time   First MD Initiated Contact with Patient 09/17/21 1130     (approximate)   History   No chief complaint on file.   HPI  Martin Cooper is a 36 y.o. male here with seizure.  The patient has a well-documented history of epilepsy.  He reportedly has not had his medication in the last several weeks because he could not afford it.  He states it is $150 when he goes to pick it up.  He states that earlier today, he had a seizure.  Per significant other, he had a generalized seizure lasting 20 to 30 minutes per report.  However, on further questioning, the shaking lasted only several minutes and then he was drowsy afterwards.  This is typical of his seizures.  He now is near back to his baseline.  Denies any recent fevers or chills.  No recent illnesses.  Denies any recent drug use.  Denies any focal numbness or weakness.  He does complain of some right ankle pain, which she has from a previous injury that he sustained during a seizure episode.  He was able to ambulate, however.  Pain is more so over his right Achilles tendon.     Physical Exam   Triage Vital Signs: ED Triage Vitals  Enc Vitals Group     BP 09/17/21 1129 (!) 115/92     Pulse Rate 09/17/21 1129 65     Resp 09/17/21 1129 16     Temp 09/17/21 1129 98.5 F (36.9 C)     Temp Source 09/17/21 1129 Oral     SpO2 09/17/21 1129 98 %     Weight 09/17/21 1130 180 lb (81.6 kg)     Height 09/17/21 1130 5\' 10"  (1.778 m)     Head Circumference --      Peak Flow --      Pain Score 09/17/21 1130 10     Pain Loc --      Pain Edu? --      Excl. in GC? --     Most recent vital signs: Vitals:   09/17/21 1526 09/17/21 1527  BP:  110/88  Pulse: 86 71  Resp: 15 20  Temp:    SpO2: 100% 100%     General: Awake, no distress.  CV:  Good peripheral perfusion.  Resp:  Normal effort.  Abd:  No distention.  Other:  Cranial nerves II through XII intact.  Strength 5  5 bilateral upper and lower extremities.  Normal sensation to light touch.  Mild tenderness over the right Achilles tendon, no deformity.  Fully intact foot dorsal and plantarflexion.  No swelling.  No bony tenderness or bruising.   ED Results / Procedures / Treatments   Labs (all labs ordered are listed, but only abnormal results are displayed) Labs Reviewed  CBC WITH DIFFERENTIAL/PLATELET - Abnormal; Notable for the following components:      Result Value   RBC 4.19 (*)    Hemoglobin 12.6 (*)    HCT 38.9 (*)    All other components within normal limits  BASIC METABOLIC PANEL - Abnormal; Notable for the following components:   Glucose, Bld 120 (*)    All other components within normal limits  PHENYTOIN LEVEL, TOTAL - Abnormal; Notable for the following components:   Phenytoin Lvl <2.5 (*)    All other components within normal limits  TROPONIN I (HIGH SENSITIVITY)     EKG  RADIOLOGY    I also independently reviewed and agree with radiologist interpretations.   PROCEDURES:  Critical Care performed: No    MEDICATIONS ORDERED IN ED: Medications  levETIRAcetam (KEPPRA) IVPB 1000 mg/100 mL premix (0 mg Intravenous Stopped 09/17/21 1158)  levETIRAcetam (KEPPRA) IVPB 1000 mg/100 mL premix (0 mg Intravenous Stopped 09/17/21 1237)  phenytoin (DILANTIN) 1,630 mg in sodium chloride 0.9 % 250 mL IVPB (0 mg Intravenous Stopped 09/17/21 1445)  sodium chloride 0.9 % bolus 1,000 mL (0 mLs Intravenous Stopped 09/17/21 1445)     IMPRESSION / MDM / ASSESSMENT AND PLAN / ED COURSE  I reviewed the triage vital signs and the nursing notes.                              Ddx:  Differential includes the following, with pertinent life- or limb-threatening emergencies considered:  Breakthrough seizure in setting of medication nonadherence, electrolyte abnormality, syncope with convulsions  Patient's presentation is most consistent with acute illness / injury with system symptoms.  MDM:   36 yo M with h/o epilepsy here with breakthrough seizure. Likely 2/2 medication nonadherence, as pt has reportedly had difficulty obtaining it. SW has been consulted previously. No focal deficits. No trauma. Pt is otherwise well appearing. Screening labs unremarkable. No significant leukocytosis, no infectious sx. Lytes largely unremarkable. Pt loaded with IV keppra, dilantin here. SW consulted and will send meds to [facility] pharmacy for hopefully reduced cost. Importance of adherence discussed. Return precautions given.   MEDICATIONS GIVEN IN ED: Medications  levETIRAcetam (KEPPRA) IVPB 1000 mg/100 mL premix (0 mg Intravenous Stopped 09/17/21 1158)  levETIRAcetam (KEPPRA) IVPB 1000 mg/100 mL premix (0 mg Intravenous Stopped 09/17/21 1237)  phenytoin (DILANTIN) 1,630 mg in sodium chloride 0.9 % 250 mL IVPB (0 mg Intravenous Stopped 09/17/21 1445)  sodium chloride 0.9 % bolus 1,000 mL (0 mLs Intravenous Stopped 09/17/21 1445)     Consults:  None   EMR reviewed  Prior ED visits, prior social work notes     FINAL CLINICAL IMPRESSION(S) / ED DIAGNOSES   Final diagnoses:  Seizure (HCC)     Rx / DC Orders   ED Discharge Orders          Ordered    levETIRAcetam (KEPPRA) 500 MG tablet  2 times daily        09/17/21 1605    phenytoin (DILANTIN) 300 MG ER capsule  Daily at bedtime        09/17/21 1605    phenytoin (DILANTIN) 30 MG ER capsule  Daily at bedtime        09/17/21 1605             Note:  This document was prepared using Dragon voice recognition software and may include unintentional dictation errors.   Shaune Pollack, MD 09/17/21 [place]

## 2021-09-17 NOTE — TOC Initial Note (Signed)
Transition of Care [facility]) - Initial/Assessment Note    Patient Details  Name: Martin Cooper MRN: [id number] Date of Birth: 06/15/1985  [facility]) CM/SW Contact:    Merrily Brittle, LCSWA Phone Number: 09/17/2021, 1:27 PM  Clinical Narrative:                  [facility] consult for mediatation assistance. CSW spoke to patient at bedside to determine what barriers has stopped patient from receiving medication. Patient reported medication being expensive (even with insurance). Nurse at bedside educated patient on importance of prioritizing medication.   CSW consulted with [facility] team to determine if there are any programs that can assist the patient. No resources reported.        Patient Goals and CMS Choice        Expected Discharge Plan and Services                                                Prior Living Arrangements/Services                       Activities of Daily Living      Permission Sought/Granted                  Emotional Assessment              Admission diagnosis:  Seizure Activity ems Patient Active Problem List   Diagnosis Date Noted   Seizures (HCC) 06/10/2021   Seizure (HCC) 06/09/2021   Asthma 06/09/2021   Tobacco abuse 06/09/2021   GERD (gastroesophageal reflux disease) 06/09/2021   Polysubstance abuse (HCC) 06/09/2021   PCP:  Oneita Hurt No Pharmacy:   [facility] [address] [place] [address] Phone: [phone] Fax: [phone]     Social Determinants of Health (SDOH) Interventions    Readmission Risk Interventions     View : No data to display.

## 2021-09-17 NOTE — Discharge Instructions (Signed)
I resent your prescriptions to the [place] [facility] pharmacy, which may be able to help with the cost of these.  It is very important that you fill your prescriptions to prevent further seizures.

## 2021-09-18 ENCOUNTER — Other Ambulatory Visit: Payer: Self-pay

## 2021-09-19 ENCOUNTER — Other Ambulatory Visit: Payer: Self-pay

## 2021-09-20 ENCOUNTER — Other Ambulatory Visit: Payer: Self-pay

## 2021-09-21 ENCOUNTER — Other Ambulatory Visit: Payer: Self-pay

## 2021-09-22 ENCOUNTER — Other Ambulatory Visit: Payer: Self-pay

## 2021-10-03 ENCOUNTER — Other Ambulatory Visit: Payer: Self-pay

## 2021-10-03 ENCOUNTER — Emergency Department
Admission: EM | Admit: 2021-10-03 | Discharge: 2021-10-03 | Disposition: A | Discharge status: Home / Self Care | Payer: Medicaid Other | Attending: Emergency Medicine | Admitting: Emergency Medicine

## 2021-10-03 ENCOUNTER — Encounter: Payer: Self-pay | Admitting: *Deleted

## 2021-10-03 ENCOUNTER — Emergency Department: Payer: Medicaid Other

## 2021-10-03 DIAGNOSIS — Y92009 Unspecified place in unspecified non-institutional (private) residence as the place of occurrence of the external cause: Secondary | ICD-10-CM | POA: Diagnosis not present

## 2021-10-03 DIAGNOSIS — S86001A Unspecified injury of right Achilles tendon, initial encounter: Secondary | ICD-10-CM | POA: Diagnosis not present

## 2021-10-03 DIAGNOSIS — W108XXA Fall (on) (from) other stairs and steps, initial encounter: Secondary | ICD-10-CM | POA: Diagnosis not present

## 2021-10-03 DIAGNOSIS — R42 Dizziness and giddiness: Secondary | ICD-10-CM | POA: Insufficient documentation

## 2021-10-03 DIAGNOSIS — W19XXXA Unspecified fall, initial encounter: Secondary | ICD-10-CM

## 2021-10-03 DIAGNOSIS — S99911A Unspecified injury of right ankle, initial encounter: Secondary | ICD-10-CM | POA: Diagnosis present

## 2021-10-03 DIAGNOSIS — M25571 Pain in right ankle and joints of right foot: Secondary | ICD-10-CM

## 2021-10-03 LAB — CBC
HCT: 41.4 % (ref 39.0–52.0)
Hemoglobin: 13.3 g/dL (ref 13.0–17.0)
MCH: 30.1 pg (ref 26.0–34.0)
MCHC: 32.1 g/dL (ref 30.0–36.0)
MCV: 93.7 fL (ref 80.0–100.0)
Platelets: 311 10*3/uL (ref 150–400)
RBC: 4.42 MIL/uL (ref 4.22–5.81)
RDW: 12.1 % (ref 11.5–15.5)
WBC: 6.6 10*3/uL (ref 4.0–10.5)
nRBC: 0 % (ref 0.0–0.2)

## 2021-10-03 LAB — BASIC METABOLIC PANEL
Anion gap: 6 (ref 5–15)
BUN: 12 mg/dL (ref 6–20)
CO2: 27 mmol/L (ref 22–32)
Calcium: 9.4 mg/dL (ref 8.9–10.3)
Chloride: 107 mmol/L (ref 98–111)
Creatinine, Ser: 0.95 mg/dL (ref 0.61–1.24)
GFR, Estimated: >60 mL/min (ref >60)
Glucose, Bld: 87 mg/dL (ref 70–99)
Potassium: 4 mmol/L (ref 3.5–5.1)
Sodium: 140 mmol/L (ref 135–145)

## 2021-10-03 LAB — TROPONIN I (HIGH SENSITIVITY): Troponin I (High Sensitivity): 2 ng/L (ref <18)

## 2021-10-03 LAB — CBG MONITORING, ED: Glucose-Capillary: 82 mg/dL (ref 70–99)

## 2021-10-03 MED ORDER — KETOROLAC TROMETHAMINE 30 MG/ML IJ SOLN
30.0000 mg | Freq: Once | INTRAMUSCULAR | Status: AC
  Administered 2021-10-03: 30 mg via INTRAMUSCULAR
  Filled 2021-10-03: qty 1

## 2021-10-03 NOTE — ED Notes (Signed)
Pt gives verbal consent to DC 

## 2021-10-03 NOTE — ED Provider Notes (Signed)
[facility] Provider Note    Event Date/Time   First MD Initiated Contact with Patient 10/03/21 1820     (approximate)   History   Loss of Consciousness and Ankle Pain   HPI  Martin Cooper is a 36 y.o. male who presents to the ED for evaluation of Loss of Consciousness and Ankle Pain   I reviewed recurrent ED visits and occasional medical admissions for seizures.  Telephone encounter from neurology where patient does not answer.  He is prescribed phenytoin and Keppra.  Patient presents to the ED for evaluation of right posterior ankle pain after a fall and possible syncope that occurred this afternoon.  He reports he was hungry, got up from supine and felt dizzy as he was going down the steps and fell.  Reports he may have passed out.  Girlfriend reports witnessing this and denies any significant or prolonged episodes of syncope and denies any noted seizure activity.  Patient reports he was just dizzy.  He reports compliance with his antiepileptic regimen.  Reports pain to his right posterior ankle around his Achilles tendon.  Physical Exam   Triage Vital Signs: ED Triage Vitals  Enc Vitals Group     BP 10/03/21 1703 (!) 154/107     Pulse Rate 10/03/21 1703 61     Resp 10/03/21 1703 18     Temp 10/03/21 1703 98.6 F (37 C)     Temp Source 10/03/21 1703 Oral     SpO2 10/03/21 1703 99 %     Weight 10/03/21 1704 189 lb 9.5 oz (86 kg)     Height 10/03/21 1704 5\' 10"  (1.778 m)     Head Circumference --      Peak Flow --      Pain Score 10/03/21 1704 5     Pain Loc --      Pain Edu? --      Excl. in GC? --     Most recent vital signs: Vitals:   10/03/21 1915 10/03/21 2000  BP: (!) 150/98 (!) 152/90  Pulse: (!) 59 62  Resp: 17 17  Temp:    SpO2: 100% 100%    General: Awake, no distress.  CV:  Good peripheral perfusion. RRR Resp:  Normal effort.  Abd:  No distention.  MSK:  Tenderness over the Achilles tendon.  Thompson test normal.  No  laceration or open injury.  No other signs of trauma throughout on palpation of all 4 extremities and his back. Neuro:  No focal deficits appreciated. Cranial nerves II through XII intact 5/5 strength and sensation in all 4 extremities Other:     ED Results / Procedures / Treatments   Labs (all labs ordered are listed, but only abnormal results are displayed) Labs Reviewed  BASIC METABOLIC PANEL  CBC  CBG MONITORING, ED  TROPONIN I (HIGH SENSITIVITY)  TROPONIN I (HIGH SENSITIVITY)    EKG Sinus rhythm with a rate of 53 bpm.  Normal axis and intervals.  Stigmata of LVH.  Nonspecific ST changes inferiorly and laterally without STEMI.  Likely benign early repolarization.  RADIOLOGY I interpret plain film of the right ankle without evidence of acute fracture or dislocation.  Official radiology report(s): DG Ankle Complete Right  Result Date: 10/03/2021 CLINICAL DATA:  Injury. Was dizzy when woke up this afternoon and fell hurting his ankle. EXAM: RIGHT ANKLE - COMPLETE 3+ VIEW COMPARISON:  Right ankle radiographs 05/27/2021 FINDINGS: The ankle mortise is symmetric and intact.  Unchanged, well corticated, chronic ossicle at the posterior dorsal aspect of the navicular. Joint spaces are preserved. No acute fracture or dislocation. IMPRESSION: No acute abnormality. Electronically Signed   By: Neita Garnet M.D.   On: 10/03/2021 17:37    PROCEDURES and INTERVENTIONS:  .1-3 Lead EKG Interpretation  Performed by: Delton Prairie, MD Authorized by: Delton Prairie, MD     Interpretation: normal     ECG rate:  62   ECG rate assessment: normal     Rhythm: sinus rhythm     Ectopy: none     Conduction: normal     Medications  ketorolac (TORADOL) 30 MG/ML injection 30 mg (30 mg Intramuscular Given 10/03/21 2118)     IMPRESSION / MDM / ASSESSMENT AND PLAN / ED COURSE  I reviewed the triage vital signs and the nursing notes.  Differential diagnosis includes, but is not limited to, seizure,  syncope, fall, tendon disruption  Patient presents with right ankle pain after a fall and possible syncope.  He has some tenderness and pain over his Achilles tendon, but no evidence of total tendinous rupture.  Imaging of the ankle without evidence of fracture or dislocation.  Blood work is benign with normal metabolic panel, CBC and troponin.  EKG is nonischemic.  He is kept on the monitor without dysrhythmias throughout his time.  I considered observation admission for syncope for this patient, but ultimately we do plan on outpatient management.      FINAL CLINICAL IMPRESSION(S) / ED DIAGNOSES   Final diagnoses:  Acute right ankle pain  Fall, initial encounter  Achilles tendon injury, right, initial encounter     Rx / DC Orders   ED Discharge Orders     None        Note:  This document was prepared using Dragon voice recognition software and may include unintentional dictation errors.   Delton Prairie, MD 10/03/21 2127

## 2021-10-03 NOTE — Discharge Instructions (Signed)
Please take Tylenol and ibuprofen/Advil for your pain.  It is safe to take them together, or to alternate them every few hours.  Take up to 1000mg of Tylenol at a time, up to 4 times per day.  Do not take more than 4000 mg of Tylenol in 24 hours.  For ibuprofen, take 400-600 mg, 3 - 4 times per day.  

## 2021-10-03 NOTE — ED Provider Triage Note (Signed)
Emergency Medicine Provider Triage Evaluation Note  Martin Cooper , a 36 y.o. male  was evaluated in triage.  Pt complains of syncopal episode, ankle pain..  Review of Systems  Positive: See above Negative: Fever chills  Physical Exam  BP (!) 154/107 (BP Location: Right Arm)   Pulse 61   Temp 98.6 F (37 C) (Oral)   Resp 18   Ht 5\' 10"  (1.778 m)   Wt 86 kg   SpO2 99%   BMI 27.20 kg/m  Gen:   Awake, no distress   Resp:  Normal effort  MSK:   Moves extremities without difficulty, right ankle tender Other:    Medical Decision Making  Medically screening exam initiated at 5:11 PM.  Appropriate orders placed.  Martin Cooper was informed that the remainder of the evaluation will be completed by another provider, this initial triage assessment does not replace that evaluation, and the importance of remaining in the ED until their evaluation is complete.     Flossie Buffy, PA-C 10/03/21 1712

## 2021-10-03 NOTE — ED Triage Notes (Signed)
Pt states he became dizzy headed and passed out at home injuring his right ankle.  No chest pain or sob.  No headache.  Pt continues to have dizziness.    Pt alert.

## 2022-03-19 ENCOUNTER — Other Ambulatory Visit: Payer: Self-pay

## 2022-03-19 ENCOUNTER — Emergency Department (HOSPITAL_COMMUNITY): Payer: Medicaid Other

## 2022-03-19 ENCOUNTER — Emergency Department (HOSPITAL_COMMUNITY)
Admission: EM | Admit: 2022-03-19 | Discharge: 2022-03-19 | Disposition: A | Discharge status: Home / Self Care | Payer: Medicaid Other | Attending: Emergency Medicine | Admitting: Emergency Medicine

## 2022-03-19 DIAGNOSIS — R569 Unspecified convulsions: Secondary | ICD-10-CM | POA: Diagnosis present

## 2022-03-19 DIAGNOSIS — G40909 Epilepsy, unspecified, not intractable, without status epilepticus: Secondary | ICD-10-CM | POA: Diagnosis not present

## 2022-03-19 LAB — CBC WITH DIFFERENTIAL/PLATELET
Abs Immature Granulocytes: 0 10*3/uL (ref 0.00–0.07)
Basophils Absolute: 0 10*3/uL (ref 0.0–0.1)
Basophils Relative: 1 %
Eosinophils Absolute: 0.1 10*3/uL (ref 0.0–0.5)
Eosinophils Relative: 3 %
HCT: 39.9 % (ref 39.0–52.0)
Hemoglobin: 12.8 g/dL — ABNORMAL LOW (ref 13.0–17.0)
Immature Granulocytes: 0 %
Lymphocytes Relative: 48 %
Lymphs Abs: 2.2 10*3/uL (ref 0.7–4.0)
MCH: 30.5 pg (ref 26.0–34.0)
MCHC: 32.1 g/dL (ref 30.0–36.0)
MCV: 95.2 fL (ref 80.0–100.0)
Monocytes Absolute: 0.2 10*3/uL (ref 0.1–1.0)
Monocytes Relative: 5 %
Neutro Abs: 2 10*3/uL (ref 1.7–7.7)
Neutrophils Relative %: 43 %
Platelets: 268 10*3/uL (ref 150–400)
RBC: 4.19 MIL/uL — ABNORMAL LOW (ref 4.22–5.81)
RDW: 11.9 % (ref 11.5–15.5)
WBC: 4.6 10*3/uL (ref 4.0–10.5)
nRBC: 0 % (ref 0.0–0.2)

## 2022-03-19 LAB — ETHANOL: Alcohol, Ethyl (B): <10 mg/dL (ref <10)

## 2022-03-19 LAB — PHENYTOIN LEVEL, TOTAL: Phenytoin Lvl: <2.5 ug/mL — ABNORMAL LOW (ref 10.0–20.0)

## 2022-03-19 LAB — CBG MONITORING, ED: Glucose-Capillary: 92 mg/dL (ref 70–99)

## 2022-03-19 LAB — BASIC METABOLIC PANEL
Anion gap: 4 — ABNORMAL LOW (ref 5–15)
BUN: 7 mg/dL (ref 6–20)
CO2: 29 mmol/L (ref 22–32)
Calcium: 9.3 mg/dL (ref 8.9–10.3)
Chloride: 109 mmol/L (ref 98–111)
Creatinine, Ser: 0.95 mg/dL (ref 0.61–1.24)
GFR, Estimated: >60 mL/min (ref >60)
Glucose, Bld: 103 mg/dL — ABNORMAL HIGH (ref 70–99)
Potassium: 4.7 mmol/L (ref 3.5–5.1)
Sodium: 142 mmol/L (ref 135–145)

## 2022-03-19 MED ORDER — LEVETIRACETAM IN NACL 1000 MG/100ML IV SOLN
1000.0000 mg | Freq: Once | INTRAVENOUS | Status: AC
  Administered 2022-03-19: 1000 mg via INTRAVENOUS
  Filled 2022-03-19: qty 100

## 2022-03-19 NOTE — ED Notes (Signed)
Urinal at bedside.  

## 2022-03-19 NOTE — ED Provider Notes (Signed)
[facility]-EMERGENCY DEPT Provider Note   CSN: [id number] Arrival date & time: 03/19/22  1335     History  Chief Complaint  Patient presents with   Seizures    Martin Cooper is a 36 y.o. male.  36 year old male with prior medical history as detailed below presents for evaluation.  Patient reportedly had a seizure earlier today.  Patient with history of seizures.  Patient is prescribed Keppra.  Patient reports that he did not "take his Keppra yet today because he had not eaten".  Patient apparently had a witnessed seizure.  Patient's family called EMS.  It is unclear how long the actual seizure episode happened.  Patient is alert and oriented x4 on arrival.  He is without specific complaint other than complaint of mild pain to the right ankle.  He reports that he struck his ankle against the floor as he had a seizure.  EMS reports possible history of crack cocaine use.  Patient reports reported medication noncompliance.  The history is provided by the patient and medical records.       Home Medications Prior to Admission medications   Medication Sig Start Date End Date Taking? Authorizing Provider  levETIRAcetam (KEPPRA) 500 MG tablet Take 2 tablets (1,000 mg total) by mouth 2 (two) times daily. 09/17/21 10/20/21  Shaune Pollack, MD  phenytoin (DILANTIN) 30 MG ER capsule Take 1 capsule (30 mg total) by mouth at bedtime. 09/17/21 10/20/21  Shaune Pollack, MD  phenytoin (DILANTIN) 100 MG ER capsule Take 3 capsules (300 mg total) by mouth at bedtime. 09/17/21 10/21/21  Shaune Pollack, MD      Allergies    Patient has no known allergies.    Review of Systems   Review of Systems  All other systems reviewed and are negative.   Physical Exam Updated Vital Signs BP (!) 130/92   Pulse 63   Temp 97.7 F (36.5 C) (Oral)   Resp 16   SpO2 100%  Physical Exam Vitals and nursing note reviewed.  Constitutional:      General: He is not in acute distress.     Appearance: Normal appearance. He is well-developed.  HENT:     Head: Normocephalic and atraumatic.  Eyes:     Conjunctiva/sclera: Conjunctivae normal.     Pupils: Pupils are equal, round, and reactive to light.  Cardiovascular:     Rate and Rhythm: Normal rate and regular rhythm.     Heart sounds: Normal heart sounds.  Pulmonary:     Effort: Pulmonary effort is normal. No respiratory distress.     Breath sounds: Normal breath sounds.  Abdominal:     General: There is no distension.     Palpations: Abdomen is soft.     Tenderness: There is no abdominal tenderness.  Musculoskeletal:        General: No deformity. Normal range of motion.     Cervical back: Normal range of motion and neck supple.     Comments: Mild tenderness to palpation overlying the medial aspect of the right ankle.  No bony step off, no crepitus, no ankle instability  Skin:    General: Skin is warm and dry.  Neurological:     General: No focal deficit present.     Mental Status: He is alert and oriented to person, place, and time.     ED Results / Procedures / Treatments   Labs (all labs ordered are listed, but only abnormal results are displayed) Labs Reviewed  CBC  WITH DIFFERENTIAL/PLATELET - Abnormal; Notable for the following components:      Result Value   RBC 4.19 (*)    Hemoglobin 12.8 (*)    All other components within normal limits  BASIC METABOLIC PANEL - Abnormal; Notable for the following components:   Glucose, Bld 103 (*)    Anion gap 4 (*)    All other components within normal limits  PHENYTOIN LEVEL, TOTAL - Abnormal; Notable for the following components:   Phenytoin Lvl <2.5 (*)    All other components within normal limits  ETHANOL  RAPID URINE DRUG SCREEN, HOSP PERFORMED  URINALYSIS, ROUTINE W REFLEX MICROSCOPIC  CBG MONITORING, ED    EKG EKG Interpretation  Date/Time:  Monday March 19 2022 13:44:27 EST Ventricular Rate:  58 PR Interval:  141 QRS Duration: 96 QT  Interval:  411 QTC Calculation: 404 R Axis:   75 Text Interpretation: Sinus rhythm Probable left ventricular hypertrophy ST elev, probable normal early repol pattern Confirmed by Kristine Royal [phone]) on 03/19/2022 1:53:02 PM  Radiology DG Ankle 2 Views Right  Result Date: 03/19/2022 CLINICAL DATA:  Right ankle pain after seizure. EXAM: RIGHT ANKLE - 2 VIEW COMPARISON:  10/03/2021 FINDINGS: Negative for acute fracture or dislocation. Normal alignment. No focal soft tissue abnormality. IMPRESSION: Negative. Electronically Signed   By: Richarda Overlie M.D.   On: 03/19/2022 14:08    Procedures Procedures    Medications Ordered in ED Medications  levETIRAcetam (KEPPRA) IVPB 1000 mg/100 mL premix (has no administration in time range)    ED Course/ Medical Decision Making/ A&P                           Medical Decision Making Amount and/or Complexity of Data Reviewed Labs: ordered. Radiology: ordered.  Risk Prescription drug management.    Medical Screen Complete  This patient presented to the ED with complaint of seizure.  This complaint involves an extensive number of treatment options. The initial differential diagnosis includes, but is not limited to, medication noncompliance, metabolic abnormality, trauma related seizure, etc.  This presentation is: Acute, Chronic, Self-Limited, Previously Undiagnosed, Uncertain Prognosis, Complicated, Systemic Symptoms, and Threat to Life/Bodily Function  Patient with prior history of known seizure disorder who presents after breakthrough seizure.  Patient freely admits noncompliance with previously prescribed Keppra.  Patient reports that he has a sufficient quantity of Keppra at home.  Patient reports that he just has not taken it.  Patient without evidence of significant injury related to his reported seizure.  Screening labs obtained are without significant abnormality.  After period of observation patient feels improved.  He now desires DC  home.  He does understand need to take his Keppra as it has been prescribed.  He understands the need to abstain from using drugs such as cocaine.  Patient also understands that he cannot drive or operate heavy machinery while his seizure disorder is uncontrolled.  Importance of close follow-up is stressed.  Strict return precautions given and understood.  Additional history obtained:  External records from outside sources obtained and reviewed including prior ED visits and prior Inpatient records.    Lab Tests:  I ordered and personally interpreted labs.  The pertinent results include: BC, BMP, Dilantin, urine tox, EtOH   Imaging Studies ordered:  I ordered imaging studies including plain films of right ankle I independently visualized and interpreted obtained imaging which showed NAD I agree with the radiologist interpretation.  Medicines ordered:  I  ordered medication including Keppra for seizure Reevaluation of the patient after these medicines showed that the patient: improved   Problem List / ED Course:  Seizure, breakthrough   Reevaluation:  After the interventions noted above, I reevaluated the patient and found that they have: improved  Disposition:  After consideration of the diagnostic results and the patients response to treatment, I feel that the patent would benefit from close outpatient follow up.          Final Clinical Impression(s) / ED Diagnoses Final diagnoses:  Seizure disorder [facility])    Rx / DC Orders ED Discharge Orders     None         Wynetta Fines, MD 03/19/22 1506

## 2022-03-19 NOTE — ED Triage Notes (Signed)
EMS reports mother states Pt had an hour long seizure. Pt non compliant with medication, and per mother smokes crack. Pt fell from chair onto floor per mother. Pt states right ankle pain. Hx of seizures.  BP 1238/80 HR 50 Sp02 100 RA RR 16 CBG 95

## 2022-03-19 NOTE — Discharge Instructions (Signed)
   Return for any problem.    Take Keppra as prescribed.  Do not use illicit drugs such as cocaine.

## 2022-04-11 ENCOUNTER — Emergency Department (HOSPITAL_COMMUNITY)
Admission: EM | Admit: 2022-04-11 | Discharge: 2022-04-11 | Disposition: A | Discharge status: Home / Self Care | Payer: Medicaid Other | Attending: Emergency Medicine | Admitting: Emergency Medicine

## 2022-04-11 ENCOUNTER — Other Ambulatory Visit: Payer: Self-pay

## 2022-04-11 ENCOUNTER — Emergency Department (HOSPITAL_COMMUNITY): Payer: Medicaid Other

## 2022-04-11 DIAGNOSIS — J45909 Unspecified asthma, uncomplicated: Secondary | ICD-10-CM | POA: Insufficient documentation

## 2022-04-11 DIAGNOSIS — M79641 Pain in right hand: Secondary | ICD-10-CM

## 2022-04-11 LAB — CBG MONITORING, ED: Glucose-Capillary: 97 mg/dL (ref 70–99)

## 2022-04-11 MED ORDER — IBUPROFEN 600 MG PO TABS
600.0000 mg | ORAL_TABLET | Freq: Four times a day (QID) | ORAL | 0 refills | Status: DC | PRN
  Filled 2022-04-11: qty 30, 8d supply, fill #0

## 2022-04-11 MED ORDER — IBUPROFEN 200 MG PO TABS
600.0000 mg | ORAL_TABLET | Freq: Once | ORAL | Status: AC
  Administered 2022-04-11: 600 mg via ORAL
  Filled 2022-04-11: qty 3

## 2022-04-11 NOTE — ED Provider Notes (Signed)
[facility]-EMERGENCY DEPT Provider Note   CSN: [id number] Arrival date & time: 04/11/22  0945     History  Chief Complaint  Patient presents with   Hand Pain    Martin Cooper is a 36 y.o. male.   Hand Pain   36 year old male presents emergency department with complaints ofright hand pain.  Patient reports right hand pain and swelling for approximately past month.  Reports history of surgery on affected hand.  Has taken only Tylenol/Motrin for pain which has helped some.  Also reports episode of with his partner had difficulty waking him up this morning.  States he was already asleep but felt like he "passed out".  He is concerned about possible "low blood sugar because family history of diabetes ".  Patient states that his partner was able to fully awake him after approximately 1 minute of trying.  Denies fever, chills, night sweats, chest pain, shortness of breath.  Past medical history significant for asthma, seizure, GERD, polysubstance abuse  Home Medications Prior to Admission medications   Medication Sig Start Date End Date Taking? Authorizing Provider  ibuprofen (ADVIL) 600 MG tablet Take 1 tablet (600 mg total) by mouth every 6 (six) hours as needed. 04/11/22  Yes Sherian Maroon A, PA  levETIRAcetam (KEPPRA) 500 MG tablet Take 2 tablets (1,000 mg total) by mouth 2 (two) times daily. 09/17/21 10/20/21  Shaune Pollack, MD  phenytoin (DILANTIN) 30 MG ER capsule Take 1 capsule (30 mg total) by mouth at bedtime. 09/17/21 10/20/21  Shaune Pollack, MD  phenytoin (DILANTIN) 100 MG ER capsule Take 3 capsules (300 mg total) by mouth at bedtime. 09/17/21 10/21/21  Shaune Pollack, MD      Allergies    Patient has no known allergies.    Review of Systems   Review of Systems  All other systems reviewed and are negative.   Physical Exam Updated Vital Signs BP 118/80   Pulse (!) 57   Temp 98.2 F (36.8 C) (Oral)   Resp 16   Ht 5\' 10"  (1.778 m)   Wt 81.6 kg    SpO2 100%   BMI 25.83 kg/m  Physical Exam Vitals and nursing note reviewed.  Constitutional:      General: He is not in acute distress.    Appearance: He is well-developed.  HENT:     Head: Normocephalic and atraumatic.  Eyes:     Conjunctiva/sclera: Conjunctivae normal.  Cardiovascular:     Rate and Rhythm: Normal rate and regular rhythm.     Heart sounds: No murmur heard. Pulmonary:     Effort: Pulmonary effort is normal. No respiratory distress.     Breath sounds: Normal breath sounds.  Abdominal:     Palpations: Abdomen is soft.     Tenderness: There is no abdominal tenderness.  Musculoskeletal:        General: No swelling.     Cervical back: Neck supple.     Comments: Prior surgical incision noted on the dorsal medial aspect of patient's right hand.  Mild swelling over affected area with no obvious erythema, palpable fluctuance or ecchymosis.  Radial pulses full intact bilaterally.  Patient will make okay sign, fist, thumbs up, resist horizontal adduction of digits as well as extend wrist.  Patiently no sensory deficits distally.  Skin:    General: Skin is warm and dry.     Capillary Refill: Capillary refill takes less than 2 seconds.  Neurological:     Mental Status: He is  alert.  Psychiatric:        Mood and Affect: Mood normal.     ED Results / Procedures / Treatments   Labs (all labs ordered are listed, but only abnormal results are displayed) Labs Reviewed  CBG MONITORING, ED    EKG EKG Interpretation  Date/Time:  Wednesday April 11 2022 10:31:06 EST Ventricular Rate:  56 PR Interval:  146 QRS Duration: 90 QT Interval:  420 QTC Calculation: 406 R Axis:   74 Text Interpretation: Sinus rhythm Consider left ventricular hypertrophy ST elev, probable normal early repol pattern No significant change since last tracing Confirmed by Vonita Moss [id number]) on 04/11/2022 10:35:45 AM  Radiology DG Hand Complete Right  Result Date: 04/11/2022 CLINICAL  DATA:  36 year old male with pain and swelling for the past month. EXAM: RIGHT HAND - COMPLETE 3+ VIEW COMPARISON:  Right hand series 08/02/2021. FINDINGS: Remote 5th metacarpal ORIF. Hardware in residual bone deformity appears stable. Background bone marrow signal is within normal limits. Joint spaces and alignment are maintained. Stable probable chronic fracture of the ulnar styloid also. No acute osseous abnormality identified. No discrete soft tissue abnormality. IMPRESSION: No acute radiographic finding. Stable appearance of chronic 5th metacarpal fracture and ORIF. Electronically Signed   By: Odessa Fleming M.D.   On: 04/11/2022 10:42    Procedures Procedures    Medications Ordered in ED Medications  ibuprofen (ADVIL) tablet 600 mg (600 mg Oral Given 04/11/22 1025)    ED Course/ Medical Decision Making/ A&P                           Medical Decision Making Amount and/or Complexity of Data Reviewed Radiology: ordered.  Risk OTC drugs.   This patient presents to the ED for concern of hand pain, this involves an extensive number of treatment options, and is a complaint that carries with it a high risk of complications and morbidity.  The differential diagnosis includes fracture, strain/sprain, dislocation, osteoarthritis, rheumatoid arthritis, septic arthritis   Co morbidities that complicate the patient evaluation  See HPI   Additional history obtained:  Additional history obtained from EMR External records from outside source obtained and reviewed including hospital records   Lab Tests:  I Ordered, and personally interpreted labs.  The pertinent results include: CBG of 97   Imaging Studies ordered:  I ordered imaging studies including right I independently visualized and interpreted imaging which showed no acute osseous abnormality I agree with the radiologist interpretation   Cardiac Monitoring: / EKG:  The patient was maintained on a cardiac monitor.  I personally  viewed and interpreted the cardiac monitored which showed an underlying rhythm of: Sinus rhythm without acute ischemic changes from patient's prior EKGs   Consultations Obtained:  N/a   Problem List / ED Course / Critical interventions / Medication management  Right hand pain I ordered medication including ibuprofen for pain   Reevaluation of the patient after these medicines showed that the patient improved I have reviewed the patients home medicines and have made adjustments as needed   Social Determinants of Health:  Chronic cigarette use.  Denies illicit drug use.   Test / Admission - Considered:  Right hip x-ray Vitals signs within normal range and stable throughout visit. Laboratory/imaging studies significant for: See above Patient without acute abnormalities of right hand.  No evidence of cellulitis, septic arthritis at this time.  Patient with vague complaints overnight of pain and difficulty to wake.  Upon  further evaluation, patient felt like he was in a "deep sleep".  Patient with history of seizures with no evidence of acute seizure per patient history and physical exam.  CBG was checked due to patient's concern of which was in normal range.  Recommended follow-up with hand surgeon for evaluation of affected hand.  Patient recommended symptomatic therapy at home with rest, ice, elevation and NSAID therapy.  Treatment plan discussed at length with the patient and he acknowledged understanding was agreeable to said plan. Worrisome signs and symptoms were discussed with the patient, and the patient acknowledged understanding to return to the ED if noticed. Patient was stable upon discharge.          Final Clinical Impression(s) / ED Diagnoses Final diagnoses:  Right hand pain    Rx / DC Orders ED Discharge Orders          Ordered    ibuprofen (ADVIL) 600 MG tablet  Every 6 hours PRN        04/11/22 1218              Martin Cooper, [place] 04/11/22  1254    Martin Baton, MD 04/15/22 [phone]

## 2022-04-11 NOTE — ED Triage Notes (Signed)
Pt via EMS from home c/o pain and swelling to right hand. He states he also felt dizzy while he was sleeping.

## 2022-04-11 NOTE — ED Provider Triage Note (Signed)
Emergency Medicine Provider Triage Evaluation Note  Martin Cooper , a 36 y.o. male  was evaluated in triage.  Pt complains of right hand pain.  Patient reports right hand pain and swelling for approximately past month.  Reports history of surgery on affected hand.  Has taken only Tylenol/Motrin for pain which has helped some.  Also reports episode of with his partner had difficulty waking him up this morning.  States he was already asleep but felt like he "passed out".  He is concerned about possible "low blood sugar because family history of diabetes ".  Denies fever, chills, night sweats, chest pain, shortness of breath  Review of Systems  Positive: See above Negative:   Physical Exam  BP 125/85 (BP Location: Left Arm)   Pulse (!) 52   Temp 98.4 F (36.9 C) (Oral)   Resp 16   Ht 5\' 10"  (1.778 m)   Wt 81.6 kg   SpO2 100%   BMI 25.83 kg/m  Gen:   Awake, no distress   Resp:  Normal effort  MSK:   Moves extremities without difficulty  Other:  Tender palpation dorsal medial hand.  No obvious erythema, palpable fluctuance.  Medical Decision Making  Medically screening exam initiated at 10:20 AM.  Appropriate orders placed.  LAURIS SERVISS was informed that the remainder of the evaluation will be completed by another provider, this initial triage assessment does not replace that evaluation, and the importance of remaining in the ED until their evaluation is complete.     Flossie Buffy, Peter Garter 04/11/22 1022

## 2022-04-11 NOTE — Discharge Instructions (Addendum)
Note the workup today was overall reassuring.  As discussed, continue symptomatic therapy at home with rest, ice, elevation as well as nonsteroidal was in the form of ibuprofen.  Recommend follow-up with your surgeon for reevaluation of your hand.  Please do not hesitate to return to emergency department if the worrisome signs and symptoms we discussed become apparent.

## 2022-04-11 NOTE — ED Triage Notes (Signed)
Pt via EMS from home c/o near syncopal episode "while he was asleep." Currently reports dizziness, back pain, and right hand pain. A/O x 4  BP 120/87  HR 65 O2 100% RA CBG 110  EKG NSR

## 2022-04-12 ENCOUNTER — Other Ambulatory Visit: Payer: Self-pay

## 2022-04-24 ENCOUNTER — Other Ambulatory Visit: Payer: Self-pay

## 2022-04-25 ENCOUNTER — Encounter (HOSPITAL_COMMUNITY): Payer: Self-pay | Admitting: Emergency Medicine

## 2022-04-25 ENCOUNTER — Emergency Department (HOSPITAL_COMMUNITY): Payer: Medicaid Other

## 2022-04-25 ENCOUNTER — Observation Stay (HOSPITAL_COMMUNITY)
Admission: EM | Admit: 2022-04-25 | Discharge: 2022-04-26 | Disposition: A | Discharge status: Home / Self Care | Payer: Medicaid Other | Attending: Internal Medicine | Admitting: Internal Medicine

## 2022-04-25 DIAGNOSIS — K219 Gastro-esophageal reflux disease without esophagitis: Secondary | ICD-10-CM | POA: Diagnosis not present

## 2022-04-25 DIAGNOSIS — F1910 Other psychoactive substance abuse, uncomplicated: Secondary | ICD-10-CM | POA: Diagnosis not present

## 2022-04-25 DIAGNOSIS — Z720 Tobacco use: Secondary | ICD-10-CM | POA: Diagnosis present

## 2022-04-25 DIAGNOSIS — F17210 Nicotine dependence, cigarettes, uncomplicated: Secondary | ICD-10-CM | POA: Insufficient documentation

## 2022-04-25 DIAGNOSIS — R569 Unspecified convulsions: Secondary | ICD-10-CM | POA: Diagnosis not present

## 2022-04-25 DIAGNOSIS — J45909 Unspecified asthma, uncomplicated: Secondary | ICD-10-CM | POA: Diagnosis not present

## 2022-04-25 DIAGNOSIS — Z79899 Other long term (current) drug therapy: Secondary | ICD-10-CM | POA: Insufficient documentation

## 2022-04-25 LAB — COMPREHENSIVE METABOLIC PANEL
ALT: 26 U/L (ref 0–44)
AST: 29 U/L (ref 15–41)
Albumin: 4.1 g/dL (ref 3.5–5.0)
Alkaline Phosphatase: 63 U/L (ref 38–126)
Anion gap: 6 (ref 5–15)
BUN: 13 mg/dL (ref 6–20)
CO2: 27 mmol/L (ref 22–32)
Calcium: 9.6 mg/dL (ref 8.9–10.3)
Chloride: 104 mmol/L (ref 98–111)
Creatinine, Ser: 1.21 mg/dL (ref 0.61–1.24)
GFR, Estimated: >60 mL/min (ref >60)
Glucose, Bld: 100 mg/dL — ABNORMAL HIGH (ref 70–99)
Potassium: 4.9 mmol/L (ref 3.5–5.1)
Sodium: 137 mmol/L (ref 135–145)
Total Bilirubin: 0.7 mg/dL (ref 0.3–1.2)
Total Protein: 7.8 g/dL (ref 6.5–8.1)

## 2022-04-25 LAB — CBC WITH DIFFERENTIAL/PLATELET
Abs Immature Granulocytes: 0.01 10*3/uL (ref 0.00–0.07)
Basophils Absolute: 0.1 10*3/uL (ref 0.0–0.1)
Basophils Relative: 1 %
Eosinophils Absolute: 0.1 10*3/uL (ref 0.0–0.5)
Eosinophils Relative: 2 %
HCT: 44.3 % (ref 39.0–52.0)
Hemoglobin: 14.6 g/dL (ref 13.0–17.0)
Immature Granulocytes: 0 %
Lymphocytes Relative: 39 %
Lymphs Abs: 2.3 10*3/uL (ref 0.7–4.0)
MCH: 30.7 pg (ref 26.0–34.0)
MCHC: 33 g/dL (ref 30.0–36.0)
MCV: 93.1 fL (ref 80.0–100.0)
Monocytes Absolute: 0.4 10*3/uL (ref 0.1–1.0)
Monocytes Relative: 7 %
Neutro Abs: 3.1 10*3/uL (ref 1.7–7.7)
Neutrophils Relative %: 51 %
Platelets: 343 10*3/uL (ref 150–400)
RBC: 4.76 MIL/uL (ref 4.22–5.81)
RDW: 12.3 % (ref 11.5–15.5)
WBC: 6 10*3/uL (ref 4.0–10.5)
nRBC: 0 % (ref 0.0–0.2)

## 2022-04-25 LAB — ETHANOL: Alcohol, Ethyl (B): <10 mg/dL (ref <10)

## 2022-04-25 LAB — PHENYTOIN LEVEL, TOTAL: Phenytoin Lvl: <2.5 ug/mL — ABNORMAL LOW (ref 10.0–20.0)

## 2022-04-25 MED ORDER — ENOXAPARIN SODIUM 40 MG/0.4ML IJ SOSY
40.0000 mg | PREFILLED_SYRINGE | INTRAMUSCULAR | Status: DC
  Administered 2022-04-25: 40 mg via SUBCUTANEOUS
  Filled 2022-04-25: qty 0.4

## 2022-04-25 MED ORDER — SODIUM CHLORIDE 0.45 % IV SOLN
INTRAVENOUS | Status: DC
  Administered 2022-04-25: 999 mL via INTRAVENOUS

## 2022-04-25 MED ORDER — LEVETIRACETAM IN NACL 1000 MG/100ML IV SOLN: 1000.0000 mg | Freq: Once | INTRAVENOUS | Status: DC

## 2022-04-25 MED ORDER — ACETAMINOPHEN 325 MG PO TABS
650.0000 mg | ORAL_TABLET | Freq: Four times a day (QID) | ORAL | Status: DC | PRN
  Administered 2022-04-25 – 2022-04-26 (×2): 650 mg via ORAL
  Filled 2022-04-25 (×2): qty 2

## 2022-04-25 MED ORDER — LEVETIRACETAM 500 MG PO TABS
1000.0000 mg | ORAL_TABLET | Freq: Two times a day (BID) | ORAL | Status: DC
  Administered 2022-04-25 – 2022-04-26 (×2): 1000 mg via ORAL
  Filled 2022-04-25 (×2): qty 2

## 2022-04-25 MED ORDER — ONDANSETRON HCL 4 MG/2ML IJ SOLN: 4.0000 mg | Freq: Four times a day (QID) | INTRAMUSCULAR | Status: DC | PRN

## 2022-04-25 MED ORDER — SODIUM CHLORIDE 0.9 % IV SOLN
500.0000 mg | Freq: Once | INTRAVENOUS | Status: AC
  Administered 2022-04-25: 500 mg via INTRAVENOUS
  Filled 2022-04-25: qty 10

## 2022-04-25 MED ORDER — PHENYTOIN SODIUM EXTENDED 30 MG PO CAPS
330.0000 mg | ORAL_CAPSULE | Freq: Every day | ORAL | Status: DC
  Administered 2022-04-25: 330 mg via ORAL
  Filled 2022-04-25: qty 1

## 2022-04-25 MED ORDER — LORAZEPAM 2 MG/ML IJ SOLN
1.0000 mg | Freq: Once | INTRAMUSCULAR | Status: AC
  Administered 2022-04-25: 1 mg via INTRAVENOUS
  Filled 2022-04-25: qty 1

## 2022-04-25 MED ORDER — SODIUM CHLORIDE 0.9 % IV SOLN
2000.0000 mg | Freq: Once | INTRAVENOUS | Status: AC
  Administered 2022-04-25: 2000 mg via INTRAVENOUS
  Filled 2022-04-25: qty 20

## 2022-04-25 MED ORDER — ACETAMINOPHEN 650 MG RE SUPP: 650.0000 mg | Freq: Four times a day (QID) | RECTAL | Status: DC | PRN

## 2022-04-25 MED ORDER — ONDANSETRON HCL 4 MG PO TABS: 4.0000 mg | ORAL_TABLET | Freq: Four times a day (QID) | ORAL | Status: DC | PRN

## 2022-04-25 NOTE — Consult Note (Signed)
Neurology Consultation Reason for Consult: Seizures Referring Physician: Sharmon Leyden  CC: Seizures  History is obtained from: Patient  HPI: Martin Cooper is a 37 y.o. male with a history of seizures since childhood who has been managed on Dilantin for a very long time.  He states that he has significant dysarthria at baseline.  He was taking Keppra and Dilantin, but unfortunately lost his primary care doctor and has not been able to establish with another physician and therefore was not taking medications.  He has had multiple seizures over the past 24 hours, but has returned to baseline.  Given the fact that he has had multiple seizures (four) he is being admitted for observation while being restarted on his medications.   Past Medical History:  Diagnosis Date   Asthma    Seizures ([place])      Family History  Problem Relation Age of Onset   Hypertension Mother    Kidney disease Father      Social History:  reports that he has been smoking cigarettes. He has been smoking an average of .5 packs per day. He has never used smokeless tobacco. He reports that he does not currently use alcohol. He reports current drug use. Drugs: Cocaine and Marijuana.   Exam: Current vital signs: BP 112/78 (BP Location: Left Arm)   Pulse 64   Temp 98.8 F (37.1 C) (Oral)   Resp 16   SpO2 100%  Vital signs in last 24 hours: Temp:  [98 F (36.7 C)-98.8 F (37.1 C)] 98.8 F (37.1 C) (01/10 1912) Pulse Rate:  [54-67] 64 (01/10 1912) Resp:  [11-18] 16 (01/10 1912) BP: (107-126)/(59-83) 112/78 (01/10 1912) SpO2:  [100 %] 100 % (01/10 1912)   Physical Exam  Appears well-developed and well-nourished.   Neuro: Mental Status: Patient is awake, alert, oriented to person, place, month, year, and situation.  He is dysarthric Patient is able to give a clear and coherent history. No signs of aphasia or neglect Cranial Nerves: II: Visual Fields are full. Pupils are equal, round, and reactive to light.    III,IV, VI: EOMI without ptosis or diploplia.  V: Facial sensation is symmetric to temperature VII: Facial movement is symmetric.  VIII: hearing is intact to voice X: Uvula elevates symmetrically XI: Shoulder shrug is symmetric. XII: tongue is midline without atrophy or fasciculations.  Motor: Tone is normal. Bulk is normal. 5/5 strength was present in all four extremities.  Sensory: Sensation is symmetric to light touch and temperature in the arms and legs. Cerebellar: No clear ataxia     I have reviewed labs in epic and the results pertinent to this consultation are: Creatinine 1.2 UDS-pending Na 137 Ca 9.6  I have reviewed the images obtained: CT head-negative  Impression: 37 year old male with breakthrough seizures in setting of medication noncompliance.  He will need to be restarted on his medications, as long as he does not have any further seizures, could likely be discharged tomorrow morning.  Recommendations: 1) restart home Keppra 1 g twice daily 2) restart home Dilantin 330 mg nightly, will also give additional 500 mg IV load 3) if no further seizures, could be discharged first thing tomorrow.   Roland Rack, MD [facility] [phone]  If 7pm- 7am, please page neurology on call as listed in [place].

## 2022-04-25 NOTE — ED Triage Notes (Signed)
Pt here from home with c/o seizure ,times 2 today , pt feels like he had a seizure earlier today , and had one witnessed by ems , pt received 5 mg versed Im by ems , pt arrives to the ED alert and oriented

## 2022-04-25 NOTE — ED Provider Notes (Signed)
[facility]-EMERGENCY DEPT Provider Note   CSN: [id number] Arrival date & time: 04/25/22  1058     History  Chief Complaint  Patient presents with   Seizures    Martin Cooper is a 37 y.o. male.  Patient states he had a seizure yesterday and then he had a seizure at home today along with another 1 in the ambulance and 1 when he got to the emergency department.  Patient has not been taking his Dilantin and has taken his Keppra sporadically.  He had 1 dose yesterday  The history is provided by the patient and medical records. No language interpreter was used.  Seizures Seizure activity on arrival: yes   Seizure type:  Grand mal Preceding symptoms: no sensation of an aura present   Initial focality:  None Episode characteristics: abnormal movements   Postictal symptoms: confusion   Return to baseline: yes   Severity:  Moderate Timing:  Clustered      Home Medications Prior to Admission medications   Medication Sig Start Date End Date Taking? Authorizing Provider  ibuprofen (ADVIL) 600 MG tablet Take 1 tablet (600 mg total) by mouth every 6 (six) hours as needed. 04/11/22   Peter Garter, PA  levETIRAcetam (KEPPRA) 500 MG tablet Take 2 tablets (1,000 mg total) by mouth 2 (two) times daily. 09/17/21 10/20/21  Shaune Pollack, MD  phenytoin (DILANTIN) 30 MG ER capsule Take 1 capsule (30 mg total) by mouth at bedtime. 09/17/21 10/20/21  Shaune Pollack, MD  phenytoin (DILANTIN) 100 MG ER capsule Take 3 capsules (300 mg total) by mouth at bedtime. 09/17/21 10/21/21  Shaune Pollack, MD      Allergies    Patient has no known allergies.    Review of Systems   Review of Systems  Constitutional:  Negative for appetite change and fatigue.  HENT:  Negative for congestion, ear discharge and sinus pressure.   Eyes:  Negative for discharge.  Respiratory:  Negative for cough.   Cardiovascular:  Negative for chest pain.  Gastrointestinal:  Negative for abdominal pain and  diarrhea.  Genitourinary:  Negative for frequency and hematuria.  Musculoskeletal:  Negative for back pain.  Skin:  Negative for rash.  Neurological:  Positive for seizures. Negative for headaches.  Psychiatric/Behavioral:  Negative for hallucinations.     Physical Exam Updated Vital Signs BP 126/83   Pulse (!) 54   Resp 11   SpO2 100%  Physical Exam Vitals and nursing note reviewed.  Constitutional:      Appearance: He is well-developed.  HENT:     Head: Normocephalic.     Nose: Nose normal.  Eyes:     General: No scleral icterus.    Conjunctiva/sclera: Conjunctivae normal.  Neck:     Thyroid: No thyromegaly.  Cardiovascular:     Rate and Rhythm: Normal rate and regular rhythm.     Heart sounds: No murmur heard.    No friction rub. No gallop.  Pulmonary:     Breath sounds: No stridor. No wheezing or rales.  Chest:     Chest wall: No tenderness.  Abdominal:     General: There is no distension.     Tenderness: There is no abdominal tenderness. There is no rebound.  Musculoskeletal:        General: Normal range of motion.     Cervical back: Neck supple.  Lymphadenopathy:     Cervical: No cervical adenopathy.  Skin:    Findings: No erythema or rash.  Neurological:     Mental Status: He is alert and oriented to person, place, and time.     Motor: No abnormal muscle tone.     Coordination: Coordination normal.  Psychiatric:        Behavior: Behavior normal.     ED Results / Procedures / Treatments   Labs (all labs ordered are listed, but only abnormal results are displayed) Labs Reviewed  COMPREHENSIVE METABOLIC PANEL - Abnormal; Notable for the following components:      Result Value   Glucose, Bld 100 (*)    All other components within normal limits  PHENYTOIN LEVEL, TOTAL - Abnormal; Notable for the following components:   Phenytoin Lvl <2.5 (*)    All other components within normal limits  CBC WITH DIFFERENTIAL/PLATELET  ETHANOL  RAPID URINE DRUG  SCREEN, HOSP PERFORMED    EKG None  Radiology CT Head Wo Contrast  Result Date: 04/25/2022 CLINICAL DATA:  New onset seizure. EXAM: CT HEAD WITHOUT CONTRAST TECHNIQUE: Contiguous axial images were obtained from the base of the skull through the vertex without intravenous contrast. RADIATION DOSE REDUCTION: This exam was performed according to the departmental dose-optimization program which includes automated exposure control, adjustment of the mA and/or kV according to patient size and/or use of iterative reconstruction technique. COMPARISON:  06/12/2021. FINDINGS: Brain: No evidence of acute infarction, hemorrhage, hydrocephalus, extra-axial collection or mass lesion/mass effect. Vascular: No hyperdense vessel or unexpected calcification. Skull: Normal. Negative for fracture or focal lesion. Sinuses/Orbits: Globes and orbits are unremarkable. Visualized sinuses are clear. Other: None. IMPRESSION: Normal unenhanced CT scan of the brain. Electronically Signed   By: Lajean Manes M.D.   On: 04/25/2022 12:57    Procedures Procedures    Medications Ordered in ED Medications  LORazepam (ATIVAN) injection 1 mg (1 mg Intravenous Given 04/25/22 1132)  levETIRAcetam (KEPPRA) 2,000 mg in sodium chloride 0.9 % 250 mL IVPB (0 mg Intravenous Stopped 04/25/22 1219)    ED Course/ Medical Decision Making/ A&P  CRITICAL CARE Performed by: Milton Ferguson Total critical care time: 45 minutes Critical care time was exclusive of separately billable procedures and treating other patients. Critical care was necessary to treat or prevent imminent or life-threatening deterioration. Critical care was time spent personally by me on the following activities: development of treatment plan with patient and/or surrogate as well as nursing, discussions with consultants, evaluation of patient's response to treatment, examination of patient, obtaining history from patient or surrogate, ordering and performing treatments and  interventions, ordering and review of laboratory studies, ordering and review of radiographic studies, pulse oximetry and re-evaluation of patient's condition.                          Medical Decision Making Amount and/or Complexity of Data Reviewed Labs: ordered. Radiology: ordered.  Risk Prescription drug management. Decision regarding hospitalization.  This patient presents to the ED for concern of seizures, this involves an extensive number of treatment options, and is a complaint that carries with it a high risk of complications and morbidity.  The differential diagnosis includes compliant on medications for seizures   Co morbidities that complicate the patient evaluation  Seizures   Additional history obtained:  Additional history obtained from patient External records from outside source obtained and reviewed including hospital records   Lab Tests:  I Ordered, and personally interpreted labs.  The pertinent results include: BC and chemistries negative,   Imaging Studies ordered:  I ordered imaging  studies including the head I independently visualized and interpreted imaging which showed unremarkable I agree with the radiologist interpretation   Cardiac Monitoring: / EKG:  The patient was maintained on a cardiac monitor.  I personally viewed and interpreted the cardiac monitored which showed an underlying rhythm of: Normal sinus rhythm   Consultations Obtained:  I requested consultation with the neurology,  and discussed lab and imaging findings as well as pertinent plan - they recommend: Admit to medicine with neurology consult   Problem List / ED Course / Critical interventions / Medication management  Seizures I ordered medication including Keppra for seizures Reevaluation of the patient after these medicines showed that the patient improved I have reviewed the patients home medicines and have made adjustments as needed   Social Determinants of  Health:  None   Test / Admission - Considered:  None  Patient with seizures and poor compliance on medications.  Patient had multiple seizures will be admitted and started back on his medicine with neurology consult        Final Clinical Impression(s) / ED Diagnoses Final diagnoses:  Seizure [facility])    Rx / DC Orders ED Discharge Orders     None         Milton Ferguson, MD 04/28/22 [phone]

## 2022-04-25 NOTE — H&P (Signed)
History and Physical    Patient: Martin Cooper [id number]:[id number] DOB: 1985-10-02 DOA: 04/25/2022 DOS: the patient was seen and examined on 04/25/2022 PCP: Pcp, No  Patient coming from: Home  Chief Complaint:  Chief Complaint  Patient presents with   Seizures   HPI: Martin Cooper is a 37 y.o. male with medical history significant of asthma, seizure disorder who was brought to the emergency department via EMS after having 2 seizures earlier today 1 witnessed by EMS.  He received midazolam 5 mg IVP from EMS.  He had another seizure in the emergency department.  He stated he has stopped taking his medications.  Denied any recent alcohol or recreational drug use. He denied fever, chills, rhinorrhea, sore throat, wheezing or hemoptysis.  No chest pain, palpitations, diaphoresis, PND, orthopnea or pitting edema of the lower extremities.  No abdominal pain, nausea, emesis, diarrhea, constipation, melena or hematochezia.  No flank pain, dysuria, frequency or hematuria.  No polyuria, polydipsia, polyphagia or blurred vision.   ED course: Initial vital signs were temperature pulse 51, respiratory rate 11, BP 126/83 mmHg and O2 sat 100% on room air.  The patient received lorazepam 1 mg IVP and 2000 mg of Keppra IVPB.  Neurology order 500 mg of fosphenytoin.  Lab work: His CBC was normal.  CMP showed a glucose of 100 mg/dL, but was otherwise unremarkable.  Normal alcohol level.  Phenytoin level was undetectable.  Imaging: CT head without contrast was normal.   Review of Systems: As mentioned in the history of present illness. All other systems reviewed and are negative.  Past Medical History:  Diagnosis Date   Asthma    Seizures ([place])    History reviewed. No pertinent surgical history. Social History:  reports that he has been smoking cigarettes. He has been smoking an average of .5 packs per day. He has never used smokeless tobacco. He reports that he does not currently use alcohol. He reports current  drug use. Drugs: Cocaine and Marijuana.  No Known Allergies  Family History  Problem Relation Age of Onset   Hypertension Mother    Kidney disease Father     Prior to Admission medications   Medication Sig Start Date End Date Taking? Authorizing Provider  ibuprofen (ADVIL) 600 MG tablet Take 1 tablet (600 mg total) by mouth every 6 (six) hours as needed. 04/11/22   Wilnette Kales, PA  levETIRAcetam (KEPPRA) 500 MG tablet Take 2 tablets (1,000 mg total) by mouth 2 (two) times daily. 09/17/21 10/20/21  Duffy Bruce, MD  phenytoin (DILANTIN) 30 MG ER capsule Take 1 capsule (30 mg total) by mouth at bedtime. 09/17/21 10/20/21  Duffy Bruce, MD  phenytoin (DILANTIN) 100 MG ER capsule Take 3 capsules (300 mg total) by mouth at bedtime. 09/17/21 10/21/21  Duffy Bruce, MD    Physical Exam: Vitals:   04/25/22 1430  BP: 126/83  Pulse: (!) 54  Resp: 11  SpO2: 100%   Physical Exam Vitals and nursing note reviewed.  Constitutional:      General: He is not in acute distress.    Appearance: Normal appearance. He is not ill-appearing.     Comments: Somnolent, but wakes up easily.  Oriented x 3 and answering questions appropriately.  HENT:     Head: Normocephalic.     Nose: No rhinorrhea.     Mouth/Throat:     Mouth: Mucous membranes are moist.  Eyes:     General: No scleral icterus.    Pupils: Pupils are  equal, round, and reactive to light.  Neck:     Vascular: No JVD.  Cardiovascular:     Rate and Rhythm: Normal rate and regular rhythm.     Heart sounds: S1 normal and S2 normal.  Pulmonary:     Effort: Pulmonary effort is normal.     Breath sounds: Normal breath sounds. No wheezing, rhonchi or rales.  Abdominal:     General: Bowel sounds are normal. There is no distension.     Palpations: Abdomen is soft.     Tenderness: There is no abdominal tenderness.  Musculoskeletal:     Cervical back: Neck supple.     Right lower leg: No edema.     Left lower leg: No edema.  Skin:     General: Skin is warm and dry.  Neurological:     General: No focal deficit present.     Mental Status: He is oriented to person, place, and time and easily aroused.  Psychiatric:        Mood and Affect: Mood normal.        Behavior: Behavior normal. Behavior is cooperative.   Data Reviewed:  There are no new results to review at this time.  Assessment and Plan: Principal Problem:   Seizures ([place]) Observation/telemetry. Frequent neurochecks. Loaded with Keppra and Cerebyx. Neurology recommendation appreciated. Will resume home meds per neurology recommendation.  Active Problems:   Asthma No symptoms at this time. Bronchodilators as needed. Tobacco use cessation.    Tobacco abuse Tobacco cessation advised. Nicotine replacement therapy declined.    GERD (gastroesophageal reflux disease) No symptoms at this time. Antiacids, H2 blocker or PPI as needed.    Polysubstance abuse ([place]) An UDS has been previously ordered. We will be following results.     Advance Care Planning:   Code Status: Full Code   Consults: Neurology Kathrynn Speed, MD)  Family Communication:   Severity of Illness: The appropriate patient status for this patient is OBSERVATION. Observation status is judged to be reasonable and necessary in order to provide the required intensity of service to ensure the patient's safety. The patient's presenting symptoms, physical exam findings, and initial radiographic and laboratory data in the context of their medical condition is felt to place them at decreased risk for further clinical deterioration. Furthermore, it is anticipated that the patient will be medically stable for discharge from the hospital within 2 midnights of admission.   Author: Reubin Milan, MD 04/25/2022 3:47 PM  For on call review [email or web address].   This document was prepared using Dragon voice recognition software and may contain some unintended transcription errors.

## 2022-04-26 ENCOUNTER — Other Ambulatory Visit (HOSPITAL_COMMUNITY): Payer: Self-pay

## 2022-04-26 ENCOUNTER — Observation Stay (HOSPITAL_COMMUNITY): Payer: Medicaid Other

## 2022-04-26 DIAGNOSIS — R569 Unspecified convulsions: Secondary | ICD-10-CM | POA: Diagnosis not present

## 2022-04-26 MED ORDER — PHENYTOIN SODIUM EXTENDED 100 MG PO CAPS
300.0000 mg | ORAL_CAPSULE | Freq: Every day | ORAL | 0 refills | Status: DC
  Filled 2022-04-26: qty 90, 30d supply, fill #0

## 2022-04-26 MED ORDER — IBUPROFEN 600 MG PO TABS: 600.0000 mg | ORAL_TABLET | Freq: Three times a day (TID) | ORAL | 0 refills | Status: AC

## 2022-04-26 MED ORDER — ORAL CARE MOUTH RINSE: 15.0000 mL | OROMUCOSAL | Status: DC | PRN

## 2022-04-26 MED ORDER — PHENYTOIN SODIUM EXTENDED 30 MG PO CAPS
30.0000 mg | ORAL_CAPSULE | Freq: Every day | ORAL | 0 refills | Status: DC
  Filled 2022-04-26: qty 30, 30d supply, fill #0

## 2022-04-26 MED ORDER — IBUPROFEN 200 MG PO TABS
600.0000 mg | ORAL_TABLET | Freq: Three times a day (TID) | ORAL | Status: DC
  Administered 2022-04-26: 600 mg via ORAL
  Filled 2022-04-26: qty 3

## 2022-04-26 MED ORDER — LEVETIRACETAM 500 MG PO TABS
1000.0000 mg | ORAL_TABLET | Freq: Two times a day (BID) | ORAL | 0 refills | Status: DC
  Filled 2022-04-26: qty 120, 30d supply, fill #0

## 2022-04-26 NOTE — TOC Transition Note (Signed)
Transition of Care [facility]) - CM/SW Discharge Note   Patient Details  Name: Martin Cooper MRN: [id number] Date of Birth: Nov 20, 1985  Transition of Care [facility]) CM/SW Contact:  Vassie Moselle, LCSW Phone Number: 04/26/2022, 11:13 AM   Clinical Narrative:   Met with pt in room and confirmed he has no PCP. Pt is agreeable to PCP appointment being scheduled. Appointment scheduled at [facility] on 05/08/22 at 10:15am.  Pt shares he moved to [place] from [facility] over a year ago. His Medicaid [place] currently [place]of-[place]. Pt shares his girlfriend has tried to help get it switched and is still working on this. CSW encouraged pt to go to the [facility] office in person to have Medicaid changed from [facility] to [place]. Pt asked if CSW could assist him in getting a phone. CSW shared that the hospital is unable to assist with obtaining phones but, if he were to go to [facility] he could inquire regarding programs/agencies that may be able to assist.  Pt also requesting crutches and states that he is in pain and does not think he will be able to bear weight on ankle. After discussing with MD pt has no medical reasons that justify need for crutches. After chart review MD also found that patient received crutches from [facility] 09/2021. Pt also has had 2 orders of crutches in 2022 and 2 in 2023.    Final next level of care: Home/Self Care Barriers to Discharge: No Barriers Identified   Patient Goals and CMS Choice CMS Medicare.gov Compare Post Acute Care list provided to:: Patient Choice offered to / list presented to : Patient  Discharge Placement                         Discharge Plan and Services Additional resources added to the After Visit Summary for                  DME Arranged: N/A DME Agency: NA                  Social Determinants of Health (SDOH) Interventions SDOH Screenings   Tobacco Use: High Risk (04/25/2022)     Readmission Risk Interventions     No data to display

## 2022-04-26 NOTE — ED Notes (Signed)
Wasted 1mg  of Ativan witnessed by Pricilla Loveless RN and Paulette Blanch RN.

## 2022-04-27 ENCOUNTER — Emergency Department (HOSPITAL_COMMUNITY): Payer: Medicaid Other

## 2022-04-27 ENCOUNTER — Other Ambulatory Visit (HOSPITAL_COMMUNITY): Payer: Self-pay

## 2022-04-27 ENCOUNTER — Emergency Department (HOSPITAL_COMMUNITY)
Admission: EM | Admit: 2022-04-27 | Discharge: 2022-04-27 | Disposition: A | Discharge status: Home / Self Care | Payer: Medicaid Other | Attending: Emergency Medicine | Admitting: Emergency Medicine

## 2022-04-27 DIAGNOSIS — F1910 Other psychoactive substance abuse, uncomplicated: Secondary | ICD-10-CM

## 2022-04-27 DIAGNOSIS — R079 Chest pain, unspecified: Secondary | ICD-10-CM

## 2022-04-27 DIAGNOSIS — Z20822 Contact with and (suspected) exposure to covid-19: Secondary | ICD-10-CM | POA: Insufficient documentation

## 2022-04-27 DIAGNOSIS — R569 Unspecified convulsions: Secondary | ICD-10-CM | POA: Diagnosis not present

## 2022-04-27 LAB — CBC WITH DIFFERENTIAL/PLATELET
Abs Immature Granulocytes: 0 10*3/uL (ref 0.00–0.07)
Basophils Absolute: 0.1 10*3/uL (ref 0.0–0.1)
Basophils Relative: 1 %
Eosinophils Absolute: 0.2 10*3/uL (ref 0.0–0.5)
Eosinophils Relative: 3 %
HCT: 46.9 % (ref 39.0–52.0)
Hemoglobin: 16 g/dL (ref 13.0–17.0)
Immature Granulocytes: 0 %
Lymphocytes Relative: 43 %
Lymphs Abs: 2.9 10*3/uL (ref 0.7–4.0)
MCH: 31.3 pg (ref 26.0–34.0)
MCHC: 34.1 g/dL (ref 30.0–36.0)
MCV: 91.8 fL (ref 80.0–100.0)
Monocytes Absolute: 0.6 10*3/uL (ref 0.1–1.0)
Monocytes Relative: 8 %
Neutro Abs: 3.1 10*3/uL (ref 1.7–7.7)
Neutrophils Relative %: 45 %
Platelets: 355 10*3/uL (ref 150–400)
RBC: 5.11 MIL/uL (ref 4.22–5.81)
RDW: 12 % (ref 11.5–15.5)
WBC: 6.9 10*3/uL (ref 4.0–10.5)
nRBC: 0 % (ref 0.0–0.2)

## 2022-04-27 LAB — COMPREHENSIVE METABOLIC PANEL
ALT: 19 U/L (ref 0–44)
AST: 33 U/L (ref 15–41)
Albumin: 4.2 g/dL (ref 3.5–5.0)
Alkaline Phosphatase: 64 U/L (ref 38–126)
Anion gap: 13 (ref 5–15)
BUN: 10 mg/dL (ref 6–20)
CO2: 21 mmol/L — ABNORMAL LOW (ref 22–32)
Calcium: 9.5 mg/dL (ref 8.9–10.3)
Chloride: 103 mmol/L (ref 98–111)
Creatinine, Ser: 1.25 mg/dL — ABNORMAL HIGH (ref 0.61–1.24)
GFR, Estimated: >60 mL/min (ref >60)
Glucose, Bld: 80 mg/dL (ref 70–99)
Potassium: 4.3 mmol/L (ref 3.5–5.1)
Sodium: 137 mmol/L (ref 135–145)
Total Bilirubin: 0.4 mg/dL (ref 0.3–1.2)
Total Protein: 7.2 g/dL (ref 6.5–8.1)

## 2022-04-27 LAB — RESP PANEL BY RT-PCR (RSV, FLU A&B, COVID)  RVPGX2
Influenza A by PCR: NEGATIVE
Influenza B by PCR: NEGATIVE
Resp Syncytial Virus by PCR: NEGATIVE
SARS Coronavirus 2 by RT PCR: NEGATIVE

## 2022-04-27 LAB — RAPID URINE DRUG SCREEN, HOSP PERFORMED
Amphetamines: NOT DETECTED
Barbiturates: NOT DETECTED
Benzodiazepines: POSITIVE — AB
Cocaine: POSITIVE — AB
Opiates: NOT DETECTED
Tetrahydrocannabinol: POSITIVE — AB

## 2022-04-27 LAB — PHENYTOIN LEVEL, TOTAL: Phenytoin Lvl: 3.9 ug/mL — ABNORMAL LOW (ref 10.0–20.0)

## 2022-04-27 LAB — TROPONIN I (HIGH SENSITIVITY): Troponin I (High Sensitivity): 7 ng/L (ref <18)

## 2022-04-27 LAB — MAGNESIUM: Magnesium: 2.2 mg/dL (ref 1.7–2.4)

## 2022-04-27 LAB — CBG MONITORING, ED: Glucose-Capillary: 88 mg/dL (ref 70–99)

## 2022-04-27 MED ORDER — ACETAMINOPHEN 325 MG PO TABS
650.0000 mg | ORAL_TABLET | Freq: Once | ORAL | Status: AC
  Administered 2022-04-27: 650 mg via ORAL
  Filled 2022-04-27: qty 2

## 2022-04-27 MED ORDER — SODIUM CHLORIDE 0.9 % IV SOLN
15.0000 mg/kg | Freq: Once | INTRAVENOUS | Status: DC
  Filled 2022-04-27: qty 24.48

## 2022-04-27 MED ORDER — LEVETIRACETAM IN NACL 1500 MG/100ML IV SOLN
1500.0000 mg | Freq: Once | INTRAVENOUS | Status: AC
  Administered 2022-04-27: 1500 mg via INTRAVENOUS
  Filled 2022-04-27 (×2): qty 100

## 2022-04-27 NOTE — Discharge Instructions (Addendum)
It is very important for you to take your seizure medicines.  They are available for you to pick up at   [facility]. [address], [place] [address]   You were given an IV dose of your medicines but need to start on your oral meds tonight.  Otherwise follow-up with your neurologist in regards to your seizures.  Stop using cocaine.

## 2022-04-27 NOTE — ED Notes (Signed)
Attempting to start new IV after other IV infiltrated

## 2022-04-27 NOTE — ED Triage Notes (Signed)
Pt BIB EMS from home c/o substernal chest pain, had flu like symptoms for about a week. Seen at [facility] yesterday for seizure activity. Aox4. Given 325 ASA w/ EMS.

## 2022-04-27 NOTE — ED Provider Notes (Signed)
[facility] EMERGENCY DEPARTMENT Provider Note   CSN: [id number] Arrival date & time: 04/27/22  1006     History  Chief Complaint  Patient presents with   Chest Pain    Martin Cooper is a 37 y.o. male.  HPI 37 year old male presents with a chief complaint of chest pain.  Patient presents via EMS.  He was admitted to [facility] and discharged yesterday afternoon for seizures.  He was given meds while in the hospital but was discharged around 1 PM and states he has not taken any of his meds since as he has not yet filled them and was previously out.  He had a seizure when he got home and then after the seizure has been having chest pain.  Feels like someone is punching him in the chest.  That is typically a feeling he gets after having seizures and he often gets chest pain.  His girlfriend told him he had a seizure in his sleep this morning.  Yesterday he felt warm but did not check his temperature.  He denies a significant cough.  He does feel short of breath.  Home Medications Prior to Admission medications   Medication Sig Start Date End Date Taking? Authorizing Provider  ibuprofen (ADVIL) 600 MG tablet Take 1 tablet (600 mg total) by mouth 3 (three) times daily with meals for 2 days. 04/26/22 04/28/22 Yes Rolly Salter, MD  levETIRAcetam (KEPPRA) 500 MG tablet Take 2 tablets (1,000 mg total) by mouth 2 (two) times daily. Patient not taking: Reported on 04/27/2022 04/26/22 05/27/22  Rolly Salter, MD  phenytoin (DILANTIN) 100 MG ER capsule Take 3 capsules (300 mg total) by mouth at bedtime. Patient not taking: Reported on 04/27/2022 04/26/22 05/27/22  Rolly Salter, MD  phenytoin (DILANTIN) 30 MG ER capsule Take 1 capsule (30 mg total) by mouth at bedtime. Patient not taking: Reported on 04/27/2022 04/26/22 05/27/22  Rolly Salter, MD      Allergies    Patient has no known allergies.    Review of Systems   Review of Systems  Constitutional:  Positive for fever  (subjective).  HENT:  Negative for congestion.   Respiratory:  Positive for shortness of breath. Negative for cough.   Cardiovascular:  Positive for chest pain. Negative for leg swelling.  Neurological:  Positive for seizures. Negative for headaches.    Physical Exam Updated Vital Signs BP 128/78   Pulse 80   Temp 97.8 F (36.6 C) (Oral)   Resp (!) 22   Wt 81.6 kg   SpO2 99%   BMI 25.83 kg/m  Physical Exam Vitals and nursing note reviewed.  Constitutional:      General: He is not in acute distress.    Appearance: He is well-developed. He is not ill-appearing or diaphoretic.  HENT:     Head: Normocephalic and atraumatic.  Cardiovascular:     Rate and Rhythm: Normal rate and regular rhythm.     Heart sounds: Normal heart sounds.  Pulmonary:     Effort: Pulmonary effort is normal.     Breath sounds: Normal breath sounds.  Abdominal:     Palpations: Abdomen is soft.     Tenderness: There is no abdominal tenderness.  Skin:    General: Skin is warm and dry.  Neurological:     Mental Status: He is alert.     Comments: Awake, alert, oriented to person, place, month. He's not sure what year it is  or what the date is, but tells me he's usually disoriented after a seizure. CN 3-12 grossly intact. 5/5 strength in all 4 extremities. Grossly normal sensation. Normal finger to nose.      ED Results / Procedures / Treatments   Labs (all labs ordered are listed, but only abnormal results are displayed) Labs Reviewed  COMPREHENSIVE METABOLIC PANEL - Abnormal; Notable for the following components:      Result Value   CO2 21 (*)    Creatinine, Ser 1.25 (*)    All other components within normal limits  RAPID URINE DRUG SCREEN, HOSP PERFORMED - Abnormal; Notable for the following components:   Cocaine POSITIVE (*)    Benzodiazepines POSITIVE (*)    Tetrahydrocannabinol POSITIVE (*)    All other components within normal limits  PHENYTOIN LEVEL, TOTAL - Abnormal; Notable for the  following components:   Phenytoin Lvl 3.9 (*)    All other components within normal limits  RESP PANEL BY RT-PCR (RSV, FLU A&B, COVID)  RVPGX2  CBC WITH DIFFERENTIAL/PLATELET  MAGNESIUM  CBG MONITORING, ED  TROPONIN I (HIGH SENSITIVITY)    EKG EKG Interpretation  Date/Time:  Friday April 27 2022 10:15:20 EST Ventricular Rate:  59 PR Interval:  155 QRS Duration: 97 QT Interval:  413 QTC Calculation: 410 R Axis:   72 Text Interpretation: Sinus rhythm Probable left ventricular hypertrophy  ST elevations similar to Apr 11 2022 Confirmed by Sherwood Gambler [id number]) on 04/27/2022 10:25:55 AM  Radiology DG Chest Portable 1 View  Result Date: 04/27/2022 CLINICAL DATA:  Substernal chest pain. Flu like symptoms for 1 week. EXAM: PORTABLE CHEST 1 VIEW COMPARISON:  08/02/2021 FINDINGS: The heart size and mediastinal contours are within normal limits. Both lungs are clear. The visualized skeletal structures are unremarkable. IMPRESSION: No active disease. Electronically Signed   By: Nolon Nations M.D.   On: 04/27/2022 10:38   DG Ankle Right Port  Result Date: 04/26/2022 CLINICAL DATA:  Trauma, fall, pain EXAM: PORTABLE RIGHT ANKLE - 2 VIEW COMPARISON:  03/19/2022 FINDINGS: No recent fracture or dislocation is seen. Small smooth marginated calcification in the dorsal aspect of talonavicular joint has not changed suggesting possible old avulsion. IMPRESSION: No recent fracture or dislocation is seen in right ankle. Electronically Signed   By: Elmer Picker M.D.   On: 04/26/2022 10:45    Procedures Procedures    Medications Ordered in ED Medications  fosPHENYtoin (CEREBYX) 1,224 mg PE in sodium chloride 0.9 % 50 mL IVPB (has no administration in time range)  acetaminophen (TYLENOL) tablet 650 mg (650 mg Oral Given 04/27/22 1105)  levETIRAcetam (KEPPRA) IVPB 1500 mg/ 100 mL premix (1,500 mg Intravenous New Bag/Given 04/27/22 1431)    ED Course/ Medical Decision Making/ A&P                            Medical Decision Making Amount and/or Complexity of Data Reviewed External Data Reviewed: notes.    Details: Notes from [facility] hospitalization. Labs: ordered.    Details: Troponin normal.  UDS positive for cocaine which is a chronic problem for him.  Phenytoin is detectable but still subtherapeutic. Radiology: ordered and independent interpretation performed.    Details: No pneumonia or pneumothorax ECG/medicine tests: independent interpretation performed.    Details: No acute ischemia  Risk OTC drugs. Prescription drug management.   Patient presents with chest pain which he states typically happens whenever he has seizures.  He reports 2 seizures in  the last 24 hours since leaving [facility].  Overall his workup is unremarkable including a negative troponin and given constant chest pain since last night I do not think he needs a second.  Have low suspicion for ACS, PE, dissection.  As far as his seizure disorder I do not think he needs a head CT given he is not altered at this time.  Neuroexam is unremarkable.  I discussed with Dr. Leonel Ramsay who saw in consultation a few days ago and he recommends 15 mg/kg of fosphenytoin as well as a 1500 mg IV Keppra load and then after that he should be good for discharge and to start his meds.  I instructed him that his meds were sent to the [facility] and he can go there to pick them up and start them tonight.  Otherwise he appears stable for discharge after receiving his meds.        Final Clinical Impression(s) / ED Diagnoses Final diagnoses:  Seizure ([place])  Nonspecific chest pain  Polysubstance abuse [facility])    Rx / DC Orders ED Discharge Orders     None         Sherwood Gambler, MD 04/27/22 1510

## 2022-04-27 NOTE — ED Notes (Addendum)
Pt refused medication and vitals and wanted to leave. MD Regenia Skeeter aware. MD put pt up for discharge.

## 2022-04-28 NOTE — Discharge Summary (Signed)
Physician Discharge Summary   Patient: Martin Cooper MRN: [id number] DOB: [birth year]  Admit date:     04/25/2022  Discharge date: 04/26/2022  Discharge Physician: Berle Mull  PCP: Pcp, No  Recommendations at discharge: Follow-up with PCP.  Follow-up with urology as recommended. Patient was encouraged to remain compliant with follow-ups.   Follow-up Information     Vladimir Crofts, MD. Schedule an appointment as soon as possible for a visit in 1 week(s).   Specialty: Neurology Contact information: [facility] [place] [address] [place]. Go on 05/08/2022.   Why: A primary care appointment has been scheduled for you on 05/08/2022 at 10:15am. Please arrive 15 minutes early to complete new patient paperwork. Contact information: [address] [place], [place]. Go to.   Specialty: [place] Why: Please call or go to [facility] to have Medicaid changed to . Contact information: [place] [address] [phone]                Discharge Diagnoses: Principal Problem:   Seizures ([place]) Active Problems:   Asthma   Tobacco abuse   GERD (gastroesophageal reflux disease)   Polysubstance abuse ([place])  Assessment and Plan  Recurrent seizure disorder. Patient has history of seizures. Patient is on multiple medications for the seizures. Patient ran out of medications and had witnessed multiple seizures. Patient has not followed up with neurology recently. Patient has out-of-[place] Medicaid, have been informed with regards to need for transitioning to [facility] Medicaid in the past so that his medication can be provided. Currently prescription [facility] patient can pick up the prescription. TOC help appreciated.  Right ankle  pain. Patient has complaint of right ankle pain and tells me that he is not able to walk or put any pressure on his ankle. X-ray of the ankle unremarkable for any acute abnormality. Patient reports that her ankle pain started 2 days ago after a fall. Patient has similar complaints of right ankle pain in the past.  He has 17 x-ray of his right ankle since 2006 and 8 in last 2 years. Patient requested to have crutches to go home with but he has received 5 crutches in the last 2 years from ED visits. At present patient does not appear to have any acute fracture. Patient has been reported to be ambulating on his own. I would recommend take ibuprofen. Patient does not require crutches on discharge for now.  Substance abuse history. UDS positive for amphetamine, cocaine and cannabis in the past. Patient was counseled to quit abusing substances. Denies any alcohol abuse.  Consultants:  Neurology  Procedures performed:  None  DISCHARGE MEDICATION: Allergies as of 04/26/2022   No Known Allergies      Medication List     TAKE these medications    ibuprofen 600 MG tablet Commonly known as: ADVIL Take 1 tablet (600 mg total) by mouth 3 (three) times daily with meals for 2 days. What changed:  when to take this reasons to take this   levETIRAcetam 500 MG tablet Commonly known as: Keppra Take 2 tablets (1,000 mg total) by mouth 2 (two) times daily.   phenytoin 100 MG ER capsule Commonly known as: Dilantin Take 3 capsules (300 mg total) by mouth at  bedtime.   phenytoin 30 MG ER capsule Commonly known as: Dilantin Take 1 capsule (30 mg total) by mouth at bedtime.       Disposition: Home Diet recommendation: Regular diet  Discharge Exam: Vitals:   04/25/22 2310 04/26/22 0315 04/26/22 0744 04/26/22 1202  BP: 114/74 100/69 125/78 (!) 127/94  Pulse: 65 (!) 59 60 64  Resp:  18 18 18   Temp: 97.7 F (36.5 C) 97.8 F (36.6 C) 98.1 F (36.7 C) 97.7 F (36.5 C)  TempSrc:  Oral Oral Oral Oral  SpO2: 100% 97% 100% 100%   General: Appear in no distress; no visible Abnormal Neck Mass Or lumps, Conjunctiva normal Cardiovascular: S1 and S2 Present, no Murmur, Respiratory: good respiratory effort, Bilateral Air entry present and CTA, no Crackles, no wheezes Abdomen: Bowel Sound present, Non tender  Extremities: no Pedal edema Neurology: alert and oriented to time, place, and person  There were no vitals filed for this visit. Condition at discharge: stable  The results of significant diagnostics from this hospitalization (including imaging, microbiology, ancillary and laboratory) are listed below for reference.   Imaging Studies:  DG Ankle Right Port  Result Date: 04/26/2022 CLINICAL DATA:  Trauma, fall, pain EXAM: PORTABLE RIGHT ANKLE - 2 VIEW COMPARISON:  03/19/2022 FINDINGS: No recent fracture or dislocation is seen. Small smooth marginated calcification in the dorsal aspect of talonavicular joint has not changed suggesting possible old avulsion. IMPRESSION: No recent fracture or dislocation is seen in right ankle. Electronically Signed   By: Elmer Picker M.D.   On: 04/26/2022 10:45   CT Head Wo Contrast  Result Date: 04/25/2022 CLINICAL DATA:  New onset seizure. EXAM: CT HEAD WITHOUT CONTRAST TECHNIQUE: Contiguous axial images were obtained from the base of the skull through the vertex without intravenous contrast. RADIATION DOSE REDUCTION: This exam was performed according to the departmental dose-optimization program which includes automated exposure control, adjustment of the mA and/or kV according to patient size and/or use of iterative reconstruction technique. COMPARISON:  06/12/2021. FINDINGS: Brain: No evidence of acute infarction, hemorrhage, hydrocephalus, extra-axial collection or mass lesion/mass effect. Vascular: No hyperdense vessel or unexpected calcification. Skull: Normal. Negative for fracture or focal lesion. Sinuses/Orbits: Globes and  orbits are unremarkable. Visualized sinuses are clear. Other: None. IMPRESSION: Normal unenhanced CT scan of the brain. Electronically Signed   By: Lajean Manes M.D.   On: 04/25/2022 12:57   DG Hand Complete Right  Result Date: 04/11/2022 CLINICAL DATA:  37 year old male with pain and swelling for the past month. EXAM: RIGHT HAND - COMPLETE 3+ VIEW COMPARISON:  Right hand series 08/02/2021. FINDINGS: Remote 5th metacarpal ORIF. Hardware in residual bone deformity appears stable. Background bone marrow signal is within normal limits. Joint spaces and alignment are maintained. Stable probable chronic fracture of the ulnar styloid also. No acute osseous abnormality identified. No discrete soft tissue abnormality. IMPRESSION: No acute radiographic finding. Stable appearance of chronic 5th metacarpal fracture and ORIF. Electronically Signed   By: Genevie Ann M.D.   On: 04/11/2022 10:42    Microbiology: Results for orders placed or performed during the hospital encounter of 08/02/21  Resp Panel by RT-PCR (Flu A&B, Covid) Urine, Clean Catch     Status: None   Collection Time: 08/02/21  9:04 AM   Specimen: Urine, Clean Catch; Nasopharyngeal(NP) swabs in vial transport medium  Result Value Ref Range Status   SARS Coronavirus 2 by RT PCR NEGATIVE NEGATIVE Final    Comment: (NOTE) SARS-CoV-2 target nucleic acids are NOT  DETECTED.  The SARS-CoV-2 RNA is generally detectable in upper respiratory specimens during the acute phase of infection. The lowest concentration of SARS-CoV-2 viral copies this assay can detect is 138 copies/mL. A negative result does not preclude SARS-Cov-2 infection and should not be used as the sole basis for treatment or other patient management decisions. A negative result may occur with  improper specimen collection/handling, submission of specimen other than nasopharyngeal swab, presence of viral mutation(s) within the areas targeted by this assay, and inadequate number of  viral copies(<138 copies/mL). A negative result must be combined with clinical observations, patient history, and epidemiological information. The expected result is Negative.  Fact Sheet for Patients:  BloggerCourse.com  Fact Sheet for Healthcare Providers:  SeriousBroker.it  This test is no t yet approved or cleared by the [place] FDA and  has been authorized for detection and/or diagnosis of SARS-CoV-2 by FDA under an Emergency Use Authorization (EUA). This EUA will remain  in effect (meaning this test can be used) for the duration of the COVID-19 declaration under Section 564(b)(1) of the Act, 21 U.S.C.section 360bbb-3(b)(1), unless the authorization is terminated  or revoked sooner.       Influenza A by PCR NEGATIVE NEGATIVE Final   Influenza B by PCR NEGATIVE NEGATIVE Final    Comment: (NOTE) The Xpert Xpress SARS-CoV-2/FLU/RSV plus assay is intended as an aid in the diagnosis of influenza from Nasopharyngeal swab specimens and should not be used as a sole basis for treatment. Nasal washings and aspirates are unacceptable for Xpert Xpress SARS-CoV-2/FLU/RSV testing.  Fact Sheet for Patients: BloggerCourse.com  Fact Sheet for Healthcare Providers: SeriousBroker.it  This test is not yet approved or cleared by the [place] FDA and has been authorized for detection and/or diagnosis of SARS-CoV-2 by FDA under an Emergency Use Authorization (EUA). This EUA will remain in effect (meaning this test can be used) for the duration of the COVID-19 declaration under Section 564(b)(1) of the Act, 21 U.S.C. section 360bbb-3(b)(1), unless the authorization is terminated or revoked.  Performed at [facility], [address] [place], [place] [address]    Labs: CBC: Recent Labs  Lab 04/25/22 1152  WBC 6.0  NEUTROABS 3.1  HGB 14.6  HCT 44.3  MCV 93.1   PLT 343   Basic Metabolic Panel: Recent Labs  Lab 04/25/22 1152  NA 137  K 4.9  CL 104  CO2 27  GLUCOSE 100*  BUN 13  CREATININE 1.21  CALCIUM 9.6  MG  --    Liver Function Tests: Recent Labs  Lab 04/25/22 1152  AST 29  ALT 26  ALKPHOS 63  BILITOT 0.7  PROT 7.8  ALBUMIN 4.1   CBG:   Discharge time spent: greater than 30 minutes.  Signed: Lynden Oxford, MD [facility] 04/26/2022

## 2022-05-08 ENCOUNTER — Ambulatory Visit: Payer: Medicaid Other | Admitting: Internal Medicine

## 2022-05-08 NOTE — Progress Notes (Deleted)
   CC: ***  HPI:[name] is a 37 y.o. male who presents for evaluation of ***. Please see individual problem based A/P for details.  16 yom hx of seizures, asthma, GERD, polysubstance use here to establish care  Sz - many visits to ED for seizures past year   Depression, PHQ-9: Based on the patients  score we have ***.  Past Medical History:  Diagnosis Date   Asthma    Seizures ([place])    Review of Systems:   ROS   Physical Exam: There were no vitals filed for this visit.   General: *** HEENT: Conjunctiva nl , antiicteric sclerae, moist mucous membranes, no exudate or erythema Cardiovascular: Normal rate, regular rhythm.  No murmurs, rubs, or gallops Pulmonary : Equal breath sounds, No wheezes, rales, or rhonchi Abdominal: soft, nontender,  bowel sounds present Ext: No edema in lower extremities, no tenderness to palpation of lower extremities.   Assessment & Plan:   See Encounters Tab for problem based charting.  Patient {GC/GE:[id number]::"discussed with","seen with"} Dr. {AUQJF:[id number]::"W. Hoffman","Guilloud","Mullen","Narendra","Raines","Vincent","Williams"}

## 2022-05-09 ENCOUNTER — Emergency Department (HOSPITAL_COMMUNITY)
Admission: EM | Admit: 2022-05-09 | Discharge: 2022-05-09 | Disposition: A | Discharge status: Home / Self Care | Payer: Medicaid Other | Attending: Emergency Medicine | Admitting: Emergency Medicine

## 2022-05-09 ENCOUNTER — Emergency Department (HOSPITAL_COMMUNITY): Payer: Medicaid Other

## 2022-05-09 ENCOUNTER — Encounter (HOSPITAL_COMMUNITY): Payer: Self-pay

## 2022-05-09 ENCOUNTER — Other Ambulatory Visit: Payer: Self-pay

## 2022-05-09 DIAGNOSIS — R519 Headache, unspecified: Secondary | ICD-10-CM | POA: Insufficient documentation

## 2022-05-09 DIAGNOSIS — R569 Unspecified convulsions: Secondary | ICD-10-CM | POA: Diagnosis present

## 2022-05-09 DIAGNOSIS — Z91148 Patient's other noncompliance with medication regimen for other reason: Secondary | ICD-10-CM | POA: Insufficient documentation

## 2022-05-09 DIAGNOSIS — M25571 Pain in right ankle and joints of right foot: Secondary | ICD-10-CM | POA: Diagnosis not present

## 2022-05-09 DIAGNOSIS — M25512 Pain in left shoulder: Secondary | ICD-10-CM | POA: Diagnosis not present

## 2022-05-09 DIAGNOSIS — W0110XA Fall on same level from slipping, tripping and stumbling with subsequent striking against unspecified object, initial encounter: Secondary | ICD-10-CM | POA: Diagnosis not present

## 2022-05-09 LAB — COMPREHENSIVE METABOLIC PANEL
ALT: 18 U/L (ref 0–44)
AST: 25 U/L (ref 15–41)
Albumin: 3.7 g/dL (ref 3.5–5.0)
Alkaline Phosphatase: 57 U/L (ref 38–126)
Anion gap: 9 (ref 5–15)
BUN: 8 mg/dL (ref 6–20)
CO2: 25 mmol/L (ref 22–32)
Calcium: 9.3 mg/dL (ref 8.9–10.3)
Chloride: 105 mmol/L (ref 98–111)
Creatinine, Ser: 1.09 mg/dL (ref 0.61–1.24)
GFR, Estimated: >60 mL/min (ref >60)
Glucose, Bld: 80 mg/dL (ref 70–99)
Potassium: 4.6 mmol/L (ref 3.5–5.1)
Sodium: 139 mmol/L (ref 135–145)
Total Bilirubin: 0.5 mg/dL (ref 0.3–1.2)
Total Protein: 6.5 g/dL (ref 6.5–8.1)

## 2022-05-09 LAB — CBC WITH DIFFERENTIAL/PLATELET
Abs Immature Granulocytes: 0.01 10*3/uL (ref 0.00–0.07)
Basophils Absolute: 0.1 10*3/uL (ref 0.0–0.1)
Basophils Relative: 1 %
Eosinophils Absolute: 0.1 10*3/uL (ref 0.0–0.5)
Eosinophils Relative: 3 %
HCT: 42.2 % (ref 39.0–52.0)
Hemoglobin: 13.9 g/dL (ref 13.0–17.0)
Immature Granulocytes: 0 %
Lymphocytes Relative: 46 %
Lymphs Abs: 2.2 10*3/uL (ref 0.7–4.0)
MCH: 31.1 pg (ref 26.0–34.0)
MCHC: 32.9 g/dL (ref 30.0–36.0)
MCV: 94.4 fL (ref 80.0–100.0)
Monocytes Absolute: 0.4 10*3/uL (ref 0.1–1.0)
Monocytes Relative: 8 %
Neutro Abs: 2 10*3/uL (ref 1.7–7.7)
Neutrophils Relative %: 42 %
Platelets: 294 10*3/uL (ref 150–400)
RBC: 4.47 MIL/uL (ref 4.22–5.81)
RDW: 11.9 % (ref 11.5–15.5)
WBC: 4.8 10*3/uL (ref 4.0–10.5)
nRBC: 0 % (ref 0.0–0.2)

## 2022-05-09 LAB — PHENYTOIN LEVEL, TOTAL: Phenytoin Lvl: <2.5 ug/mL — ABNORMAL LOW (ref 10.0–20.0)

## 2022-05-09 LAB — URINALYSIS, ROUTINE W REFLEX MICROSCOPIC
Bilirubin Urine: NEGATIVE
Glucose, UA: NEGATIVE mg/dL
Hgb urine dipstick: NEGATIVE
Ketones, ur: NEGATIVE mg/dL
Leukocytes,Ua: NEGATIVE
Nitrite: NEGATIVE
Protein, ur: NEGATIVE mg/dL
Specific Gravity, Urine: 1.015 (ref 1.005–1.030)
pH: 7 (ref 5.0–8.0)

## 2022-05-09 LAB — RAPID URINE DRUG SCREEN, HOSP PERFORMED
Amphetamines: NOT DETECTED
Barbiturates: NOT DETECTED
Benzodiazepines: POSITIVE — AB
Cocaine: NOT DETECTED
Opiates: NOT DETECTED
Tetrahydrocannabinol: POSITIVE — AB

## 2022-05-09 LAB — MAGNESIUM: Magnesium: 1.8 mg/dL (ref 1.7–2.4)

## 2022-05-09 LAB — ETHANOL: Alcohol, Ethyl (B): <10 mg/dL (ref <10)

## 2022-05-09 MED ORDER — LEVETIRACETAM IN NACL 1000 MG/100ML IV SOLN
1000.0000 mg | Freq: Once | INTRAVENOUS | Status: AC
  Administered 2022-05-09: 1000 mg via INTRAVENOUS
  Filled 2022-05-09: qty 100

## 2022-05-09 MED ORDER — SODIUM CHLORIDE 0.9 % IV BOLUS
1000.0000 mL | Freq: Once | INTRAVENOUS | Status: AC
  Administered 2022-05-09: 1000 mL via INTRAVENOUS

## 2022-05-09 MED ORDER — ACETAMINOPHEN 500 MG PO TABS
1000.0000 mg | ORAL_TABLET | Freq: Once | ORAL | Status: AC
  Administered 2022-05-09: 1000 mg via ORAL
  Filled 2022-05-09: qty 2

## 2022-05-09 MED ORDER — SODIUM CHLORIDE 0.9 % IV SOLN: INTRAVENOUS | Status: DC

## 2022-05-09 MED ORDER — KETOROLAC TROMETHAMINE 30 MG/ML IJ SOLN
30.0000 mg | Freq: Once | INTRAMUSCULAR | Status: AC
  Administered 2022-05-09: 30 mg via INTRAVENOUS
  Filled 2022-05-09: qty 1

## 2022-05-09 MED ORDER — PHENYTOIN SODIUM EXTENDED 100 MG PO CAPS: 300.0000 mg | ORAL_CAPSULE | Freq: Every day | ORAL | 0 refills | Status: DC

## 2022-05-09 MED ORDER — LEVETIRACETAM 500 MG PO TABS: 1000.0000 mg | ORAL_TABLET | Freq: Two times a day (BID) | ORAL | 0 refills | Status: DC

## 2022-05-09 NOTE — ED Provider Notes (Signed)
**[name]-Identified via Obfuscation** Martin Cooper   CSN: [id number] Arrival date & time: 05/09/22  0945     History  Chief Complaint  Patient presents with   Seizures    From home on ems arrival patient on floor with seizure like activity, tonic clonic in nature. Given 5 of versed IM. Seizure activity stopped. Patient responsive to verbal.     Martin Cooper is a 37 y.o. male.  Pt is a 37 yo male with a pmhx significant for seizure d/o and polysubstance abuse.  He has a hx of medication noncompliance and has not been taking his keppra.  He was admitted from 1/10-1/11 for seizures.  He was back on the 12th.  He was supposed to see neuro on 1/18.  He did not go.  An appt was made with [facility] for yesterday, but he did not go.  He has [place] Medicaid and was told to get this changed to [place], but he has not done son.  Today, EMS was called for seizure.  They said he had tonic clonic movements on the floor.  He was given 5 mg versed IM en route.  Pt is awake and alert now and said he has not been taking his meds.  He complains of right ankle pain which looks to be chronic.  He said he hit his head when he fell and has a headache.  He also c/o left clavicle pain.       Home Medications Prior to Admission medications   Medication Sig Start Date End Date Taking? Authorizing Provider  levETIRAcetam (KEPPRA) 500 MG tablet Take 2 tablets (1,000 mg total) by mouth 2 (two) times daily. 05/09/22 06/08/22  Isla Pence, MD  phenytoin (DILANTIN) 100 MG ER capsule Take 3 capsules (300 mg total) by mouth at bedtime. 05/09/22 06/08/22  Isla Pence, MD  phenytoin (DILANTIN) 30 MG ER capsule Take 1 capsule (30 mg total) by mouth at bedtime. Patient not taking: Reported on 04/27/2022 04/26/22 05/27/22  Lavina Hamman, MD      Allergies    Patient has no known allergies.    Review of Systems   Review of Systems  Musculoskeletal:        Right ankle pain, left clavicle  pain  Neurological:  Positive for seizures.  All other systems reviewed and are negative.   Physical Exam Updated Vital Signs BP 119/87   Pulse (!) 57   Temp 98.3 F (36.8 C)   Resp 20   Ht 5\' 10"  (1.778 m)   Wt 80 kg   SpO2 99%   BMI 25.31 kg/m  Physical Exam Vitals and nursing Cooper reviewed.  Constitutional:      Appearance: Normal appearance.  HENT:     Head: Normocephalic and atraumatic.     Right Ear: External ear normal.     Left Ear: External ear normal.     Nose: Nose normal.     Mouth/Throat:     Mouth: Mucous membranes are moist.     Pharynx: Oropharynx is clear.  Eyes:     Extraocular Movements: Extraocular movements intact.     Conjunctiva/sclera: Conjunctivae normal.     Pupils: Pupils are equal, round, and reactive to light.  Cardiovascular:     Rate and Rhythm: Normal rate and regular rhythm.     Pulses: Normal pulses.     Heart sounds: Normal heart sounds.  Pulmonary:     Effort: Pulmonary effort  is normal.     Breath sounds: Normal breath sounds.  Abdominal:     General: Abdomen is flat. Bowel sounds are normal.     Palpations: Abdomen is soft.  Musculoskeletal:       Arms:     Cervical back: Normal range of motion and neck supple.       Legs:     Comments: No deformities or swelling noted in clavicle area or in ankle area.  Skin:    General: Skin is warm.     Capillary Refill: Capillary refill takes less than 2 seconds.  Neurological:     General: No focal deficit present.     Mental Status: He is alert and oriented to person, place, and time.  Psychiatric:        Mood and Affect: Mood normal.        Behavior: Behavior normal.     ED Results / Procedures / Treatments   Labs (all labs ordered are listed, but only abnormal results are displayed) Labs Reviewed  COMPREHENSIVE METABOLIC PANEL  CBC WITH DIFFERENTIAL/PLATELET  ETHANOL  MAGNESIUM  URINALYSIS, ROUTINE W REFLEX MICROSCOPIC  RAPID URINE DRUG SCREEN, HOSP PERFORMED   PHENYTOIN LEVEL, TOTAL  CBG MONITORING, ED    EKG EKG Interpretation  Date/Time:  Wednesday May 09 2022 09:57:24 EST Ventricular Rate:  51 PR Interval:  153 QRS Duration: 94 QT Interval:  433 QTC Calculation: 399 R Axis:   113 Text Interpretation: Right and left arm electrode reversal, interpretation assumes no reversal Sinus or ectopic atrial rhythm Consider left ventricular hypertrophy Abnormal T, consider ischemia, lateral leads ST elevation, consider anterolateral injury No significant change since last tracing Confirmed by Isla Pence [id number]) on 05/09/2022 10:02:30 AM  Radiology DG Chest 2 View  Result Date: 05/09/2022 CLINICAL DATA:  Seizure. EXAM: CHEST - 2 VIEW COMPARISON:  04/27/2022 FINDINGS: The cardiac silhouette, mediastinal and hilar contours are normal. The lungs are clear. No pleural effusions. No pneumothorax. The bony thorax is intact. IMPRESSION: No acute cardiopulmonary findings. Electronically Signed   By: Marijo Sanes M.D.   On: 05/09/2022 11:47   CT HEAD WO CONTRAST  Result Date: 05/09/2022 CLINICAL DATA:  Polytrauma, blunt EXAM: CT HEAD WITHOUT CONTRAST TECHNIQUE: Contiguous axial images were obtained from the base of the skull through the vertex without intravenous contrast. RADIATION DOSE REDUCTION: This exam was performed according to the departmental dose-optimization program which includes automated exposure control, adjustment of the mA and/or kV according to patient size and/or use of iterative reconstruction technique. COMPARISON:  None Available. FINDINGS: Brain: No evidence of acute infarction, hemorrhage, hydrocephalus, extra-axial collection or mass lesion/mass effect. Vascular: No hyperdense vessel identified. Skull: No acute fracture. Sinuses/Orbits: Visualized sinuses are largely clear. No acute orbital findings. Other: No mastoid effusions IMPRESSION: No evidence of acute intracranial abnormality. Electronically Signed   By: Margaretha Sheffield  M.D.   On: 05/09/2022 10:31    Procedures Procedures    Medications Ordered in ED Medications  sodium chloride 0.9 % bolus 1,000 mL (0 mLs Intravenous Stopped 05/09/22 1239)    And  0.9 %  sodium chloride infusion ( Intravenous New Bag/Given 05/09/22 1238)  ketorolac (TORADOL) 30 MG/ML injection 30 mg (has no administration in time range)  levETIRAcetam (KEPPRA) IVPB 1000 mg/100 mL premix (0 mg Intravenous Stopped 05/09/22 1230)  acetaminophen (TYLENOL) tablet 1,000 mg (1,000 mg Oral Given 05/09/22 1149)    ED Course/ Medical Decision Making/ A&P  Medical Decision Making Amount and/or Complexity of Data Reviewed Labs: ordered. Radiology: ordered.  Risk OTC drugs. Prescription drug management.   This patient presents to the ED for concern of seizure, this involves an extensive number of treatment options, and is a complaint that carries with it a high risk of complications and morbidity.  The differential diagnosis includes med noncompliance, breakthrough seizure, substance abuse, electrolyte abn   Co morbidities that complicate the patient evaluation  Seizures and polysubstance abuse   Additional history obtained:  Additional history obtained from epic chart review External records from outside source obtained and reviewed including EMS report   Lab Tests:  I Ordered, and personally interpreted labs.  The pertinent results include:  dbc nl, cmp nl, mg 1.8 and etoh <10   Imaging Studies ordered:  I ordered imaging studies including ct head/cxr  I independently visualized and interpreted imaging which showed  CT head: No evidence of acute intracranial abnormality.  CXR: No evidence of acute intracranial abnormality.  I agree with the radiologist interpretation   Cardiac Monitoring:  The patient was maintained on a cardiac monitor.  I personally viewed and interpreted the cardiac monitored which showed an underlying rhythm of:  nsr   Medicines ordered and prescription drug management:  I ordered medication including keppra  for seizure and tylenol for pain  Reevaluation of the patient after these medicines showed that the patient improved I have reviewed the patients home medicines and have made adjustments as needed   Test Considered:  ct   Critical Interventions:  Iv keppra  Problem List / ED Course:  Seizure:  due to medication noncompliance.  Pt given a refill of his keppra and dilantin.  Pt encouraged to take his meds.  He is encouraged to go to his doctor appointments.  He is to not drive. Fall:  no internal injury from seizure.  He has been given several sets of crutches by the ED.  He does not need another one.   Reevaluation:  After the interventions noted above, I reevaluated the patient and found that they have :improved   Social Determinants of Health:  Lives at home; [facility] Medicaid   Dispostion:  After consideration of the diagnostic results and the patients response to treatment, I feel that the patent would benefit from discharge with outpatient f/u.          Final Clinical Impression(s) / ED Diagnoses Final diagnoses:  Seizure (HCC)  Noncompliance with medication regimen    Rx / DC Orders ED Discharge Orders          Ordered    levETIRAcetam (KEPPRA) 500 MG tablet  2 times daily        05/09/22 1323    phenytoin (DILANTIN) 100 MG ER capsule  Daily at bedtime        05/09/22 1323              Jacalyn Lefevre, MD 05/09/22 1328

## 2022-05-09 NOTE — Discharge Instructions (Addendum)
It is very important to take your seizure medication.  You also need to keep your doctor appointments.  Continue to avoid alcohol and non-prescription drugs.  Do not drive.  You need to get your [facility] Medicaid changed to [place].  Health, [place]. Go to.   Specialty: [place] Why: Please call or go to [facility] to have Medicaid changed to [place]. Contact information: [address] [place] [address] [phone]

## 2022-05-10 ENCOUNTER — Other Ambulatory Visit (HOSPITAL_COMMUNITY): Payer: Self-pay

## 2022-05-16 ENCOUNTER — Emergency Department (HOSPITAL_COMMUNITY): Payer: Medicaid Other

## 2022-05-16 ENCOUNTER — Emergency Department (HOSPITAL_COMMUNITY)
Admission: EM | Admit: 2022-05-16 | Discharge: 2022-05-16 | Disposition: A | Discharge status: Home / Self Care | Payer: Medicaid Other | Attending: Student | Admitting: Student

## 2022-05-16 ENCOUNTER — Encounter (HOSPITAL_COMMUNITY): Payer: Self-pay

## 2022-05-16 ENCOUNTER — Other Ambulatory Visit: Payer: Self-pay

## 2022-05-16 DIAGNOSIS — Z1152 Encounter for screening for COVID-19: Secondary | ICD-10-CM | POA: Diagnosis not present

## 2022-05-16 DIAGNOSIS — M25531 Pain in right wrist: Secondary | ICD-10-CM | POA: Diagnosis not present

## 2022-05-16 DIAGNOSIS — R739 Hyperglycemia, unspecified: Secondary | ICD-10-CM | POA: Diagnosis not present

## 2022-05-16 DIAGNOSIS — E162 Hypoglycemia, unspecified: Secondary | ICD-10-CM

## 2022-05-16 DIAGNOSIS — F17210 Nicotine dependence, cigarettes, uncomplicated: Secondary | ICD-10-CM | POA: Diagnosis not present

## 2022-05-16 DIAGNOSIS — J45909 Unspecified asthma, uncomplicated: Secondary | ICD-10-CM | POA: Diagnosis not present

## 2022-05-16 DIAGNOSIS — R569 Unspecified convulsions: Secondary | ICD-10-CM

## 2022-05-16 DIAGNOSIS — M79641 Pain in right hand: Secondary | ICD-10-CM | POA: Diagnosis present

## 2022-05-16 LAB — COMPREHENSIVE METABOLIC PANEL
ALT: 12 U/L (ref 0–44)
AST: 20 U/L (ref 15–41)
Albumin: 4.2 g/dL (ref 3.5–5.0)
Alkaline Phosphatase: 61 U/L (ref 38–126)
Anion gap: 8 (ref 5–15)
BUN: 10 mg/dL (ref 6–20)
CO2: 28 mmol/L (ref 22–32)
Calcium: 9.6 mg/dL (ref 8.9–10.3)
Chloride: 102 mmol/L (ref 98–111)
Creatinine, Ser: 1.26 mg/dL — ABNORMAL HIGH (ref 0.61–1.24)
GFR, Estimated: >60 mL/min (ref >60)
Glucose, Bld: 90 mg/dL (ref 70–99)
Potassium: 4.6 mmol/L (ref 3.5–5.1)
Sodium: 138 mmol/L (ref 135–145)
Total Bilirubin: 0.6 mg/dL (ref 0.3–1.2)
Total Protein: 7.4 g/dL (ref 6.5–8.1)

## 2022-05-16 LAB — CBC WITH DIFFERENTIAL/PLATELET
Abs Immature Granulocytes: 0.01 10*3/uL (ref 0.00–0.07)
Basophils Absolute: 0 10*3/uL (ref 0.0–0.1)
Basophils Relative: 1 %
Eosinophils Absolute: 0.1 10*3/uL (ref 0.0–0.5)
Eosinophils Relative: 2 %
HCT: 43.9 % (ref 39.0–52.0)
Hemoglobin: 15 g/dL (ref 13.0–17.0)
Immature Granulocytes: 0 %
Lymphocytes Relative: 47 %
Lymphs Abs: 2.4 10*3/uL (ref 0.7–4.0)
MCH: 31.5 pg (ref 26.0–34.0)
MCHC: 34.2 g/dL (ref 30.0–36.0)
MCV: 92.2 fL (ref 80.0–100.0)
Monocytes Absolute: 0.3 10*3/uL (ref 0.1–1.0)
Monocytes Relative: 6 %
Neutro Abs: 2.3 10*3/uL (ref 1.7–7.7)
Neutrophils Relative %: 44 %
Platelets: 311 10*3/uL (ref 150–400)
RBC: 4.76 MIL/uL (ref 4.22–5.81)
RDW: 12.1 % (ref 11.5–15.5)
WBC: 5.2 10*3/uL (ref 4.0–10.5)
nRBC: 0 % (ref 0.0–0.2)

## 2022-05-16 LAB — CK: Total CK: 316 U/L (ref 49–397)

## 2022-05-16 LAB — RAPID URINE DRUG SCREEN, HOSP PERFORMED
Amphetamines: NOT DETECTED
Barbiturates: NOT DETECTED
Benzodiazepines: NOT DETECTED
Cocaine: NOT DETECTED
Opiates: NOT DETECTED
Tetrahydrocannabinol: POSITIVE — AB

## 2022-05-16 LAB — CBG MONITORING, ED
Glucose-Capillary: 84 mg/dL (ref 70–99)
Glucose-Capillary: 60 mg/dL — ABNORMAL LOW (ref 70–99)
Glucose-Capillary: 126 mg/dL — ABNORMAL HIGH (ref 70–99)

## 2022-05-16 LAB — RESP PANEL BY RT-PCR (RSV, FLU A&B, COVID)  RVPGX2
Influenza A by PCR: NEGATIVE
Influenza B by PCR: NEGATIVE
Resp Syncytial Virus by PCR: NEGATIVE
SARS Coronavirus 2 by RT PCR: NEGATIVE

## 2022-05-16 LAB — ETHANOL: Alcohol, Ethyl (B): <10 mg/dL (ref <10)

## 2022-05-16 MED ORDER — DEXTROSE 50 % IV SOLN
1.0000 | Freq: Once | INTRAVENOUS | Status: AC
  Administered 2022-05-16: 50 mL via INTRAVENOUS

## 2022-05-16 MED ORDER — KETOROLAC TROMETHAMINE 15 MG/ML IJ SOLN
15.0000 mg | Freq: Once | INTRAMUSCULAR | Status: AC
  Administered 2022-05-16: 15 mg via INTRAVENOUS
  Filled 2022-05-16: qty 1

## 2022-05-16 MED ORDER — LEVETIRACETAM 500 MG PO TABS
1000.0000 mg | ORAL_TABLET | Freq: Two times a day (BID) | ORAL | Status: DC
  Administered 2022-05-16: 1000 mg via ORAL
  Filled 2022-05-16: qty 2

## 2022-05-16 MED ORDER — LACTATED RINGERS IV BOLUS
1000.0000 mL | Freq: Once | INTRAVENOUS | Status: AC
  Administered 2022-05-16: 1000 mL via INTRAVENOUS

## 2022-05-16 NOTE — ED Notes (Signed)
Patient denies seizure. Patients states he had LOC when going to get food in house. Patient states he does not think he hit his head. Patient rates pain 10/10.

## 2022-05-16 NOTE — ED Provider Notes (Signed)
[place] EMERGENCY DEPARTMENT AT [facility] Provider Note  CSN: [id number] Arrival date & time: 05/16/22 1311  Chief Complaint(s) Rt Hand Pain and Shaking  HPI Martin Cooper is a 37 y.o. male with PMH asthma, seizure disorder, polysubstance abuse, multiple ER presentations for seizures who presents emergency department for evaluation of a possible seizure episode and right hand pain.  Patient states that over the last 24 hours he has been feeling ill with malaise, nausea and decreased p.o. intake.  He states that he was feeling a little dizzy while laying in bed today, stood up quickly out of bed and felt like he may have passed out on the ground.  History from EMS stating that patient may have been down for approximately 1 hour and whether or not the patient had a seizure is currently unknown.  Here in the emergency department, patient is alert and oriented answering all questions appropriately and currently not postictal.  He states that he feels like his blood sugar is low.  Also endorsing right hand pain at the site of a previous surgical site.  Denies chest pain, shortness of breath, numbness, tingling, weakness or other systemic symptoms.   Past Medical History Past Medical History:  Diagnosis Date   Asthma    Seizures (HCC)    Patient Active Problem List   Diagnosis Date Noted   Seizures (HCC) 06/10/2021   Seizure (HCC) 06/09/2021   Asthma 06/09/2021   Tobacco abuse 06/09/2021   GERD (gastroesophageal reflux disease) 06/09/2021   Polysubstance abuse (HCC) 06/09/2021   Home Medication(s) Prior to Admission medications   Medication Sig Start Date End Date Taking? Authorizing Provider  levETIRAcetam (KEPPRA) 500 MG tablet Take 2 tablets (1,000 mg total) by mouth 2 (two) times daily. 05/09/22 06/08/22  Jacalyn Lefevre, MD  phenytoin (DILANTIN) 100 MG ER capsule Take 3 capsules (300 mg total) by mouth at bedtime. 05/09/22 06/08/22  Jacalyn Lefevre, MD  phenytoin (DILANTIN)  30 MG ER capsule Take 1 capsule (30 mg total) by mouth at bedtime. Patient not taking: Reported on 04/27/2022 04/26/22 05/26/22  Rolly Salter, MD                                                                                                                                    Past Surgical History History reviewed. No pertinent surgical history. Family History Family History  Problem Relation Age of Onset   Hypertension Mother    Kidney disease Father     Social History Social History   Tobacco Use   Smoking status: Every Day    Packs/day: 0.50    Types: Cigarettes   Smokeless tobacco: Never  Vaping Use   Vaping Use: Never used  Substance Use Topics   Alcohol use: Not Currently   Drug use: Yes    Types: Cocaine, Marijuana   Allergies Patient has no known allergies.  Review of Systems Review of  Systems  Musculoskeletal:  Positive for arthralgias.  Neurological:  Positive for seizures and syncope.    Physical Exam Vital Signs  I have reviewed the triage vital signs BP 111/78   Pulse (!) 51   Temp 98.6 F (37 C) (Oral)   Resp 16   Ht 5\' 10"  (1.778 m)   Wt 81.6 kg   SpO2 99%   BMI 25.83 kg/m   Physical Exam Constitutional:      General: He is not in acute distress.    Appearance: Normal appearance.  HENT:     Head: Normocephalic and atraumatic.     Nose: No congestion or rhinorrhea.  Eyes:     General:        Right eye: No discharge.        Left eye: No discharge.     Extraocular Movements: Extraocular movements intact.     Pupils: Pupils are equal, round, and reactive to light.  Cardiovascular:     Rate and Rhythm: Normal rate and regular rhythm.     Heart sounds: No murmur heard. Pulmonary:     Effort: No respiratory distress.     Breath sounds: No wheezing or rales.  Abdominal:     General: There is no distension.     Tenderness: There is no abdominal tenderness.  Musculoskeletal:        General: Tenderness present. Normal range of motion.      Cervical back: Normal range of motion.  Skin:    General: Skin is warm and dry.  Neurological:     General: No focal deficit present.     Mental Status: He is alert.     ED Results and Treatments Labs (all labs ordered are listed, but only abnormal results are displayed) Labs Reviewed  COMPREHENSIVE METABOLIC PANEL - Abnormal; Notable for the following components:      Result Value   Creatinine, Ser 1.26 (*)    All other components within normal limits  RAPID URINE DRUG SCREEN, HOSP PERFORMED - Abnormal; Notable for the following components:   Tetrahydrocannabinol POSITIVE (*)    All other components within normal limits  CBC WITH DIFFERENTIAL/PLATELET  ETHANOL  LEVETIRACETAM LEVEL  CBG MONITORING, ED                                                                                                                          Radiology CT Head Wo Contrast  Result Date: 05/16/2022 CLINICAL DATA:  Provided history: Seizure, new-onset, no history of trauma EXAM: CT HEAD WITHOUT CONTRAST TECHNIQUE: Contiguous axial images were obtained from the base of the skull through the vertex without intravenous contrast. RADIATION DOSE REDUCTION: This exam was performed according to the departmental dose-optimization program which includes automated exposure control, adjustment of the mA and/or kV according to patient size and/or use of iterative reconstruction technique. COMPARISON:  Head CT 1 week ago 05/09/2022 FINDINGS: Brain: No intracranial hemorrhage, mass effect, or midline shift. No hydrocephalus. The basilar  cisterns are patent. No evidence of territorial infarct or acute ischemia. No extra-axial or intracranial fluid collection. Vascular: No hyperdense vessel or unexpected calcification. Skull: No fracture or focal lesion. Sinuses/Orbits: Paranasal sinuses and mastoid air cells are clear. The visualized orbits are unremarkable. Other: None. IMPRESSION: Negative head CT.  Stable exam without acute  findings. Electronically Signed   By: Keith Rake M.D.   On: 05/16/2022 15:03   DG Hand Complete Right  Result Date: 05/16/2022 CLINICAL DATA:  Fall.  Right hand pain. EXAM: RIGHT HAND - COMPLETE 3+ VIEW COMPARISON:  Right hand radiographs 04/11/2022 FINDINGS: Normal bone mineralization. Remote dorsal plate and screw ORIF at the proximal fifth metacarpal is unchanged. Unchanged mild dorsal apex remote fracture angulation of the fifth metacarpal neck. Minimal degenerative spurring at the distal medial aspect of the hamate at the fifth carpometacarpal joint, unchanged. Otherwise joint spaces are maintained. No acute fracture or dislocation. IMPRESSION: 1. No acute fracture. 2. Remote dorsal plate and screw ORIF of the proximal fifth metacarpal. Electronically Signed   By: Yvonne Kendall M.D.   On: 05/16/2022 14:19    Pertinent labs & imaging results that were available during my care of the patient were reviewed by me and considered in my medical decision making (see MDM for details).  Medications Ordered in ED Medications - No data to display                                                                                                                                   Procedures .Critical Care  Performed by: Teressa Lower, MD Authorized by: Teressa Lower, MD   Critical care provider statement:    Critical care time (minutes):  30   Critical care was necessary to treat or prevent imminent or life-threatening deterioration of the following conditions:  Endocrine crisis   Critical care was time spent personally by me on the following activities:  Development of treatment plan with patient or surrogate, discussions with consultants, evaluation of patient's response to treatment, examination of patient, ordering and review of laboratory studies, ordering and review of radiographic studies, ordering and performing treatments and interventions, pulse oximetry, re-evaluation of patient's  condition and review of old charts   (including critical care time)  Medical Decision Making / ED Course   This patient presents to the ED for concern of syncope/seizure, this involves an extensive number of treatment options, and is a complaint that carries with it a high risk of complications and morbidity.  The differential diagnosis includes orthostatic syncope, seizure, hypoglycemia, electrolyte abnormality, fracture, cardiogenic syncope  MDM: Patient seen emerged part for evaluation of multiple complaints described above.  Physical exam with some tenderness over the right wrist but is otherwise unremarkable.  No other external signs of trauma.  Laboratory evaluation with initial POC glucose of 60, creatinine 1.26 but is otherwise unremarkable.  UDS positive for THC only and was obtained in the setting  of undifferentiated seizure today.  COVID, flu, RSV all negative.  CK normal.  CT head unremarkable.  X-ray of the wrist with no fracture.  Patient received his home Keppra, fluid resuscitation and an amp of D50 and on reevaluation his symptoms significant improved.  Blood sugars improved.  He was given a wrist brace for a suspected sprain of his right wrist after the fall today.  Difficult to tell whether today's presentation is secondary to decreased p.o. intake, hypoglycemia and resultant orthostatic syncope or hypoglycemic seizure, but regardless we have addressed the hyperglycemia today and he currently does not meet inpatient criteria for admission.  He was encouraged to follow-up with his neurologist an he was dc with outpatient follow-up.   Additional history obtained: -External records from outside source obtained and reviewed including: Chart review including previous notes, labs, imaging, consultation notes   Lab Tests: -I ordered, reviewed, and interpreted labs.   The pertinent results include:   Labs Reviewed  COMPREHENSIVE METABOLIC PANEL - Abnormal; Notable for the following  components:      Result Value   Creatinine, Ser 1.26 (*)    All other components within normal limits  RAPID URINE DRUG SCREEN, HOSP PERFORMED - Abnormal; Notable for the following components:   Tetrahydrocannabinol POSITIVE (*)    All other components within normal limits  CBC WITH DIFFERENTIAL/PLATELET  ETHANOL  LEVETIRACETAM LEVEL  CBG MONITORING, ED      EKG   EKG Interpretation  Date/Time:  Wednesday May 16 2022 17:47:20 EST Ventricular Rate:  50 PR Interval:  148 QRS Duration: 104 QT Interval:  444 QTC Calculation: 404 R Axis:   79 Text Interpretation: Sinus bradycardia with Premature supraventricular complexes ST elevation, consider early repolarization, pericarditis, or injury Confirmed by Palumbo, April ([id number]) on 05/17/2022 9:03:40 AM         Imaging Studies ordered: I ordered imaging studies including hand x-ray, CT head I independently visualized and interpreted imaging. I agree with the radiologist interpretation   Medicines ordered and prescription drug management: No orders of the defined types were placed in this encounter.   -I have reviewed the patients home medicines and have made adjustments as needed  Critical interventions Fluid resuscitation, glucose administration    Cardiac Monitoring: The patient was maintained on a cardiac monitor.  I personally viewed and interpreted the cardiac monitored which showed an underlying rhythm of: Sinus bradycardia  Social Determinants of Health:  Factors impacting patients care include: THC use   Reevaluation: After the interventions noted above, I reevaluated the patient and found that they have :improved  Co morbidities that complicate the patient evaluation  Past Medical History:  Diagnosis Date   Asthma    Seizures ([place])       Dispostion: I considered admission for this patient, but he does not meet inpatient criteria for admission he is safe for discharge outpatient  follow-up     Final Clinical Impression(s) / ED Diagnoses Final diagnoses:  None     @PCDICTATION @    Analei Whinery, Debe Coder, MD 05/17/22 1239

## 2022-05-16 NOTE — ED Notes (Signed)
Ortho tech called for wrist brace  

## 2022-05-16 NOTE — ED Triage Notes (Addendum)
Pt BIB [facility] form home d/t Rt hand pain & shaking. A/Ox4, 118/60, 64 bpm, CBG 103, 98% on RA.   Pt adds that he had a seizure this morning & was down for maybe an hr before his girlfriend found him, swelling noted to his Rt hand.

## 2022-05-16 NOTE — ED Provider Triage Note (Signed)
Emergency Medicine Provider Triage Evaluation Note  Martin Cooper , a 37 y.o. male  was evaluated in triage.  Pt complains of seizure.  He states that he was walking to his bedroom this morning when he started shaking and had a syncopal episode.  He states that he was unconscious for approximately 1 hour before his girlfriend found him.  Has a history of seizures which typically occur when he gets upset.  He was started on Keppra and another antiseizure medicine 1 week ago.  This is his first episode since starting the medicine.  Has follow-up with neurology later next month.  Currently complaining of weakness and right hand pain.  States he hit his hand on the dresser when he fell initially.  Review of Systems  Positive: As above Negative: As above  Physical Exam  BP 111/78   Pulse (!) 51   Temp 98.6 F (37 C) (Oral)   Resp 16   Ht 5\' 10"  (1.778 m)   Wt 81.6 kg   SpO2 99%   BMI 25.83 kg/m  Gen:   Awake, no distress   Resp:  Normal effort  MSK:   Moves extremities without difficulty  Other:    Medical Decision Making  Medically screening exam initiated at 1:29 PM.  Appropriate orders placed.  MASSIAH MINJARES was informed that the remainder of the evaluation will be completed by another provider, this initial triage assessment does not replace that evaluation, and the importance of remaining in the ED until their evaluation is complete.  Seizure labs, CT head, right hand x-ray ordered   Roylene Reason, Hershal Coria 05/16/22 1331

## 2022-06-25 ENCOUNTER — Encounter: Payer: Self-pay | Admitting: Internal Medicine

## 2022-06-25 ENCOUNTER — Other Ambulatory Visit: Payer: Self-pay

## 2022-06-25 ENCOUNTER — Ambulatory Visit (INDEPENDENT_AMBULATORY_CARE_PROVIDER_SITE_OTHER): Payer: Medicaid Other | Admitting: Internal Medicine

## 2022-06-25 ENCOUNTER — Other Ambulatory Visit (HOSPITAL_COMMUNITY)
Admission: RE | Admit: 2022-06-25 | Discharge: 2022-06-25 | Disposition: A | Discharge status: Home / Self Care | Payer: Medicaid Other | Source: Ambulatory Visit | Attending: Internal Medicine | Admitting: Internal Medicine

## 2022-06-25 VITALS — BP 126/80 | HR 60 | Temp 98.1°F | Ht 70.0 in | Wt 178.2 lb

## 2022-06-25 DIAGNOSIS — F819 Developmental disorder of scholastic skills, unspecified: Secondary | ICD-10-CM | POA: Insufficient documentation

## 2022-06-25 DIAGNOSIS — R479 Unspecified speech disturbances: Secondary | ICD-10-CM | POA: Insufficient documentation

## 2022-06-25 DIAGNOSIS — Z23 Encounter for immunization: Secondary | ICD-10-CM | POA: Diagnosis not present

## 2022-06-25 DIAGNOSIS — G40909 Epilepsy, unspecified, not intractable, without status epilepticus: Secondary | ICD-10-CM | POA: Diagnosis not present

## 2022-06-25 DIAGNOSIS — R300 Dysuria: Secondary | ICD-10-CM | POA: Diagnosis not present

## 2022-06-25 DIAGNOSIS — R310 Gross hematuria: Secondary | ICD-10-CM

## 2022-06-25 DIAGNOSIS — Z119 Encounter for screening for infectious and parasitic diseases, unspecified: Secondary | ICD-10-CM

## 2022-06-25 DIAGNOSIS — F1910 Other psychoactive substance abuse, uncomplicated: Secondary | ICD-10-CM

## 2022-06-25 DIAGNOSIS — R319 Hematuria, unspecified: Secondary | ICD-10-CM | POA: Insufficient documentation

## 2022-06-25 DIAGNOSIS — Z0000 Encounter for general adult medical examination without abnormal findings: Secondary | ICD-10-CM | POA: Diagnosis not present

## 2022-06-25 DIAGNOSIS — H6690 Otitis media, unspecified, unspecified ear: Secondary | ICD-10-CM | POA: Insufficient documentation

## 2022-06-25 DIAGNOSIS — F1429 Cocaine dependence with unspecified cocaine-induced disorder: Secondary | ICD-10-CM | POA: Insufficient documentation

## 2022-06-25 HISTORY — DX: Otitis media, unspecified, unspecified ear: H66.90

## 2022-06-25 HISTORY — DX: Gross hematuria: R31.0

## 2022-06-25 LAB — CBC
HCT: 42.9 % (ref 39.0–52.0)
Hemoglobin: 14.1 g/dL (ref 13.0–17.0)
MCHC: 32.9 g/dL (ref 30.0–36.0)
MCV: 95.6 fl (ref 78.0–100.0)
Platelets: 296 10*3/uL (ref 150.0–400.0)
RBC: 4.49 Mil/uL (ref 4.22–5.81)
RDW: 13 % (ref 11.5–15.5)
WBC: 5.3 10*3/uL (ref 4.0–10.5)

## 2022-06-25 LAB — POCT URINALYSIS DIPSTICK
Bilirubin, UA: NEGATIVE
Blood, UA: NEGATIVE
Glucose, UA: NEGATIVE
Ketones, UA: NEGATIVE
Leukocytes, UA: NEGATIVE
Nitrite, UA: NEGATIVE
Protein, UA: NEGATIVE
Spec Grav, UA: >=1.03 — AB (ref 1.010–1.025)
Urobilinogen, UA: 0.2 E.U./dL
pH, UA: 6 (ref 5.0–8.0)

## 2022-06-25 LAB — COMPREHENSIVE METABOLIC PANEL
ALT: 20 U/L (ref 0–53)
AST: 21 U/L (ref 0–37)
Albumin: 4.2 g/dL (ref 3.5–5.2)
Alkaline Phosphatase: 69 U/L (ref 39–117)
BUN: 8 mg/dL (ref 6–23)
CO2: 30 mEq/L (ref 19–32)
Calcium: 9.8 mg/dL (ref 8.4–10.5)
Chloride: 104 mEq/L (ref 96–112)
Creatinine, Ser: 1.18 mg/dL (ref 0.40–1.50)
GFR: 78.97 mL/min (ref >60.00)
Glucose, Bld: 92 mg/dL (ref 70–99)
Potassium: 4.1 mEq/L (ref 3.5–5.1)
Sodium: 143 mEq/L (ref 135–145)
Total Bilirubin: 0.2 mg/dL (ref 0.2–1.2)
Total Protein: 6.9 g/dL (ref 6.0–8.3)

## 2022-06-25 LAB — LIPID PANEL
Cholesterol: 171 mg/dL (ref 0–200)
HDL: 59.4 mg/dL (ref >39.00)
LDL Cholesterol: 93 mg/dL (ref 0–99)
NonHDL: 111.1
Total CHOL/HDL Ratio: 3
Triglycerides: 91 mg/dL (ref 0.0–149.0)
VLDL: 18.2 mg/dL (ref 0.0–40.0)

## 2022-06-25 LAB — IBC + FERRITIN
Ferritin: 14.6 ng/mL — ABNORMAL LOW (ref 22.0–322.0)
Iron: 73 ug/dL (ref 42–165)
Saturation Ratios: 23.3 % (ref 20.0–50.0)
TIBC: 313.6 ug/dL (ref 250.0–450.0)
Transferrin: 224 mg/dL (ref 212.0–360.0)

## 2022-06-25 LAB — T4, FREE: Free T4: 0.74 ng/dL (ref 0.60–1.60)

## 2022-06-25 LAB — TSH: TSH: 3.65 u[IU]/mL (ref 0.35–5.50)

## 2022-06-25 MED ORDER — AMOXICILLIN-POT CLAVULANATE 875-125 MG PO TABS
1.0000 | ORAL_TABLET | Freq: Two times a day (BID) | ORAL | 0 refills | Status: DC
  Filled 2022-06-25: qty 20, 10d supply, fill #0

## 2022-06-25 MED ORDER — KETOROLAC TROMETHAMINE 10 MG PO TABS
10.0000 mg | ORAL_TABLET | Freq: Four times a day (QID) | ORAL | 0 refills | Status: DC | PRN
  Filled 2022-06-25: qty 20, 5d supply, fill #0

## 2022-06-25 MED ORDER — PHENYTOIN SODIUM EXTENDED 100 MG PO CAPS
300.0000 mg | ORAL_CAPSULE | Freq: Every day | ORAL | 0 refills | Status: DC
  Filled 2022-06-25: qty 90, 30d supply, fill #0

## 2022-06-25 MED ORDER — LEVETIRACETAM 500 MG PO TABS
1000.0000 mg | ORAL_TABLET | Freq: Two times a day (BID) | ORAL | 0 refills | Status: DC
  Filled 2022-06-25: qty 120, 30d supply, fill #0

## 2022-06-25 MED ORDER — SIMPLY SALINE 0.9 % NA AERS
2.0000 | INHALATION_SPRAY | Freq: Every day | NASAL | 1 refills | Status: DC | PRN
  Filled 2022-06-25: qty 500, fill #0

## 2022-06-25 NOTE — Assessment & Plan Note (Signed)
Advised patient to use saline nasal rinses Encouraged patient to to discontinue cocaine snorting Sent antibiotic(s). Severe otosclerosis bilaterally

## 2022-06-25 NOTE — Assessment & Plan Note (Signed)
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

## 2022-06-25 NOTE — Assessment & Plan Note (Signed)
Sounds lifelong, worsened by substance use disorder Will get neurology support

## 2022-06-25 NOTE — Patient Instructions (Signed)
[place], [place]: Arts administrator   Individualized Support: [place]: [employer] connecting people with transportation resources. Dial 211 or visit PokerAddress.es.  Dial 2-1-1 or [phone]. They can help you find transportation options in your area based on your specific needs.  Public Transportation:  [employer] (GTA): Operates a fixed-route bus system with over 19 routes serving most areas of [place]. Offers real-time bus tracking and trip planning tools through their website and app. Provides ADA-compliant buses with ramps and designated seating for individuals with disabilities. Sells day passes, weekly passes, and monthly passes to make riding more affordable. Offers reduced fares for seniors (38+) and individuals with disabilities who meet eligibility requirements.   Offers paratransit SCAT (Shared-Ride Cab Alternative): for eligible riders with disabilities who cannot use fixed-route buses due to their disability. Phone number: [phone] https://www.[place].gov/departments/transit  [place] curb-to-curb bus service. Eligibility: Open to all. FeesVary, depending on service and route. There is no fee for people age 38 and over. Visit website to download application or call to have one mailed. Call to check for eligibility. BingoPublishing.hu Phone: [phone] (Toll-Free) or [phone] (Main)  PART ([place]):  Operates commuter bus routes connecting [place], [place], [place], and other areas in the [place] region.  Offers weekday service with limited Saturday service on some routes.  JokeRule.co.nz  Phone number: [phone]   Volunteer Transportation Services:  Chemical engineer (VTG): Provides  non-emergency medical transportation services for seniors and individuals with disabilities who have difficulty accessing traditional transportation means. Serves residents of [place], [place]. Offers rides to medical appointments, grocery shopping, and other essential errands. May require scheduling rides in advance due to volunteer availability. Contact VTG through their website.  https://[email or web address].[email or web address]   [employer] (GUM): Offers transportation assistance for some programs and services they provide, such as their food pantry or medical clinic. Availability and eligibility might vary depending on the specific program. Contact GUM directly for details on transportation assistance for their programs.  https://www.greensborourbanministry.org/ Phone number: [phone]  [place] Programs:  [place] (DSS): May offer transportation vouchers or reimbursements for medical appointments in certain circumstances, particularly for Medicaid recipients. Eligibility depends on individual circumstances, medical needs, and program availability. Contact DSS for details on eligibility requirements and the application process. VariantTest.co.uk [employer] or religious charities:  Some faith-based or local community organizations may offer limited ride-sharing assistance for essential needs like medical appointments or grocery shopping. Availability and eligibility criteria vary greatly. Research local organizations in your area to see if they offer transportation assistance programs.  [place]  Medicare Medicare itself typically  does not directly cover non-emergency medical transportation (NEMT) for doctor appointments.  This means Original Medicare (Parts A & B) usually won't pay for rides to  routine checkups or doctor visits.  However, there are some exceptions:  Ambulance transport in emergencies would likely be covered by Medicare Part B. Some Medicare Advantage Plans (Part C): These are private insurance plans that may cover non-emergency medical transportation as part of their benefits package. It's important to check with your specific Medicare Advantage plan to see if NEMT is covered and what the limitations might be.  Medicaid: Medicaid programs are administered by each state, so coverage can vary. However, Medicaid generally does cover non-emergency medical transportation for eligible individuals to get to and from doctor's appointments for Medicaid-covered services. This means Medicaid may pay for rides to doctor's  offices, hospitals, or other healthcare providers for approved treatments. Here are some resources to learn more:  Medicare Transportation: https://www.ehealthinsurance.com/medicare/ Let Medicaid Give You a Ride: http://www.lawrence.com/   Remember:  Always  check with your specific health insurance plan (Medicare Advantage, commercial, or Medicaid) to confirm  coverage details for non-emergency medical transportation.**    Food Resources   Information on Locations: [place]: [employer] connecting people with various assistance programs, including food support. Dial 2-1-1 or [phone] to speak with a representative for personalized guidance on finding food resources. PokerAddress.es Feeding America: Warden/ranger with a network of food banks across the country. Their website allows you to search for food pantries and meal programs by zip code. https://www.GolfCrawler.com.cy Second [place]: GroupRules.gl  keeps an [place] regionally [place] (DSS): VariantTest.co.uk http://knight.com/ Provides monthly benefits on an EBT card to help buy food.    Soup [employer] and [employer] in [place]: Free Indeed Outreach Ministry [place] [address] [phone] M - F: times and dates vary - call agency  [place]:  [address] [phone] a free community meal program along with other support services  [employer] [address] [place] [address] [phone] Provides hot meals, groceries, and clothing [employer] Dish and [place] [phone] Www.fpcgreensboro.org for mealtimes  [employer] Potter's Western & Southern Financial Pantries in [place]: GroupRules.gl  keeps an [employer] of the food services regionally [place] of His [place] Sa: 10a - 12p  on varying weeks; call agency for schedule  [facility] [place] [phone] M - F: 9:30a - 3:30p, eligibility requirements  The [place] [phone] Tu: 9a - 12p; Th 9a - 12p  Serina Cowper of Praise [address] [phone] Tu : 10a - 12p; W: 10a - 12p; Th: 10a - 12p  [employer] [address] [phone] Tu: [place]; Th[place]ers [place] - 11a  [facility] [address] [phone] 1st & 3rd Sa: [place] by appointment Sa: 10:30a - 11:30a  Out of the [place] [address] [place] [address] [place] [phone] mobile distribution sites; call agency for listing  MBL-Out of the [place] [address]  [place] [phone] mobile distribution sites; call agency for listing  MBL-Out of the [place] [address] [phone] mobile distribution sites; call agency for listing  [facility] [address] [phone] W: [place] [address] [phone] 1st & 3rd Tu: 10a - 1p; 3rd Sa: 10a - 12p  One Step Further [place] M: 9:30a - 2:30p; Tu: 9:30a - 2:30p; W: 9:30a - 2:30p; Th: 9:30a - 2:30p  One Step Further [address] [phone] F: 11a - 2p  [place] Pantry [phone] [address] [id number] 2nd & 4th Th: 1p - 2:30p  [place] [address] [phone] T: 12:p -2p  Free Indeed Food Pantry [place] 3rd Sat of month:  11a-1p  [employer] Pantry [place] [phone] 4th Sa: [place] [address] [place] Anne Wilkinson 3rd Tu: 5p - 7p; 3rd Sa: [place] of Our Father [address]ger [place] [phone] 2nd M 5:30p - 7:30p; each W 9:30a - 11:30a  [facility] of [place] [phone] Tues 11a-1p call for appointment preferred  MBL - [facility] [address] [phone] mobile distribution sites; call agency for listing  Positive [place]irection for Youth & Families [address] [id number] M & W 6:30p - 8:30p  Pop up on Th 11a - 12:30p at different locations (call)  MBL-PD&Y [place] Thursdays 11a - 12p (please call for location)  [facility] Grocery GiveAway [address] [phone] weekly on [address] [place] [phone] 4th Saturday: [place] [facility] [place] M-F 8am-5pm  (by appt)  [facility]-[place]-Infectious Disease [place], [address] [place] [phone] M-F 8:30am-5:00pm (by appt)  [address] [place] [phone] 2nd & 4th Saturday 11am-1pm  [facility] Are One [place] [facility] [phone] Fridays 11am-2pm  [facility] Police Dept [id number] [phone] Call for schedule  [place]fer [place] Alpha [id number], 3rd Thursday 2:30-3:30  [place] [id number] Thursdays 10am-12:30pm  [facility] [place] [id number] Sa: 9a-12p (2nd and 4th Saturday of each month)  Bread of Life [place] [phone] [place]  Every third [place]  [address]  ([phone]) [phone].   Tuesdays from 5:30 PM to 7:30 PM and Saturdays from 10:00 AM to 12:00 PM.  Mobile Market ([place]  [place] Food pantry is healthy food, they want to get appointments on healthy eating.   Mobile Food Pantries: Higher education careers adviser (Out of the [place] [employer]): Offers a Financial risk analyst with fresh produce, bread, meat, and non-perishable food. (Address: [place], [place], [place] - Phone number not available) [employer] [place], [place]): Wednesdays from 11:30 AM to 1:00 PM. Phone number: [phone] (https://www.freefood.org/l/[place]-presbyterian-church) Bread of Life Food Bank ([place], [place]): Located at [address] [place], [place] [address]. Phone number: [phone]. Family Market ([place], [place]): Located at [address], [place], [place] [address]. Phone number: [phone]. Tuesdays from 5:30 PM to 7:30 PM and Saturdays from 10:00 AM to 12:00 PM. Database administrator of [employer]: While not located in [place], they offer a Financial risk analyst program that may serve some [place] residents. Contact for details on eligibility and service areas. - Phone number: [phone] ([email or web address]/in-home-senior-care-services/)
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
## 2022-06-26 ENCOUNTER — Telehealth: Payer: Self-pay | Admitting: Internal Medicine

## 2022-06-26 ENCOUNTER — Encounter: Payer: Self-pay | Admitting: Internal Medicine

## 2022-06-26 DIAGNOSIS — R82998 Other abnormal findings in urine: Secondary | ICD-10-CM | POA: Insufficient documentation

## 2022-06-26 LAB — URINALYSIS, COMPLETE
Bacteria, UA: NONE SEEN /HPF
Bilirubin Urine: NEGATIVE
Glucose, UA: NEGATIVE
Hgb urine dipstick: NEGATIVE
Hyaline Cast: NONE SEEN /LPF
Ketones, ur: NEGATIVE
Leukocytes,Ua: NEGATIVE
Nitrite: NEGATIVE
Protein, ur: NEGATIVE
Specific Gravity, Urine: 1.022 (ref 1.001–1.035)
Squamous Epithelial / HPF: NONE SEEN /HPF (ref <=5)
WBC, UA: NONE SEEN /HPF (ref 0–5)
pH: 5.5 (ref 5.0–8.0)

## 2022-06-26 LAB — PHENYTOIN LEVEL, TOTAL: Phenytoin, Total: <2.5 mg/L — ABNORMAL LOW (ref 10.0–20.0)

## 2022-06-26 LAB — URINE CYTOLOGY ANCILLARY ONLY
Chlamydia: NEGATIVE
Comment: NEGATIVE
Comment: NEGATIVE
Comment: NORMAL
Neisseria Gonorrhea: NEGATIVE
Trichomonas: NEGATIVE

## 2022-06-26 LAB — HEPATITIS C ANTIBODY: Hepatitis C Ab: NONREACTIVE

## 2022-06-26 LAB — HIV ANTIBODY (ROUTINE TESTING W REFLEX): HIV 1&2 Ab, 4th Generation: NONREACTIVE

## 2022-06-26 NOTE — Progress Notes (Signed)
Given the chronicity of symptoms and the presence of yeast in the urine of a male patient, it is advisable to further evaluate the significance of the yeast finding. This includes confirming the presence of yeast with a urine culture and assessing for underlying predisposing factors. Treatment should be considered if the infection is confirmed and is contributing to the patient's symptoms, taking into account the patient's overall health and potential risks of antifungal therapy.   Also there are tiny calcium stones in urine so important to stay more hydrated/drink more fluid. Send handout on preventing kidney stones  Please place any orders needed / requested. Arrange/confirm follow up appt(s).

## 2022-06-26 NOTE — Telephone Encounter (Signed)
All meds that were sent yesterday were sent to the wrong pharmacy, they should have been sent to  [facility] [place] [facility] [place] Phone: [phone]  Fax: [phone]      [facility] was not on his pharmacy list.

## 2022-06-26 NOTE — Telephone Encounter (Signed)
Patient wanting an update. States can send RX's straight to [facility] at [employer].   The following RX's have not been received by Patient's Preferred Pharmacy (listed in previous message):  amoxicillin-clavulanate  ketorolac  levETIRAcetam  phenytoin  Simply Saline

## 2022-06-27 NOTE — Telephone Encounter (Signed)
Pt called again and wanted to know the process of fixing the meds issue that was sent to the wrong pharmacy. Please advise.

## 2022-06-28 ENCOUNTER — Other Ambulatory Visit: Payer: Self-pay

## 2022-06-28 DIAGNOSIS — G40909 Epilepsy, unspecified, not intractable, without status epilepticus: Secondary | ICD-10-CM

## 2022-06-28 DIAGNOSIS — H6690 Otitis media, unspecified, unspecified ear: Secondary | ICD-10-CM

## 2022-06-28 MED ORDER — LEVETIRACETAM 500 MG PO TABS: 1000.0000 mg | ORAL_TABLET | Freq: Two times a day (BID) | ORAL | 0 refills | Status: DC

## 2022-06-28 MED ORDER — AMOXICILLIN-POT CLAVULANATE 875-125 MG PO TABS: 1.0000 | ORAL_TABLET | Freq: Two times a day (BID) | ORAL | 0 refills | Status: AC

## 2022-06-28 MED ORDER — SIMPLY SALINE 0.9 % NA AERS: 2.0000 | INHALATION_SPRAY | Freq: Every day | NASAL | 1 refills | Status: AC | PRN

## 2022-06-28 MED ORDER — PHENYTOIN SODIUM EXTENDED 100 MG PO CAPS: 300.0000 mg | ORAL_CAPSULE | Freq: Every day | ORAL | 0 refills | Status: DC

## 2022-06-28 MED ORDER — KETOROLAC TROMETHAMINE 10 MG PO TABS: 10.0000 mg | ORAL_TABLET | Freq: Four times a day (QID) | ORAL | 0 refills | Status: AC | PRN

## 2022-06-28 NOTE — Telephone Encounter (Signed)
Sent all prescriptions to requested pharmacy. Spoke with patient and he informed me that he was able to pick up all of these medications from the initial pharmacy that they were sent to.

## 2022-07-02 ENCOUNTER — Encounter (HOSPITAL_COMMUNITY): Payer: Self-pay

## 2022-07-02 ENCOUNTER — Other Ambulatory Visit: Payer: Self-pay

## 2022-07-02 ENCOUNTER — Emergency Department (HOSPITAL_COMMUNITY): Payer: Medicaid Other

## 2022-07-02 ENCOUNTER — Emergency Department (HOSPITAL_COMMUNITY)
Admission: EM | Admit: 2022-07-02 | Discharge: 2022-07-02 | Disposition: A | Discharge status: Home / Self Care | Payer: Medicaid Other | Attending: Emergency Medicine | Admitting: Emergency Medicine

## 2022-07-02 DIAGNOSIS — S86011A Strain of right Achilles tendon, initial encounter: Secondary | ICD-10-CM | POA: Insufficient documentation

## 2022-07-02 DIAGNOSIS — W1830XA Fall on same level, unspecified, initial encounter: Secondary | ICD-10-CM | POA: Insufficient documentation

## 2022-07-02 DIAGNOSIS — Y92039 Unspecified place in apartment as the place of occurrence of the external cause: Secondary | ICD-10-CM | POA: Insufficient documentation

## 2022-07-02 DIAGNOSIS — M25571 Pain in right ankle and joints of right foot: Secondary | ICD-10-CM | POA: Diagnosis present

## 2022-07-02 MED ORDER — ACETAMINOPHEN 500 MG PO TABS
1000.0000 mg | ORAL_TABLET | Freq: Once | ORAL | Status: AC
  Administered 2022-07-02: 1000 mg via ORAL
  Filled 2022-07-02: qty 2

## 2022-07-02 NOTE — ED Triage Notes (Signed)
Patient arrived via EMS due to right ankle pain. Reports trying to get into the shower this morning and slipping and twisting ankle. EMS reports that patient is able to put minimal weight on the ankle.

## 2022-07-02 NOTE — Discharge Instructions (Addendum)
It seems that you strained your Achilles tendon.  You can follow-up with orthopedist if not improving.  Otherwise use the Toradol that was prescribed to you a couple days ago and you can also take Tylenol.  If you are unable to bear weight, develop new or worsening pain, or any other new/concerning symptoms and return to the ER or call 911.

## 2022-07-02 NOTE — ED Notes (Signed)
Ace wrap applied to the right ankle. Crutches given and demonstrated to patient on how to use them.

## 2022-07-02 NOTE — ED Provider Notes (Signed)
EMERGENCY DEPARTMENT AT [facility] Provider Note   CSN: [id number] Arrival date & time: 07/02/22  1102     History  Chief Complaint  Patient presents with   Ankle Pain    Martin Cooper is a 37 y.o. male.  HPI 37 year old male presents with right ankle pain.  He points to the posterior aspect at his Achilles tendon.  He states he fell while trying to get in the shower due to construction in his apartment complex.  He thinks he inverted his ankle.  He had a hard time bearing weight with EMS.  He rates the pain as severe.  No other injuries or knee pain.  Home Medications Prior to Admission medications   Medication Sig Start Date End Date Taking? Authorizing Provider  amoxicillin-clavulanate (AUGMENTIN) 875-125 MG tablet Take 1 tablet by mouth 2 (two) times daily. 06/28/22   Loralee Pacas, MD  ketorolac (TORADOL) 10 MG tablet Take 1 tablet (10 mg total) by mouth every 6 (six) hours as needed. 06/28/22   Loralee Pacas, MD  levETIRAcetam (KEPPRA) 500 MG tablet Take 2 tablets (1,000 mg total) by mouth 2 (two) times daily. 06/28/22 07/28/22  Loralee Pacas, MD  phenytoin (DILANTIN) 100 MG ER capsule Take 3 capsules (300 mg total) by mouth at bedtime. 06/28/22 07/28/22  Loralee Pacas, MD  Saline (SIMPLY SALINE) 0.9 % AERS Place 2 each into the nose daily as needed. 06/28/22   Loralee Pacas, MD      Allergies    Patient has no known allergies.    Review of Systems   Review of Systems  Musculoskeletal:  Positive for arthralgias.    Physical Exam Updated Vital Signs BP 126/67   Pulse 60   Temp 98.6 F (37 C) (Oral)   Resp 18   Ht 5\' 10"  (1.778 m)   Wt 80.8 kg   SpO2 100%   BMI 25.56 kg/m  Physical Exam Vitals and nursing note reviewed.  Constitutional:      Appearance: He is well-developed.  HENT:     Head: Normocephalic and atraumatic.  Cardiovascular:     Rate and Rhythm: Normal rate and regular rhythm.     Pulses:          Dorsalis  pedis pulses are 2+ on the right side.  Pulmonary:     Effort: Pulmonary effort is normal.  Musculoskeletal:     Right ankle: No tenderness.     Right Achilles Tendon: Tenderness (mild) present. No defects. Thompson's test negative.     Left ankle:     Left Achilles Tendon: Thompson's test negative.     Right foot: No swelling or tenderness.  Skin:    General: Skin is warm and dry.  Neurological:     Mental Status: He is alert.     ED Results / Procedures / Treatments   Labs (all labs ordered are listed, but only abnormal results are displayed) Labs Reviewed - No data to display  EKG None  Radiology DG Ankle Complete Right  Result Date: 07/02/2022 CLINICAL DATA:  Ankle pain, Achilles injury EXAM: RIGHT ANKLE - COMPLETE 3+ VIEW COMPARISON:  None Available. FINDINGS: There is no evidence of fracture, dislocation, or joint effusion. There is no evidence of arthropathy or other focal bone abnormality. Soft tissues are unremarkable. IMPRESSION: No acute finding by plain radiography.  Stable exam. Electronically Signed   By: Jerilynn Mages.  Shick M.D.   On: 07/02/2022 12:01  Procedures Procedures    Medications Ordered in ED Medications  acetaminophen (TYLENOL) tablet 1,000 mg (1,000 mg Oral Given 07/02/22 1139)    ED Course/ Medical Decision Making/ A&P                             Medical Decision Making Amount and/or Complexity of Data Reviewed External Data Reviewed: notes. Radiology: ordered and independent interpretation performed.    Details: No fractures/dislocations of the ankle  Risk OTC drugs.   I suspect patient does not have a significant Achilles tendon injury.  There is no obvious rupture.  Thompson test does not indicate rupture.  Will give an Ace wrap and crutches and let him weight-bear as tolerated.  Will refer to orthopedics.  Recommend Tylenol and he was given a prescription of NSAIDs by his PCP recently.        Final Clinical Impression(s) / ED  Diagnoses Final diagnoses:  Strain of right Achilles tendon, initial encounter    Rx / DC Orders ED Discharge Orders     None         Sherwood Gambler, MD 07/02/22 1554

## 2022-07-03 ENCOUNTER — Encounter: Payer: Self-pay | Admitting: Neurology

## 2022-08-08 ENCOUNTER — Ambulatory Visit: Payer: Medicaid Other | Admitting: Neurology

## 2022-08-08 ENCOUNTER — Encounter: Payer: Self-pay | Admitting: Neurology

## 2022-08-08 VITALS — BP 124/82 | HR 57 | Ht 70.0 in | Wt 181.6 lb

## 2022-08-08 DIAGNOSIS — G40909 Epilepsy, unspecified, not intractable, without status epilepticus: Secondary | ICD-10-CM

## 2022-08-08 MED ORDER — LEVETIRACETAM 1000 MG PO TABS: 1000.0000 mg | ORAL_TABLET | Freq: Two times a day (BID) | ORAL | 11 refills | Status: DC

## 2022-08-08 MED ORDER — PHENYTOIN SODIUM EXTENDED 100 MG PO CAPS: 300.0000 mg | ORAL_CAPSULE | Freq: Every day | ORAL | 11 refills | Status: DC
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
## 2022-08-09 ENCOUNTER — Other Ambulatory Visit: Payer: Medicaid Other

## 2022-08-20 ENCOUNTER — Emergency Department (HOSPITAL_COMMUNITY): Payer: Medicaid Other

## 2022-08-20 ENCOUNTER — Encounter (HOSPITAL_COMMUNITY): Payer: Self-pay

## 2022-08-20 ENCOUNTER — Other Ambulatory Visit: Payer: Self-pay

## 2022-08-20 ENCOUNTER — Emergency Department (HOSPITAL_COMMUNITY)
Admission: EM | Admit: 2022-08-20 | Discharge: 2022-08-20 | Disposition: A | Discharge status: Home / Self Care | Payer: Medicaid Other | Attending: Emergency Medicine | Admitting: Emergency Medicine

## 2022-08-20 DIAGNOSIS — R519 Headache, unspecified: Secondary | ICD-10-CM | POA: Diagnosis present

## 2022-08-20 DIAGNOSIS — S069X9A Unspecified intracranial injury with loss of consciousness of unspecified duration, initial encounter: Secondary | ICD-10-CM | POA: Diagnosis not present

## 2022-08-20 DIAGNOSIS — W2103XA Struck by baseball, initial encounter: Secondary | ICD-10-CM | POA: Diagnosis not present
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
## 2022-08-20 NOTE — Progress Notes (Signed)
Orthopedic Tech Progress Note Patient Details:  Martin Cooper 03/23/1986 [id number]  Ortho Devices Type of Ortho Device: Postop shoe/boot, Crutches Ortho Device/Splint Location: RLE Ortho Device/Splint Interventions: Ordered, Application, Adjustment   Post Interventions Patient Tolerated: Well, Ambulated well Instructions Provided: Care of device  Donald Pore 08/20/2022, 1:04 PM

## 2022-08-20 NOTE — ED Notes (Signed)
Patient transported to X-ray 

## 2022-08-20 NOTE — Discharge Instructions (Addendum)
Martin Cooper,  After you arrived to the emergency room, we performed imaging of your head and neck. These images were all normal. You experienced a concussion without any brain bleeding or neck fracture.   Additionally, we performed an Xray of your right toe. This imaging test was also negative. Your toe is swollen and painful, but the bone is fully intact without any fractures. We are sending you home with a hard-bottom boot and crutches to help the healing process.  Please follow up with your neurologist and start taking your seizure medications more regularly. This will help prevent you from experiencing another seizure. Avoid driving for at least six months.
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
## 2022-08-31 ENCOUNTER — Encounter: Payer: Self-pay | Admitting: Internal Medicine

## 2022-08-31 ENCOUNTER — Ambulatory Visit (INDEPENDENT_AMBULATORY_CARE_PROVIDER_SITE_OTHER): Payer: Medicaid Other | Admitting: Internal Medicine

## 2022-08-31 ENCOUNTER — Ambulatory Visit (INDEPENDENT_AMBULATORY_CARE_PROVIDER_SITE_OTHER): Payer: Medicaid Other

## 2022-08-31 VITALS — BP 116/78 | HR 66 | Temp 98.0°F | Ht 68.0 in | Wt 207.2 lb

## 2022-08-31 DIAGNOSIS — R82998 Other abnormal findings in urine: Secondary | ICD-10-CM

## 2022-08-31 DIAGNOSIS — R109 Unspecified abdominal pain: Secondary | ICD-10-CM

## 2022-08-31 DIAGNOSIS — R310 Gross hematuria: Secondary | ICD-10-CM

## 2022-08-31 DIAGNOSIS — M79641 Pain in right hand: Secondary | ICD-10-CM

## 2022-08-31 DIAGNOSIS — M79643 Pain in unspecified hand: Secondary | ICD-10-CM

## 2022-08-31 DIAGNOSIS — B3749 Other urogenital candidiasis: Secondary | ICD-10-CM | POA: Insufficient documentation

## 2022-08-31 DIAGNOSIS — F819 Developmental disorder of scholastic skills, unspecified: Secondary | ICD-10-CM

## 2022-08-31 HISTORY — DX: Pain in unspecified hand: M79.643
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
## 2022-08-31 NOTE — Assessment & Plan Note (Signed)
Suspected based on urinalysis and dysuria.  Will recheck and avoid antifungals due to seizure medication(s) interaction until confirmed. Follow up 1 month, and set up my chart to intervene sooner based on results of imaging/lab results. If yeast is again seen in urine, will order diflucan to treat it, and will need phenytoin level monitoring.

## 2022-08-31 NOTE — Assessment & Plan Note (Signed)
To prevent calcium oxalate stones, the patient should maintain adequate dietary calcium, limit foods high in oxalates, increase fluid intake to ensure a daily urine output of at least 2.5 liters, and reduce sodium intake. These dietary modifications, combined with regular medical follow-up to monitor his condition and response to these changes, can significantly reduce the risk of future calcium oxalate stone formation.

## 2022-08-31 NOTE — Patient Instructions (Addendum)
It was a pleasure seeing you today! Your health and satisfaction are our top priorities.   Glenetta Hew, MD  Next Steps:  [x]  Early Intervention: Schedule sooner appointment, call our on-call services, or go to emergency room if there is Increase in pain or discomfort New or worsening symptoms Sudden or severe changes in your health [x]  Flexible Follow-Up: We recommend a Return in about 1 month (around 10/01/2022) for chronic disease monitoring and management. for optimal routine care. This allows for progress monitoring and treatment adjustments. [x]  Preventive Care: Schedule your annual preventive care visit! It's typically covered by insurance and helps identify potential health issues early. [x]  Lab & X-ray Appointments: Incomplete tests scheduled today, or call to schedule. X-rays: [facility] (M-F, 8:30am-noon or 1pm-5pm). [x]  Medical Information Release: Sign a release form at front desk to obtain relevant medical information we don't have.  Making the Most of Our Focused (20 minute) Appointments:  [x]   Clearly state your top concerns at the beginning of the visit to focus our discussion [x]   If you anticipate you will need more time, please inform the front desk during scheduling - we can book multiple appointments in the same week. [x]   If you have transportation problems- use our convenient video appointments or ask about transportation support. [x]   We can get down to business faster if you use MyChart to update information before the visit and submit non-urgent questions before your visit. Thank you for taking the time to provide details through MyChart.  Let our nurse know and she can import this information into your encounter documents.  Arrival and Wait Times: [x]   Arriving on time ensures that everyone receives prompt attention. [x]   Early morning (8a) and afternoon (1p) appointments tend to have shortest wait times. [x]   Unfortunately, we cannot delay  appointments for late arrivals or hold slots during phone calls.  Getting Answers and Following Up  [x]   Simple Questions & Concerns: For quick questions or basic follow-up after your visit, reach [place] at [phone] or MyChart messaging. [x]   Complex Concerns: If your concern is more complex, scheduling an appointment might be best. Discuss this with the staff to find the most suitable option. [x]   Lab & Imaging Results: We'll contact you directly if results are abnormal or you don't use MyChart. Most normal results will be on MyChart within 2-3 business days, with a review message from Dr. [name]. Haven't heard back in 2 weeks? Need results sooner? Contact us at [phone]. [x]   Referrals: Our referral coordinator will manage specialist referrals. The specialist's office should contact you within 2 weeks to schedule an appointment. Call us if you haven't heard from them after 2 weeks.  Staying Connected  [x]   MyChart: Activate your MyChart for the fastest way to access results and message [place]. See the last page of this paperwork for instructions on how to activate.  Bring to Your Next Appointment  [x]   Medications: Please bring all your medication bottles to your next appointment to ensure we have an accurate record of your prescriptions. [x]   Health Diaries: If you're monitoring any health conditions at home, keeping a diary of your readings can be very helpful for discussions at your next appointment.  Billing  [x]   X-ray & Lab Orders: These are billed by separate companies. Contact the invoicing company directly for questions or concerns. [x]   Visit Charges: Discuss any billing inquiries with our administrative services team.  Your Satisfaction Matters  [x]   Share Your Experience: We strive for your satisfaction! If you have any complaints, or preferably compliments, please let Dr. [name] know directly or contact our Practice Administrators, Edwena Felty or [employer], by  asking at the front desk.   Reviewing Your Records  [x]   Review this early draft of your clinical encounter notes below and the final encounter summary tomorrow on MyChart after its been completed.   Intermittent gross hematuria -     DG Abd 2 Views; Future -     Calcium, 24-Hour Urine with Creatinine -     Oxalate, Quant, 24-Hour Urine; Future -     Urinalysis, Complete -     Urine Culture -     US RENAL; Future -     Urine cytology ancillary only  Calcium oxalate crystals in urine Assessment & Plan: To prevent calcium oxalate stones, the patient should maintain adequate dietary calcium, limit foods high in oxalates, increase fluid intake to ensure a daily urine output of at least 2.5 liters, and reduce sodium intake. These dietary modifications, combined with regular medical follow-up to monitor his condition and response to these changes, can significantly reduce the risk of future calcium oxalate stone formation.   Orders: -     DG Abd 2 Views; Future -     Calcium, 24-Hour Urine with Creatinine -     Oxalate, Quant, 24-Hour Urine; Future -     Urinalysis, Complete -     Urine Culture -     US RENAL; Future -     Urine cytology ancillary only  Flank pain -     DG Abd 2 Views; Future -     Calcium, 24-Hour Urine with Creatinine -     Oxalate, Quant, 24-Hour Urine; Future -     Urinalysis, Complete -     Urine Culture -     US RENAL; Future -     Urine cytology ancillary only  Pain of right hand -     DG Hand Complete Right; Future -     Ambulatory referral to Orthopedic Surgery

## 2022-08-31 NOTE — Assessment & Plan Note (Signed)
He didn't feel comfortable with committing to mychart use, but agreed to let his mother manage it so I sent her the code to set it up on her phone.

## 2022-09-01 ENCOUNTER — Other Ambulatory Visit: Payer: Self-pay

## 2022-09-01 ENCOUNTER — Encounter (HOSPITAL_COMMUNITY): Payer: Self-pay

## 2022-09-01 ENCOUNTER — Emergency Department (HOSPITAL_COMMUNITY)
Admission: EM | Admit: 2022-09-01 | Discharge: 2022-09-01 | Disposition: A | Discharge status: Home / Self Care | Payer: Medicaid Other | Attending: Emergency Medicine | Admitting: Emergency Medicine

## 2022-09-01 ENCOUNTER — Emergency Department (HOSPITAL_COMMUNITY): Payer: Medicaid Other

## 2022-09-01 DIAGNOSIS — R109 Unspecified abdominal pain: Secondary | ICD-10-CM

## 2022-09-01 DIAGNOSIS — M545 Low back pain, unspecified: Secondary | ICD-10-CM | POA: Diagnosis not present

## 2022-09-01 DIAGNOSIS — R300 Dysuria: Secondary | ICD-10-CM | POA: Insufficient documentation

## 2022-09-01 DIAGNOSIS — R1032 Left lower quadrant pain: Secondary | ICD-10-CM | POA: Insufficient documentation

## 2022-09-01 HISTORY — DX: Developmental disorder of scholastic skills, unspecified: F81.9

## 2022-09-01 LAB — CBC WITH DIFFERENTIAL/PLATELET
Abs Immature Granulocytes: 0.02 10*3/uL (ref 0.00–0.07)
Basophils Absolute: 0.1 10*3/uL (ref 0.0–0.1)
Basophils Relative: 1 %
Eosinophils Absolute: 0.1 10*3/uL (ref 0.0–0.5)
Eosinophils Relative: 1 %
HCT: 41.9 % (ref 39.0–52.0)
Hemoglobin: 13.4 g/dL (ref 13.0–17.0)
Immature Granulocytes: 0 %
Lymphocytes Relative: 43 %
Lymphs Abs: 2.7 10*3/uL (ref 0.7–4.0)
MCH: 30.8 pg (ref 26.0–34.0)
MCHC: 32 g/dL (ref 30.0–36.0)
MCV: 96.3 fL (ref 80.0–100.0)
Monocytes Absolute: 0.4 10*3/uL (ref 0.1–1.0)
Monocytes Relative: 7 %
Neutro Abs: 2.9 10*3/uL (ref 1.7–7.7)
Neutrophils Relative %: 48 %
Platelets: 301 10*3/uL (ref 150–400)
RBC: 4.35 MIL/uL (ref 4.22–5.81)
RDW: 11.9 % (ref 11.5–15.5)
WBC: 6.2 10*3/uL (ref 4.0–10.5)
nRBC: 0 % (ref 0.0–0.2)

## 2022-09-01 LAB — URINE CULTURE
MICRO NUMBER:: 14971646
Result:: NO GROWTH
SPECIMEN QUALITY:: ADEQUATE

## 2022-09-01 LAB — URINALYSIS, COMPLETE
Bacteria, UA: NONE SEEN /HPF
Bilirubin Urine: NEGATIVE
Glucose, UA: NEGATIVE
Hgb urine dipstick: NEGATIVE
Hyaline Cast: NONE SEEN /LPF
Ketones, ur: NEGATIVE
Leukocytes,Ua: NEGATIVE
Nitrite: NEGATIVE
Protein, ur: NEGATIVE
RBC / HPF: NONE SEEN /HPF (ref 0–2)
Specific Gravity, Urine: 1.015 (ref 1.001–1.035)
Squamous Epithelial / HPF: NONE SEEN /HPF (ref <=5)
WBC, UA: NONE SEEN /HPF (ref 0–5)
pH: 6 (ref 5.0–8.0)

## 2022-09-01 LAB — URINALYSIS, ROUTINE W REFLEX MICROSCOPIC
Bilirubin Urine: NEGATIVE
Glucose, UA: NEGATIVE mg/dL
Hgb urine dipstick: NEGATIVE
Ketones, ur: NEGATIVE mg/dL
Leukocytes,Ua: NEGATIVE
Nitrite: NEGATIVE
Protein, ur: NEGATIVE mg/dL
Specific Gravity, Urine: 1.011 (ref 1.005–1.030)
pH: 6 (ref 5.0–8.0)

## 2022-09-01 LAB — COMPREHENSIVE METABOLIC PANEL
ALT: 16 U/L (ref 0–44)
AST: 21 U/L (ref 15–41)
Albumin: 3.7 g/dL (ref 3.5–5.0)
Alkaline Phosphatase: 55 U/L (ref 38–126)
Anion gap: 8 (ref 5–15)
BUN: 10 mg/dL (ref 6–20)
CO2: 23 mmol/L (ref 22–32)
Calcium: 9.1 mg/dL (ref 8.9–10.3)
Chloride: 106 mmol/L (ref 98–111)
Creatinine, Ser: 1.07 mg/dL (ref 0.61–1.24)
GFR, Estimated: >60 mL/min (ref >60)
Glucose, Bld: 94 mg/dL (ref 70–99)
Potassium: 4.3 mmol/L (ref 3.5–5.1)
Sodium: 137 mmol/L (ref 135–145)
Total Bilirubin: 0.8 mg/dL (ref 0.3–1.2)
Total Protein: 6.9 g/dL (ref 6.5–8.1)

## 2022-09-01 LAB — LIPASE, BLOOD: Lipase: 51 U/L (ref 11–51)

## 2022-09-01 MED ORDER — DICYCLOMINE HCL 20 MG PO TABS: 20.0000 mg | ORAL_TABLET | Freq: Two times a day (BID) | ORAL | 0 refills | Status: AC

## 2022-09-01 MED ORDER — ONDANSETRON HCL 4 MG/2ML IJ SOLN
4.0000 mg | Freq: Once | INTRAMUSCULAR | Status: AC
  Administered 2022-09-01: 4 mg via INTRAVENOUS
  Filled 2022-09-01: qty 2

## 2022-09-01 MED ORDER — MORPHINE SULFATE (PF) 2 MG/ML IV SOLN
2.0000 mg | Freq: Once | INTRAVENOUS | Status: AC
  Administered 2022-09-01: 2 mg via INTRAVENOUS
  Filled 2022-09-01: qty 1

## 2022-09-01 MED ORDER — SODIUM CHLORIDE 0.9 % IV BOLUS
1000.0000 mL | Freq: Once | INTRAVENOUS | Status: AC
  Administered 2022-09-01: 1000 mL via INTRAVENOUS

## 2022-09-01 MED ORDER — ONDANSETRON HCL 4 MG PO TABS: 4.0000 mg | ORAL_TABLET | Freq: Three times a day (TID) | ORAL | 0 refills | Status: AC | PRN

## 2022-09-01 MED ORDER — DICYCLOMINE HCL 10 MG/ML IM SOLN: 20.0000 mg | Freq: Once | INTRAMUSCULAR | Status: DC

## 2022-09-01 MED ORDER — KETOROLAC TROMETHAMINE 15 MG/ML IJ SOLN
15.0000 mg | Freq: Once | INTRAMUSCULAR | Status: AC
  Administered 2022-09-01: 15 mg via INTRAVENOUS
  Filled 2022-09-01: qty 1

## 2022-09-01 MED ORDER — DICYCLOMINE HCL 10 MG PO CAPS
10.0000 mg | ORAL_CAPSULE | Freq: Once | ORAL | Status: AC
  Administered 2022-09-01: 10 mg via ORAL
  Filled 2022-09-01: qty 1

## 2022-09-01 NOTE — ED Notes (Signed)
Patient transported to CT 

## 2022-09-01 NOTE — Discharge Instructions (Signed)
Thank you for letting us take care of you today.  Overall, your workup was reassuring.  Your urine, blood work, and CT scan were essentially normal.  We gave you multiple medications to help with your pain.  I do think that as you have been having recurring abdominal pain you should follow-up with the gastroenterology specialist or stomach doctor for further evaluation in the clinic.  I provided the information for her doctor on call today.  Please call their office and schedule a follow-up appointment.  I am prescribing medication for home to help with your symptoms including Bentyl which is a medication to help with abdominal pain and Zofran which is a medication to help with nausea.  Please take these only as prescribed.  You may also take over-the-counter medication as needed.  For any new or worsening symptoms, please return to the nearest ED for reevaluation.

## 2022-09-01 NOTE — ED Triage Notes (Signed)
BIBA from home for left side flank pain, abdominal pain, n/v. Pt saw PCP yesterday, where they told him he had blood in his urine, and kidney stones.

## 2022-09-01 NOTE — ED Provider Notes (Signed)
[facility] AT [facility] Provider Note   CSN: [id number] Arrival date & time: 09/01/22  1131     History  Chief Complaint  Patient presents with   Flank Pain    Martin Cooper is a 37 y.o. male with past medical history asthma, learning disability, seizure disorder who presents to the ED complaining of left flank pain.  He reports that he was seen by his PCP yesterday and told he likely has kidney stones.  He has also had dysuria and lower back pain.  No fever, chills, nausea, vomiting, diarrhea, chest pain, or shortness of breath. No previous history of kidney stones but has had recurrent abdominal pain for a long time. Denies imaging since onset of symptoms.       Home Medications Prior to Admission medications   Medication Sig Start Date End Date Taking? Authorizing Provider  dicyclomine (BENTYL) 20 MG tablet Take 1 tablet (20 mg total) by mouth 2 (two) times daily for 5 days. 09/01/22 09/06/22 Yes Calin Fantroy L, PA-C  levETIRAcetam (KEPPRA) 1000 MG tablet Take 1 tablet (1,000 mg total) by mouth 2 (two) times daily. 08/08/22  Yes Van Clines, MD  ondansetron (ZOFRAN) 4 MG tablet Take 1 tablet (4 mg total) by mouth every 8 (eight) hours as needed for up to 5 days for nausea or vomiting. 09/01/22 09/06/22 Yes Venna Berberich L, PA-C  phenytoin (DILANTIN) 100 MG ER capsule Take 3 capsules (300 mg total) by mouth at bedtime. 08/08/22 09/07/22 Yes Van Clines, MD  amoxicillin-clavulanate (AUGMENTIN) 875-125 MG tablet Take 1 tablet by mouth 2 (two) times daily. Patient not taking: Reported on 09/01/2022 06/28/22   Lula Olszewski, MD  ketorolac (TORADOL) 10 MG tablet Take 1 tablet (10 mg total) by mouth every 6 (six) hours as needed. Patient not taking: Reported on 09/01/2022 06/28/22   Lula Olszewski, MD  Saline (SIMPLY SALINE) 0.9 % AERS Place 2 each into the nose daily as needed. Patient not taking: Reported on 09/01/2022 06/28/22   Lula Olszewski,  MD      Allergies    Patient has no known allergies.    Review of Systems   Review of Systems  All other systems reviewed and are negative.   Physical Exam Updated Vital Signs BP 112/66   Pulse 60   Temp 97.9 F (36.6 C) (Oral)   Resp 17   SpO2 99%  Physical Exam Vitals and nursing note reviewed.  Constitutional:      General: He is not in acute distress.    Appearance: Normal appearance.  HENT:     Head: Normocephalic and atraumatic.     Mouth/Throat:     Mouth: Mucous membranes are moist.  Eyes:     Conjunctiva/sclera: Conjunctivae normal.  Cardiovascular:     Rate and Rhythm: Normal rate and regular rhythm.     Heart sounds: No murmur heard. Pulmonary:     Effort: Pulmonary effort is normal. No respiratory distress.     Breath sounds: Normal breath sounds. No stridor. No wheezing, rhonchi or rales.  Abdominal:     General: Abdomen is flat. There is no distension.     Palpations: Abdomen is soft. There is no mass.     Tenderness: There is abdominal tenderness (LLQ). There is left CVA tenderness and guarding (voluntary LLQ). There is no right CVA tenderness or rebound.  Musculoskeletal:        General: Normal range of motion.  Cervical back: Neck supple.     Right lower leg: No edema.     Left lower leg: No edema.  Skin:    General: Skin is warm and dry.     Capillary Refill: Capillary refill takes less than 2 seconds.     Coloration: Skin is not jaundiced or pale.     Findings: No rash.  Neurological:     Mental Status: He is alert. Mental status is at baseline.  Psychiatric:        Behavior: Behavior normal.     ED Results / Procedures / Treatments   Labs (all labs ordered are listed, but only abnormal results are displayed) Labs Reviewed  URINALYSIS, ROUTINE W REFLEX MICROSCOPIC - Abnormal; Notable for the following components:      Result Value   Color, Urine STRAW (*)    All other components within normal limits  CBC WITH DIFFERENTIAL/PLATELET   COMPREHENSIVE METABOLIC PANEL  LIPASE, BLOOD    EKG None  Radiology CT ABDOMEN PELVIS WO CONTRAST  Result Date: 09/01/2022 CLINICAL DATA:  37 year old male with history of left-sided flank pain. EXAM: CT ABDOMEN AND PELVIS WITHOUT CONTRAST TECHNIQUE: Multidetector CT imaging of the abdomen and pelvis was performed following the standard protocol without IV contrast. RADIATION DOSE REDUCTION: This exam was performed according to the departmental dose-optimization program which includes automated exposure control, adjustment of the mA and/or kV according to patient size and/or use of iterative reconstruction technique. COMPARISON:  No priors. FINDINGS: Lower chest: Unremarkable. Hepatobiliary: No suspicious cystic or solid hepatic lesions. No intra or extrahepatic biliary ductal dilatation. Gallbladder is unremarkable in appearance. Pancreas: No pancreatic mass. No pancreatic ductal dilatation. No pancreatic or peripancreatic fluid collections or inflammatory changes. Spleen: Unremarkable. Adrenals/Urinary Tract: No calcifications are identified within the collecting system of either kidney, along the course of either ureter, or within the lumen of the urinary bladder. No hydroureteronephrosis. Urinary bladder is unremarkable in appearance. Stomach/Bowel: The appearance of the stomach is normal. There is no pathologic dilatation of small bowel or colon. Normal appendix. Vascular/Lymphatic: No atherosclerotic calcifications are noted in the thoracic aorta or the coronary arteries. No lymphadenopathy noted in the abdomen or pelvis. Reproductive: Prostate gland and seminal vesicles are unremarkable in appearance. Other: No significant volume of ascites.  No pneumoperitoneum. Musculoskeletal: There are no aggressive appearing lytic or blastic lesions noted in the visualized portions of the skeleton. IMPRESSION: 1. No acute findings are noted in the abdomen or pelvis to account for the patient's symptoms.  Specifically, no urinary tract calculi no findings of urinary tract obstruction. Electronically Signed   By: Trudie Reed M.D.   On: 09/01/2022 12:53    Procedures Procedures    Medications Ordered in ED Medications  morphine (PF) 2 MG/ML injection 2 mg (has no administration in time range)  dicyclomine (BENTYL) capsule 10 mg (has no administration in time range)  ketorolac (TORADOL) 15 MG/ML injection 15 mg (15 mg Intravenous Given 09/01/22 1201)  ondansetron (ZOFRAN) injection 4 mg (4 mg Intravenous Given 09/01/22 1201)  sodium chloride 0.9 % bolus 1,000 mL (1,000 mLs Intravenous New Bag/Given 09/01/22 1200)  morphine (PF) 2 MG/ML injection 2 mg (2 mg Intravenous Given 09/01/22 1322)    ED Course/ Medical Decision Making/ A&P                             Medical Decision Making Amount and/or Complexity of Data Reviewed Labs: ordered. Decision-making details  documented in ED Course. Radiology: ordered. Decision-making details documented in ED Course.  Risk Prescription drug management.   Medical Decision Making:   Martin Cooper is a 37 y.o. male who presented to the ED today with abdominal pain detailed above.    Patient placed on continuous vitals and telemetry monitoring while in ED which was reviewed periodically.  Complete initial physical exam performed, notably the patient  was in no acute distress.  He had moderate left lower quadrant and left flank tenderness to palpation with voluntary guarding.  No rebound.  No masses palpated.  At his neurological baseline.    Reviewed and confirmed nursing documentation for past medical history, family history, social history.    Initial Assessment:   With the patient's presentation of abdominal pain, differential diagnosis includes but is not limited to AAA, mesenteric ischemia, appendicitis, diverticulitis, DKA, gastritis, gastroenteritis, AMI, nephrolithiasis, pancreatitis, peritonitis, adrenal insufficiency, intestinal ischemia,  constipation, UTI, SBO/LBO, splenic rupture, biliary disease, IBD, IBS, PUD, hepatitis, STD, ovarian/testicular torsion, electrolyte disturbance, DKA, dehydration, acute kidney injury, renal failure, cholecystitis, cholelithiasis, choledocholithiasis, abdominal pain of  unknown etiology.   Initial Plan:  Screening labs including CBC and Metabolic panel to evaluate for infectious or metabolic etiology of disease.  Lipase to evaluate for pancreatitis Urinalysis with reflex culture ordered to evaluate for UTI or relevant urologic/nephrologic pathology.  CT abd/pelvis to evaluate for intra-abdominal pathology Symptomatic management Objective evaluation as reviewed   Initial Study Results:   Laboratory  All laboratory results reviewed without evidence of clinically relevant pathology.    Radiology:  All images reviewed independently. Agree with radiology report at this time.   CT ABDOMEN PELVIS WO CONTRAST  Result Date: 09/01/2022 CLINICAL DATA:  37 year old male with history of left-sided flank pain. EXAM: CT ABDOMEN AND PELVIS WITHOUT CONTRAST TECHNIQUE: Multidetector CT imaging of the abdomen and pelvis was performed following the standard protocol without IV contrast. RADIATION DOSE REDUCTION: This exam was performed according to the departmental dose-optimization program which includes automated exposure control, adjustment of the mA and/or kV according to patient size and/or use of iterative reconstruction technique. COMPARISON:  No priors. FINDINGS: Lower chest: Unremarkable. Hepatobiliary: No suspicious cystic or solid hepatic lesions. No intra or extrahepatic biliary ductal dilatation. Gallbladder is unremarkable in appearance. Pancreas: No pancreatic mass. No pancreatic ductal dilatation. No pancreatic or peripancreatic fluid collections or inflammatory changes. Spleen: Unremarkable. Adrenals/Urinary Tract: No calcifications are identified within the collecting system of either kidney, along  the course of either ureter, or within the lumen of the urinary bladder. No hydroureteronephrosis. Urinary bladder is unremarkable in appearance. Stomach/Bowel: The appearance of the stomach is normal. There is no pathologic dilatation of small bowel or colon. Normal appendix. Vascular/Lymphatic: No atherosclerotic calcifications are noted in the thoracic aorta or the coronary arteries. No lymphadenopathy noted in the abdomen or pelvis. Reproductive: Prostate gland and seminal vesicles are unremarkable in appearance. Other: No significant volume of ascites.  No pneumoperitoneum. Musculoskeletal: There are no aggressive appearing lytic or blastic lesions noted in the visualized portions of the skeleton. IMPRESSION: 1. No acute findings are noted in the abdomen or pelvis to account for the patient's symptoms. Specifically, no urinary tract calculi no findings of urinary tract obstruction. Electronically Signed   By: Trudie Reed M.D.   On: 09/01/2022 12:53   Final Assessment and Plan:   This is a 37 year old male who presents to the ED complaining of left flank pain.  Of note, patient evaluated for symptoms recently by PCP and  states that he was concerned for kidney stones.  Patient without history of kidney stones.  He does have left lower quadrant and left CVA tenderness.  Abdomen soft and nondistended.  Vital signs unremarkable.  Patient neurologically intact.  Workup initiated as above for further assessment.  Patient denies previous imaging of his abdomen for his symptoms.  He does note that he has had recurrent abdominal pain for some time now.  UA negative.  Blood work normal.  CT without ureterolithiasis or other acute findings.  No evidence of emergent process on workup today.  Discussed all findings with patient.  With his history of recurrent abdominal pain, recommended that he follow-up with GI and his PCP outpatient.  Patient agreeable to do so.  Tolerating p.o. intake with ease in the ED.  Strict  ED return precautions given, all questions answered, and stable for discharge.   Clinical Impression:  1. Left flank pain      Discharge           Final Clinical Impression(s) / ED Diagnoses Final diagnoses:  Left flank pain    Rx / DC Orders ED Discharge Orders          Ordered    dicyclomine (BENTYL) 20 MG tablet  2 times daily        09/01/22 1402    ondansetron (ZOFRAN) 4 MG tablet  Every 8 hours PRN        09/01/22 1402              Tonette Lederer, PA-C 09/01/22 1408    Bethann Berkshire, MD 09/01/22 [phone]

## 2022-09-02 NOTE — Progress Notes (Signed)
Explain to mom.  Great news- urinalysis shows no more kidney stones and the blood is gone. Just keep drinking plenty of fluid and avoid oxalate rich foods.  Yeast is gone too now so no need to treat.

## 2022-09-03 NOTE — Progress Notes (Signed)
Please notify the patient and answer any questions they have:   Results of Hand X-ray: We have reviewed your hand X-ray and are pleased to inform you that there are no immediate concerns evident from the imaging.  Referral to Specialist: To ensure comprehensive care, we are referring you to a hand surgeon for further evaluation. The specialist may consider more advanced imaging techniques to ascertain the precise nature of your condition.  Monitoring Symptoms: Should you experience any escalation in pain or discomfort, it is important to seek medical attention promptly. In such cases, visiting the Emergency Room (ER) may be necessary.  Follow-Up: Our office will coordinate with the hand surgeon's team to facilitate your upcoming appointment. Please feel free to reach out to [place] if you have any questions or require assistance in the interim.  Warm regards

## 2022-09-12 ENCOUNTER — Other Ambulatory Visit (HOSPITAL_BASED_OUTPATIENT_CLINIC_OR_DEPARTMENT_OTHER): Payer: Self-pay

## 2022-09-12 ENCOUNTER — Ambulatory Visit (INDEPENDENT_AMBULATORY_CARE_PROVIDER_SITE_OTHER): Payer: Medicaid Other | Admitting: Orthopaedic Surgery

## 2022-09-12 ENCOUNTER — Ambulatory Visit (HOSPITAL_BASED_OUTPATIENT_CLINIC_OR_DEPARTMENT_OTHER): Payer: Self-pay | Admitting: Orthopaedic Surgery

## 2022-09-12 DIAGNOSIS — T85848A Pain due to other internal prosthetic devices, implants and grafts, initial encounter: Secondary | ICD-10-CM

## 2022-09-12 MED ORDER — ACETAMINOPHEN 500 MG PO TABS
500.0000 mg | ORAL_TABLET | Freq: Three times a day (TID) | ORAL | 0 refills | Status: AC
  Filled 2022-09-12: qty 30, 10d supply, fill #0

## 2022-09-12 MED ORDER — IBUPROFEN 800 MG PO TABS
800.0000 mg | ORAL_TABLET | Freq: Three times a day (TID) | ORAL | 0 refills | Status: AC
  Filled 2022-09-12: qty 30, 10d supply, fill #0

## 2022-09-12 NOTE — Progress Notes (Signed)
Chief Complaint: Right hand small finger pain     History of Present Illness:    Martin Cooper is a 37 y.o. male presents today with right small finger pain and tenderness over the extensor tendon of the right hand.  He is status post right fifth metacarpal open reduction internal fixation 2010.  Since that time he has had some irritation and swelling about the plate.  He does feel this and feels like it was prominent.  He is hoping to have this removed.  He is here today with his mom who he lives with.    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       Asthma    Basic learning disability    Hand pain 08/31/2022   Hurting a lot lately - hit a porch with it and broke bone there years ago and had pin put in [facility] about 2020   Intermittent gross hematuria 06/25/2022   Intermittent hematuria/pelvic pain/dysuria ever since traumatic kick to groin around 2021   Seizures (HCC)    Yeast UTI 08/31/2022   Past Surgical History:  Procedure Laterality Date   HAND SURGERY Right 2020   Social History   Socioeconomic History   Marital status: Single    Spouse name: Not on file   Number of children: Not on file   Years of education: Not on file   Highest education level: Not on file  Occupational History   Not on file  Tobacco Use   Smoking status: Every Day    Packs/day: .5    Types: Cigarettes   Smokeless tobacco: Never   Tobacco comments:    2 daily  Vaping Use   Vaping Use: Never used  Substance and Sexual Activity   Alcohol use: Not Currently   Drug use: Yes    Types: Cocaine, Marijuana   Sexual activity: Not Currently  Other Topics Concern   Not on file  Social History Narrative   Not on file   Social Determinants of Health   Financial Resource Strain: Not on file  Food  Insecurity: Not on file  Transportation Needs: Not on file  Physical Activity: Not on file  Stress: Not on file  Social Connections: Not on file   Family History  Problem Relation Age of Onset   Hypertension Mother    Kidney disease Father    No Known Allergies Current Outpatient Medications  Medication Sig Dispense Refill   acetaminophen (TYLENOL) 500 MG tablet Take 1 tablet (500 mg total) by mouth every 8 (eight) hours for 10 days. 30 tablet 0   ibuprofen (ADVIL) 800 MG tablet Take 1 tablet (800 mg total) by mouth every 8 (eight) hours for 10 days. Please take with food, please alternate with acetaminophen 30 tablet 0   amoxicillin-clavulanate (AUGMENTIN) 875-125 MG tablet Take 1 tablet by mouth 2 (two) times daily. (Patient not taking: Reported on 09/01/2022) 20 tablet 0   dicyclomine (BENTYL) 20 MG tablet Take 1 tablet (20 mg total) by mouth 2 (two) times daily for 5 days. 10 tablet 0   ketorolac (TORADOL) 10 MG tablet Take 1 tablet (10 mg total) by mouth every  6 (six) hours as needed. (Patient not taking: Reported on 09/01/2022) 20 tablet 0   levETIRAcetam (KEPPRA) 1000 MG tablet Take 1 tablet (1,000 mg total) by mouth 2 (two) times daily. 60 tablet 11   phenytoin (DILANTIN) 100 MG ER capsule Take 3 capsules (300 mg total) by mouth at bedtime. 90 capsule 11   Saline (SIMPLY SALINE) 0.9 % AERS Place 2 each into the nose daily as needed. (Patient not taking: Reported on 09/01/2022) 500 mL 1   No current facility-administered medications for this visit.   No results found.  Review of Systems:   A ROS was performed including pertinent positives and negatives as documented in the HPI.  Physical Exam :   Constitutional: NAD and appears stated age Neurological: Alert and oriented Psych: Appropriate affect and cooperative There were no vitals taken for this visit.   Comprehensive Musculoskeletal Exam:    There is tenderness palpation about the right fifth metacarpal with some  bogginess about the plate.  He is able to fully extend at the left small finger.  Sensation is intact.  Previous incision is well-healed.  He is able to make a full composite fist.  There is no extension lag about the right hand or small finger  Imaging:   Xray (3 views right hand): Status post fifth metacarpal open reduction internal fixation without evidence of complication    I personally reviewed and interpreted the radiographs.   Assessment:   37 y.o. male with a healed right fifth metacarpal fracture status post open duction internal fixation.  At today's visit he does continue to feel his plate and is bothered by this.  This is causing some component of extensor tenosynovitis.  Given this I would like to proceed with removal of fifth metacarpal hardware.  Will plan to proceed with this.  Plan :    -Plan for right hand removal of hardware fifth metacarpal   After a lengthy discussion of treatment options, including risks, benefits, alternatives, complications of surgical and nonsurgical conservative options, the patient elected surgical repair.   The patient  is aware of the material risks  and complications including, but not limited to injury to adjacent structures, neurovascular injury, infection, numbness, bleeding, implant failure, thermal burns, stiffness, persistent pain, failure to heal, disease transmission from allograft, need for further surgery, dislocation, anesthetic risks, blood clots, risks of death,and others. The probabilities of surgical success and failure discussed with patient given their particular co-morbidities.The time and nature of expected rehabilitation and recovery was discussed.The patient's questions were all answered preoperatively.  No barriers to understanding were noted. I explained the natural history of the disease process and Rx rationale.  I explained to the patient what I considered to be reasonable expectations given their personal situation.  The  final treatment plan was arrived at through a shared patient decision making process model.      I personally saw and evaluated the patient, and participated in the management and treatment plan.  [name], MD Attending Physician, Orthopedic Surgery  This document was dictated using Dragon voice recognition software. A reasonable attempt at proof reading has been made to minimize errors.

## 2022-09-12 NOTE — H&P (View-Only) (Signed)
                               Chief Complaint: Right hand small finger pain     History of Present Illness:    Martin Cooper is a 37 y.o. male presents today with right small finger pain and tenderness over the extensor tendon of the right hand.  He is status post right fifth metacarpal open reduction internal fixation 2010.  Since that time he has had some irritation and swelling about the plate.  He does feel this and feels like it was prominent.  He is hoping to have this removed.  He is here today with his mom who he lives with.    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       Asthma    Basic learning disability    Hand pain 08/31/2022   Hurting a lot lately - hit a porch with it and broke bone there years ago and had pin put in [place] about 2020   Intermittent gross hematuria 06/25/2022   Intermittent hematuria/pelvic pain/dysuria ever since traumatic kick to groin around 2021   Seizures (HCC)    Yeast UTI 08/31/2022   Past Surgical History:  Procedure Laterality Date   HAND SURGERY Right 2020   Social History   Socioeconomic History   Marital status: Single    Spouse name: Not on file   Number of children: Not on file   Years of education: Not on file   Highest education level: Not on file  Occupational History   Not on file  Tobacco Use   Smoking status: Every Day    Packs/day: .5    Types: Cigarettes   Smokeless tobacco: Never   Tobacco comments:    2 daily  Vaping Use   Vaping Use: Never used  Substance and Sexual Activity   Alcohol use: Not Currently   Drug use: Yes    Types: Cocaine, Marijuana   Sexual activity: Not Currently  Other Topics Concern   Not on file  Social History Narrative   Not on file   Social Determinants of Health   Financial Resource Strain: Not on file  Food  Insecurity: Not on file  Transportation Needs: Not on file  Physical Activity: Not on file  Stress: Not on file  Social Connections: Not on file   Family History  Problem Relation Age of Onset   Hypertension Mother    Kidney disease Father    No Known Allergies Current Outpatient Medications  Medication Sig Dispense Refill   acetaminophen (TYLENOL) 500 MG tablet Take 1 tablet (500 mg total) by mouth every 8 (eight) hours for 10 days. 30 tablet 0   ibuprofen (ADVIL) 800 MG tablet Take 1 tablet (800 mg total) by mouth every 8 (eight) hours for 10 days. Please take with food, please alternate with acetaminophen 30 tablet 0   amoxicillin-clavulanate (AUGMENTIN) 875-125 MG tablet Take 1 tablet by mouth 2 (two) times daily. (Patient not taking: Reported on 09/01/2022) 20 tablet 0   dicyclomine (BENTYL) 20 MG tablet Take 1 tablet (20 mg total) by mouth 2 (two) times daily for 5 days. 10 tablet 0   ketorolac (TORADOL) 10 MG tablet Take 1 tablet (10 mg total) by mouth every   6 (six) hours as needed. (Patient not taking: Reported on 09/01/2022) 20 tablet 0   levETIRAcetam (KEPPRA) 1000 MG tablet Take 1 tablet (1,000 mg total) by mouth 2 (two) times daily. 60 tablet 11   phenytoin (DILANTIN) 100 MG ER capsule Take 3 capsules (300 mg total) by mouth at bedtime. 90 capsule 11   Saline (SIMPLY SALINE) 0.9 % AERS Place 2 each into the nose daily as needed. (Patient not taking: Reported on 09/01/2022) 500 mL 1   No current facility-administered medications for this visit.   No results found.  Review of Systems:   A ROS was performed including pertinent positives and negatives as documented in the HPI.  Physical Exam :   Constitutional: NAD and appears stated age Neurological: Alert and oriented Psych: Appropriate affect and cooperative There were no vitals taken for this visit.   Comprehensive Musculoskeletal Exam:    There is tenderness palpation about the right fifth metacarpal with some  bogginess about the plate.  He is able to fully extend at the left small finger.  Sensation is intact.  Previous incision is well-healed.  He is able to make a full composite fist.  There is no extension lag about the right hand or small finger  Imaging:   Xray (3 views right hand): Status post fifth metacarpal open reduction internal fixation without evidence of complication    I personally reviewed and interpreted the radiographs.   Assessment:   37 y.o. male with a healed right fifth metacarpal fracture status post open duction internal fixation.  At today's visit he does continue to feel his plate and is bothered by this.  This is causing some component of extensor tenosynovitis.  Given this I would like to proceed with removal of fifth metacarpal hardware.  Will plan to proceed with this.  Plan :    -Plan for right hand removal of hardware fifth metacarpal   After a lengthy discussion of treatment options, including risks, benefits, alternatives, complications of surgical and nonsurgical conservative options, the patient elected surgical repair.   The patient  is aware of the material risks  and complications including, but not limited to injury to adjacent structures, neurovascular injury, infection, numbness, bleeding, implant failure, thermal burns, stiffness, persistent pain, failure to heal, disease transmission from allograft, need for further surgery, dislocation, anesthetic risks, blood clots, risks of death,and others. The probabilities of surgical success and failure discussed with patient given their particular co-morbidities.The time and nature of expected rehabilitation and recovery was discussed.The patient's questions were all answered preoperatively.  No barriers to understanding were noted. I explained the natural history of the disease process and Rx rationale.  I explained to the patient what I considered to be reasonable expectations given their personal situation.  The  final treatment plan was arrived at through a shared patient decision making process model.      I personally saw and evaluated the patient, and participated in the management and treatment plan.  Ziair Penson, MD Attending Physician, Orthopedic Surgery  This document was dictated using Dragon voice recognition software. A reasonable attempt at proof reading has been made to minimize errors. 

## 2022-09-20 ENCOUNTER — Encounter (HOSPITAL_COMMUNITY): Payer: Self-pay | Admitting: Orthopaedic Surgery

## 2022-09-20 ENCOUNTER — Other Ambulatory Visit: Payer: Self-pay

## 2022-09-20 NOTE — Progress Notes (Signed)
Mr Martin Cooper denies chest pain or shortness of breath. Patient denies having any s/s of Covid in his household, also denies any known exposure to Covid.  [name] denies  any s/s of upper or lower respiratory in the past 8 weeks.  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 Mr. Martin Cooper PCP is Dr. Glenetta Hew, neurologist is Dr. Debera Lat. 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  Patient has had several ED visits for seizures. Mr. Martin Cooper reports that last seizure was last week, patient states that seizures usually happen when he is upset. Dr. Antony Cooper noted that patient before a seizure, he becomes dizzy headed, taste blood, fells nauseated and right leg goes numb.  Patient said that last seizure was last week.   Mr. Martin Cooper took notes and did teach back with instructions.

## 2022-09-21 ENCOUNTER — Other Ambulatory Visit (HOSPITAL_BASED_OUTPATIENT_CLINIC_OR_DEPARTMENT_OTHER): Payer: Self-pay

## 2022-09-24 ENCOUNTER — Other Ambulatory Visit: Payer: Self-pay

## 2022-09-24 ENCOUNTER — Encounter (HOSPITAL_COMMUNITY): Payer: Self-pay | Admitting: Orthopaedic Surgery

## 2022-09-24 ENCOUNTER — Ambulatory Visit (HOSPITAL_BASED_OUTPATIENT_CLINIC_OR_DEPARTMENT_OTHER): Payer: Medicaid Other

## 2022-09-24 ENCOUNTER — Ambulatory Visit (HOSPITAL_COMMUNITY): Payer: Medicaid Other

## 2022-09-24 ENCOUNTER — Encounter (HOSPITAL_COMMUNITY)
Admission: RE | Disposition: A | Discharge status: Home / Self Care | Payer: Self-pay | Source: Home / Self Care | Attending: Orthopaedic Surgery

## 2022-09-24 ENCOUNTER — Ambulatory Visit (HOSPITAL_COMMUNITY)
Admission: RE | Admit: 2022-09-24 | Discharge: 2022-09-24 | Disposition: A | Discharge status: Home / Self Care | Payer: Medicaid Other | Attending: Orthopaedic Surgery | Admitting: Orthopaedic Surgery

## 2022-09-24 DIAGNOSIS — T85848A Pain due to other internal prosthetic devices, implants and grafts, initial encounter: Secondary | ICD-10-CM

## 2022-09-24 DIAGNOSIS — T8484XA Pain due to internal orthopedic prosthetic devices, implants and grafts, initial encounter: Secondary | ICD-10-CM | POA: Diagnosis present

## 2022-09-24 DIAGNOSIS — Z8781 Personal history of (healed) traumatic fracture: Secondary | ICD-10-CM | POA: Diagnosis not present

## 2022-09-24 DIAGNOSIS — J45909 Unspecified asthma, uncomplicated: Secondary | ICD-10-CM | POA: Diagnosis not present

## 2022-09-24 DIAGNOSIS — Y831 Surgical operation with implant of artificial internal device as the cause of abnormal reaction of the patient, or of later complication, without mention of misadventure at the time of the procedure: Secondary | ICD-10-CM | POA: Diagnosis not present

## 2022-09-24 DIAGNOSIS — F17210 Nicotine dependence, cigarettes, uncomplicated: Secondary | ICD-10-CM | POA: Insufficient documentation

## 2022-09-24 DIAGNOSIS — R569 Unspecified convulsions: Secondary | ICD-10-CM | POA: Insufficient documentation

## 2022-09-24 HISTORY — PX: HARDWARE REMOVAL: SHX979

## 2022-09-24 SURGERY — REMOVAL, HARDWARE
Anesthesia: General | Site: Hand | Laterality: Right

## 2022-09-24 MED ORDER — CHLORHEXIDINE GLUCONATE 0.12 % MT SOLN: 15.0000 mL | Freq: Once | OROMUCOSAL | Status: AC

## 2022-09-24 MED ORDER — MIDAZOLAM HCL 2 MG/2ML IJ SOLN
INTRAMUSCULAR | Status: AC
  Filled 2022-09-24: qty 2

## 2022-09-24 MED ORDER — ORAL CARE MOUTH RINSE: 15.0000 mL | Freq: Once | OROMUCOSAL | Status: AC

## 2022-09-24 MED ORDER — TRANEXAMIC ACID-NACL 1000-0.7 MG/100ML-% IV SOLN
1000.0000 mg | INTRAVENOUS | Status: DC
  Filled 2022-09-24: qty 100

## 2022-09-24 MED ORDER — OXYCODONE HCL 5 MG PO TABS: 5.0000 mg | ORAL_TABLET | Freq: Once | ORAL | Status: DC | PRN

## 2022-09-24 MED ORDER — PROMETHAZINE HCL 25 MG/ML IJ SOLN: 6.2500 mg | INTRAMUSCULAR | Status: DC | PRN

## 2022-09-24 MED ORDER — OXYCODONE HCL 5 MG/5ML PO SOLN: 5.0000 mg | Freq: Once | ORAL | Status: DC | PRN

## 2022-09-24 MED ORDER — BUPIVACAINE HCL 0.25 % IJ SOLN
INTRAMUSCULAR | Status: DC | PRN
  Administered 2022-09-24: 8 mL

## 2022-09-24 MED ORDER — DEXAMETHASONE SODIUM PHOSPHATE 10 MG/ML IJ SOLN
INTRAMUSCULAR | Status: DC | PRN
  Administered 2022-09-24: 4 mg via INTRAVENOUS

## 2022-09-24 MED ORDER — BUPIVACAINE HCL (PF) 0.25 % IJ SOLN
INTRAMUSCULAR | Status: AC
  Filled 2022-09-24: qty 10

## 2022-09-24 MED ORDER — FENTANYL CITRATE (PF) 250 MCG/5ML IJ SOLN
INTRAMUSCULAR | Status: AC
  Filled 2022-09-24: qty 5

## 2022-09-24 MED ORDER — GABAPENTIN 300 MG PO CAPS
300.0000 mg | ORAL_CAPSULE | Freq: Once | ORAL | Status: AC
  Administered 2022-09-24: 300 mg via ORAL
  Filled 2022-09-24: qty 1

## 2022-09-24 MED ORDER — LACTATED RINGERS IV SOLN: INTRAVENOUS | Status: DC

## 2022-09-24 MED ORDER — CEFAZOLIN SODIUM-DEXTROSE 2-4 GM/100ML-% IV SOLN
2.0000 g | INTRAVENOUS | Status: AC
  Administered 2022-09-24: 2 g via INTRAVENOUS
  Filled 2022-09-24: qty 100

## 2022-09-24 MED ORDER — ONDANSETRON HCL 4 MG/2ML IJ SOLN
INTRAMUSCULAR | Status: DC | PRN
  Administered 2022-09-24: 4 mg via INTRAVENOUS

## 2022-09-24 MED ORDER — LIDOCAINE 2% (20 MG/ML) 5 ML SYRINGE
INTRAMUSCULAR | Status: DC | PRN
  Administered 2022-09-24: 50 mg via INTRAVENOUS

## 2022-09-24 MED ORDER — CHLORHEXIDINE GLUCONATE 0.12 % MT SOLN
OROMUCOSAL | Status: AC
  Administered 2022-09-24: 15 mL via OROMUCOSAL
  Filled 2022-09-24: qty 15

## 2022-09-24 MED ORDER — PROPOFOL 10 MG/ML IV BOLUS
INTRAVENOUS | Status: DC | PRN
  Administered 2022-09-24: 200 mg via INTRAVENOUS

## 2022-09-24 MED ORDER — AMISULPRIDE (ANTIEMETIC) 5 MG/2ML IV SOLN: 10.0000 mg | Freq: Once | INTRAVENOUS | Status: DC | PRN

## 2022-09-24 MED ORDER — MEPERIDINE HCL 25 MG/ML IJ SOLN: 6.2500 mg | INTRAMUSCULAR | Status: DC | PRN

## 2022-09-24 MED ORDER — FENTANYL CITRATE (PF) 250 MCG/5ML IJ SOLN
INTRAMUSCULAR | Status: DC | PRN
  Administered 2022-09-24: 50 ug via INTRAVENOUS

## 2022-09-24 MED ORDER — HYDROMORPHONE HCL 1 MG/ML IJ SOLN: 0.2500 mg | INTRAMUSCULAR | Status: DC | PRN

## 2022-09-24 MED ORDER — 0.9 % SODIUM CHLORIDE (POUR BTL) OPTIME
TOPICAL | Status: DC | PRN
  Administered 2022-09-24: 1000 mL

## 2022-09-24 MED ORDER — LIDOCAINE 2% (20 MG/ML) 5 ML SYRINGE
INTRAMUSCULAR | Status: AC
  Filled 2022-09-24: qty 5

## 2022-09-24 MED ORDER — ROCURONIUM BROMIDE 10 MG/ML (PF) SYRINGE
PREFILLED_SYRINGE | INTRAVENOUS | Status: AC
  Filled 2022-09-24: qty 10

## 2022-09-24 MED ORDER — ACETAMINOPHEN 500 MG PO TABS
1000.0000 mg | ORAL_TABLET | Freq: Once | ORAL | Status: AC
  Administered 2022-09-24: 1000 mg via ORAL
  Filled 2022-09-24: qty 2

## 2022-09-24 MED ORDER — MIDAZOLAM HCL 2 MG/2ML IJ SOLN
INTRAMUSCULAR | Status: DC | PRN
  Administered 2022-09-24: 2 mg via INTRAVENOUS

## 2022-09-24 MED ORDER — SUCCINYLCHOLINE CHLORIDE 200 MG/10ML IV SOSY
PREFILLED_SYRINGE | INTRAVENOUS | Status: AC
  Filled 2022-09-24: qty 10

## 2022-09-24 MED ORDER — PHENYLEPHRINE 80 MCG/ML (10ML) SYRINGE FOR IV PUSH (FOR BLOOD PRESSURE SUPPORT)
PREFILLED_SYRINGE | INTRAVENOUS | Status: DC | PRN
  Administered 2022-09-24 (×2): 80 ug via INTRAVENOUS
  Administered 2022-09-24 (×2): 40 ug via INTRAVENOUS

## 2022-09-24 SURGICAL SUPPLY — 47 items
BAG COUNTER SPONGE SURGICOUNT (BAG) IMPLANT
BAG SPNG CNTER NS LX DISP (BAG) ×1
BLADE SURG 21 STRL SS (BLADE) ×1 IMPLANT
BNDG CMPR 5X3 KNIT ELC UNQ LF (GAUZE/BANDAGES/DRESSINGS) ×1
BNDG CMPR 5X6 CHSV STRCH STRL (GAUZE/BANDAGES/DRESSINGS)
BNDG COHESIVE 6X5 TAN ST LF (GAUZE/BANDAGES/DRESSINGS) IMPLANT
BNDG ELASTIC 3INX 5YD STR LF (GAUZE/BANDAGES/DRESSINGS) IMPLANT
BNDG GAUZE DERMACEA FLUFF 4 (GAUZE/BANDAGES/DRESSINGS) ×2 IMPLANT
BNDG GZE DERMACEA 4 6PLY (GAUZE/BANDAGES/DRESSINGS)
COVER SURGICAL LIGHT HANDLE (MISCELLANEOUS) ×2 IMPLANT
DRAPE U-SHAPE 47X51 STRL (DRAPES) ×1 IMPLANT
DRSG ADAPTIC 3X8 NADH LF (GAUZE/BANDAGES/DRESSINGS) ×1 IMPLANT
DRSG EMULSION OIL 3X3 NADH (GAUZE/BANDAGES/DRESSINGS) IMPLANT
DURAPREP 26ML APPLICATOR (WOUND CARE) ×1 IMPLANT
ELECT REM PT RETURN 9FT ADLT (ELECTROSURGICAL) ×1
ELECTRODE REM PT RTRN 9FT ADLT (ELECTROSURGICAL) IMPLANT
GAUZE SPONGE 4X4 12PLY STRL (GAUZE/BANDAGES/DRESSINGS) ×1 IMPLANT
GLOVE BIO SURGEON STRL SZ 6 (GLOVE) IMPLANT
GLOVE BIO SURGEON STRL SZ7.5 (GLOVE) IMPLANT
GLOVE BIOGEL PI IND STRL 6.5 (GLOVE) IMPLANT
GLOVE BIOGEL PI IND STRL 8 (GLOVE) IMPLANT
GLOVE BIOGEL PI IND STRL 9 (GLOVE) ×1 IMPLANT
GLOVE SURG ORTHO 9.0 STRL STRW (GLOVE) ×1 IMPLANT
GOWN STRL REUS W/ TWL LRG LVL3 (GOWN DISPOSABLE) IMPLANT
GOWN STRL REUS W/ TWL XL LVL3 (GOWN DISPOSABLE) ×2 IMPLANT
GOWN STRL REUS W/TWL LRG LVL3 (GOWN DISPOSABLE) ×3
GOWN STRL REUS W/TWL XL LVL3 (GOWN DISPOSABLE) ×1
HANDPIECE INTERPULSE COAX TIP (DISPOSABLE)
KIT BASIN OR (CUSTOM PROCEDURE TRAY) ×1 IMPLANT
KIT TURNOVER KIT B (KITS) ×1 IMPLANT
MANIFOLD NEPTUNE II (INSTRUMENTS) ×1 IMPLANT
NDL HYPO 25GX1X1/2 BEV (NEEDLE) IMPLANT
NEEDLE HYPO 25GX1X1/2 BEV (NEEDLE) ×1 IMPLANT
NS IRRIG 1000ML POUR BTL (IV SOLUTION) ×1 IMPLANT
PACK ORTHO EXTREMITY (CUSTOM PROCEDURE TRAY) ×1 IMPLANT
PAD ARMBOARD 7.5X6 YLW CONV (MISCELLANEOUS) ×2 IMPLANT
SET HNDPC FAN SPRY TIP SCT (DISPOSABLE) IMPLANT
STOCKINETTE IMPERVIOUS 9X36 MD (GAUZE/BANDAGES/DRESSINGS) IMPLANT
SUCTION TUBE FRAZIER 10FR DISP (SUCTIONS) IMPLANT
SUT ETHILON 2 0 PSLX (SUTURE) ×1 IMPLANT
SUT ETHILON 3 0 PS 1 (SUTURE) IMPLANT
SWAB COLLECTION DEVICE MRSA (MISCELLANEOUS) ×1 IMPLANT
SWAB CULTURE ESWAB REG 1ML (MISCELLANEOUS) IMPLANT
SYR CONTROL 10ML LL (SYRINGE) IMPLANT
TOWEL GREEN STERILE (TOWEL DISPOSABLE) ×1 IMPLANT
TUBE CONNECTING 12X1/4 (SUCTIONS) ×1 IMPLANT
YANKAUER SUCT BULB TIP NO VENT (SUCTIONS) ×1 IMPLANT

## 2022-09-24 NOTE — Transfer of Care (Signed)
Immediate Anesthesia Transfer of Care Note  Patient: Martin Cooper  Procedure(s) Performed: RIGHT HAND HARDWARE REMOVAL (Right: Hand)  Patient Location: PACU  Anesthesia Type:General  Level of Consciousness: awake  Airway & Oxygen Therapy: Patient Spontanous Breathing and Patient connected to face mask oxygen  Post-op Assessment: Report given to RN and Post -op Vital signs reviewed and stable  Post vital signs: Reviewed and stable  Last Vitals:  Vitals Value Taken Time  BP 137/88 09/24/22 1413  Temp 36.6 C 09/24/22 1413  Pulse 59 09/24/22 1415  Resp 18 09/24/22 1415  SpO2 100 % 09/24/22 1415  Vitals shown include unvalidated device data.  Last Pain:  Vitals:   09/24/22 1248  TempSrc:   PainSc: 10-Worst pain ever      Patients Stated Pain Goal: 2 (09/24/22 1248)  Complications: No notable events documented.

## 2022-09-24 NOTE — Brief Op Note (Signed)
   Brief Op Note  Date of Surgery: 09/24/2022  Preoperative Diagnosis: RIGHT 5TH METACARPAL SYMPTOMATIC HARDWARE  Postoperative Diagnosis: same  Procedure: Procedure(s): RIGHT HAND HARDWARE REMOVAL  Implants: * No implants in log *  Surgeons: Surgeon(s): [name], MD  Anesthesia: General    Estimated Blood Loss: See anesthesia record  Complications: None  Condition to PACU: Stable  Benancio Deeds, MD 09/24/2022 2:13 PM

## 2022-09-24 NOTE — Anesthesia Procedure Notes (Signed)
Procedure Name: LMA Insertion Date/Time: 09/24/2022 1:25 PM  Performed by: Lowella Curb, MDPre-anesthesia Checklist: Patient identified, Emergency Drugs available, Suction available, Patient being monitored and Timeout performed Patient Re-evaluated:Patient Re-evaluated prior to induction Oxygen Delivery Method: Circle system utilized Preoxygenation: Pre-oxygenation with 100% oxygen Induction Type: IV induction and Combination inhalational/ intravenous induction Ventilation: Mask ventilation with difficulty LMA: LMA inserted LMA Size: 4.0 Number of attempts: 1 Placement Confirmation: positive ETCO2 Dental Injury: Teeth and Oropharynx as per pre-operative assessment

## 2022-09-24 NOTE — Anesthesia Postprocedure Evaluation (Signed)
Anesthesia Post Note  Patient: Martin Cooper  Procedure(s) Performed: RIGHT HAND HARDWARE REMOVAL (Right: Hand)     Patient location during evaluation: PACU Anesthesia Type: General Level of consciousness: awake and alert Pain management: pain level controlled Vital Signs Assessment: post-procedure vital signs reviewed and stable Respiratory status: spontaneous breathing, nonlabored ventilation and respiratory function stable Cardiovascular status: blood pressure returned to baseline and stable Postop Assessment: no apparent nausea or vomiting Anesthetic complications: no   No notable events documented.  Last Vitals:  Vitals:   09/24/22 1430 09/24/22 1445  BP: 111/87 114/84  Pulse: (!) 48 (!) 51  Resp: 17 18  Temp:  36.6 C  SpO2: 100% 100%    Last Pain:  Vitals:   09/24/22 1445  TempSrc:   PainSc: 0-No pain   Pain Goal: Patients Stated Pain Goal: 2 (09/24/22 1248)                 Lowella Curb

## 2022-09-24 NOTE — Op Note (Signed)
   Date of Surgery: 09/24/2022  INDICATIONS: Mr. Schonberg is a 37 y.o.-year-old male with a right fifth metacarpal plate which was bothersome and causing extensor tendon irritation.  The risk and benefits of the procedure were discussed in detail and documented in the pre-operative evaluation.   PREOPERATIVE DIAGNOSIS: 1.  Right fifth metacarpal painful hardware  POSTOPERATIVE DIAGNOSIS: Same.  PROCEDURE: 1.  Right deep removal of hardware fifth metacarpal  SURGEON: Benancio Deeds MD  ASSISTANT: Kerby Less, ATC  ANESTHESIA:  general +10 cc 0.25% Marcaine  IV FLUIDS AND URINE: See anesthesia record.  ANTIBIOTICS: Ancef  ESTIMATED BLOOD LOSS: 5 mL.  IMPLANTS:  * No implants in log *  DRAINS: None  CULTURES: None  COMPLICATIONS: none  DESCRIPTION OF PROCEDURE:   Patient was identified in the preoperative holding area.  The correct site was marked according universal protocol nursing.  He was subsequently taken back to the operating room.  Anesthesia was induced.  He was placed in spine position with arm on an armboard with all bony prominences padded.  Final timeout was performed.  Antibiotics were given 1 hour prior to skin incision.  15 blade was used to incise dorsally over the previous incision over the dorsal fifth metacarpal.  This was taken to the level of dermis.  Metzenbaum scissors were used to spread over the extensor tendon.  This was significantly scarred down to the plate.  This was mobilized and retracted.  The plate was exposed.  Ronguer was used to remove any tissue overlying from the plate.  At this time screwdriver was used to remove all six screws.  There is 1 screw that had broken as result the decision was made not to core this out due to the small diameter of the metacarpal.  The plate was removed with a Glorious Peach.  The wound was thoroughly irrigated and closed with 3-0 nylon.    POSTOPERATIVE PLAN: He will be activity as tolerated on the right arm.  He will be  seen in 2 weeks for suture removal.  Benancio Deeds, MD 2:13 PM

## 2022-09-24 NOTE — Anesthesia Preprocedure Evaluation (Addendum)
Anesthesia Evaluation  Patient identified by MRN, date of birth, ID band Patient awake    Reviewed: Allergy & Precautions, H&P , NPO status , Patient's Chart, lab work & pertinent test results  Airway Mallampati: II  TM Distance: >3 FB Neck ROM: Full    Dental  (+) Chipped, Poor Dentition, Missing   Pulmonary asthma , Current Smoker and Patient abstained from smoking.   Pulmonary exam normal breath sounds clear to auscultation       Cardiovascular negative cardio ROS Normal cardiovascular exam Rhythm:Regular Rate:Normal     Neuro/Psych Seizures -,   negative psych ROS   GI/Hepatic Neg liver ROS,GERD  ,,  Endo/Other  negative endocrine ROS    Renal/GU negative Renal ROS  negative genitourinary   Musculoskeletal negative musculoskeletal ROS (+)    Abdominal   Peds negative pediatric ROS (+) mental retardation Hematology negative hematology ROS (+)   Anesthesia Other Findings   Reproductive/Obstetrics negative OB ROS                             Anesthesia Physical Anesthesia Plan  ASA: 3  Anesthesia Plan: General   Post-op Pain Management: Tylenol PO (pre-op)* and Gabapentin PO (pre-op)*   Induction: Intravenous  PONV Risk Score and Plan: 1 and Ondansetron and Treatment may vary due to age or medical condition  Airway Management Planned: LMA  Additional Equipment:   Intra-op Plan:   Post-operative Plan: Extubation in OR  Informed Consent: I have reviewed the patients History and Physical, chart, labs and discussed the procedure including the risks, benefits and alternatives for the proposed anesthesia with the patient or authorized representative who has indicated his/her understanding and acceptance.     Dental advisory given  Plan Discussed with: CRNA  Anesthesia Plan Comments:        Anesthesia Quick Evaluation

## 2022-09-24 NOTE — Brief Op Note (Signed)
   Brief Op Note  Date of Surgery: 09/24/2022  Preoperative Diagnosis: RIGHT 5TH METACARPAL SYMPTOMATIC HARDWARE  Postoperative Diagnosis: same  Procedure: Procedure(s): RIGHT HAND HARDWARE REMOVAL  Implants: * No implants in log *  Surgeons: Surgeon(s): [name], MD  Anesthesia: General    Estimated Blood Loss: See anesthesia record  Complications: None  Condition to PACU: Stable  Benancio Deeds, MD 09/24/2022 2:11 PM

## 2022-09-24 NOTE — Interval H&P Note (Signed)
History and Physical Interval Note:  09/24/2022 12:59 PM  Martin Cooper  has presented today for surgery, with the diagnosis of RIGHT 5TH METACARPAL SYMPTOMATIC HARDWARE.  The various methods of treatment have been discussed with the patient and family. After consideration of risks, benefits and other options for treatment, the patient has consented to  Procedure(s): RIGHT HAND HARDWARE REMOVAL (Right) as a surgical intervention.  The patient's history has been reviewed, patient examined, no change in status, stable for surgery.  I have reviewed the patient's chart and labs.  Questions were answered to the patient's satisfaction.     [name]

## 2022-09-24 NOTE — Discharge Instructions (Signed)
     Discharge Instructions    Attending Surgeon: [name], MD Office Phone Number: [phone]   Diagnosis and Procedures:    Surgeries Performed: Right hand removal of hardware  Discharge Plan:    Diet: Resume usual diet. Begin with light or bland foods.  Drink plenty of fluids.  Activity:  Keep sling and dressing in place until your follow up visit in Physical Therapy You are advised to go home directly from the hospital or surgical center. Restrict your activities.  GENERAL INSTRUCTIONS: 1.  Keep your surgical site elevated above your heart for at least 5-7 days or longer to prevent swelling. This will improve your comfort and your overall recovery following surgery.     2. Please call Dr. Serena Croissant office at [phone] with questions Monday-Friday during business hours. If no one answers, please leave a message and someone should get back to the patient within 24 hours. For emergencies please call 911 or proceed to the emergency room.   3. Patient to notify surgical team if experiences any of the following: Bowel/Bladder dysfunction, uncontrolled pain, nerve/muscle weakness, incision with increased drainage or redness, nausea/vomiting and Fever greater than 101.0 F.  Be alert for signs of infection including redness, streaking, odor, fever or chills. Be alert for excessive pain or bleeding and notify your surgeon immediately.  WOUND INSTRUCTIONS:   Leave your dressing/cast/splint in place until your post operative visit.  Keep it clean and dry.  Always keep the incision clean and dry until the staples/sutures are removed. If there is no drainage from the incision you should keep it open to air. If there is drainage from the incision you must keep it covered at all times until the drainage stops  Do not soak in a bath tub, hot tub, pool, lake or other body of water until 21 days after your surgery and your incision is completely dry and healed.  If you have  removable sutures (or staples) they must be removed 10-14 days (unless otherwise instructed) from the day of your surgery.     1)  Elevate the extremity as much as possible.  2)  Keep the dressing clean and dry.  3)  Please call us if the dressing becomes wet or dirty.  4)  If you are experiencing worsening pain or worsening swelling, please call.     MEDICATIONS: Resume all previous home medications at the previous prescribed dose and frequency unless otherwise noted Start taking the  pain medications on an as-needed basis as prescribed  Please taper down pain medication over the next week following surgery.  Ideally you should not require a refill of any narcotic pain medication.  Take pain medication with food to minimize nausea. In addition to the prescribed pain medication, you may take over-the-counter pain relievers such as Tylenol.  Do NOT take additional tylenol if your pain medication already has tylenol in it.  Aspirin 325mg  daily for four weeks.      FOLLOWUP INSTRUCTIONS: 1. Follow up at the Physical Therapy Clinic 3-4 days following surgery. This appointment should be scheduled unless other arrangements have been made.The Physical Therapy scheduling number is [phone] if an appointment has not already been arranged.  2. Contact Dr. Serena Croissant office during office hours at [phone] or the practice after hours line at [phone] for non-emergencies. For medical emergencies call 911.   Discharge Location: Home

## 2022-09-25 ENCOUNTER — Encounter (HOSPITAL_COMMUNITY): Payer: Self-pay | Admitting: Orthopaedic Surgery

## 2022-09-28 ENCOUNTER — Other Ambulatory Visit: Payer: Self-pay

## 2022-09-28 ENCOUNTER — Emergency Department (HOSPITAL_BASED_OUTPATIENT_CLINIC_OR_DEPARTMENT_OTHER): Payer: Medicaid Other | Admitting: Radiology

## 2022-09-28 ENCOUNTER — Encounter (HOSPITAL_BASED_OUTPATIENT_CLINIC_OR_DEPARTMENT_OTHER): Payer: Self-pay | Admitting: Emergency Medicine

## 2022-09-28 ENCOUNTER — Encounter (HOSPITAL_BASED_OUTPATIENT_CLINIC_OR_DEPARTMENT_OTHER): Payer: Medicaid Other | Admitting: Orthopaedic Surgery

## 2022-09-28 ENCOUNTER — Emergency Department (HOSPITAL_BASED_OUTPATIENT_CLINIC_OR_DEPARTMENT_OTHER)
Admission: EM | Admit: 2022-09-28 | Discharge: 2022-09-28 | Disposition: A | Discharge status: Home / Self Care | Payer: Medicaid Other | Attending: Emergency Medicine | Admitting: Emergency Medicine

## 2022-09-28 DIAGNOSIS — M79641 Pain in right hand: Secondary | ICD-10-CM | POA: Insufficient documentation

## 2022-09-28 DIAGNOSIS — J45909 Unspecified asthma, uncomplicated: Secondary | ICD-10-CM | POA: Insufficient documentation

## 2022-09-28 MED ORDER — HYDROCODONE-ACETAMINOPHEN 5-325 MG PO TABS: 1.0000 | ORAL_TABLET | Freq: Four times a day (QID) | ORAL | 0 refills | Status: AC | PRN

## 2022-09-28 MED ORDER — HYDROCODONE-ACETAMINOPHEN 5-325 MG PO TABS
1.0000 | ORAL_TABLET | Freq: Once | ORAL | Status: AC
  Administered 2022-09-28: 1 via ORAL
  Filled 2022-09-28: qty 1

## 2022-09-28 NOTE — ED Provider Notes (Signed)
[place] EMERGENCY DEPARTMENT AT [facility] Provider Note   CSN: [id number] Arrival date & time: 09/28/22  6433     History  Chief Complaint  Patient presents with   Hand Problem    Martin Cooper is a 37 y.o. male.  Patient with past medical history of asthma, learning disability, seizure disorder presents today with complaints of right hand pain. He states that he had surgery with Dr. Steward Drone on 6/10 to remove a plate and screws in his hand that was causing him pain. He states that since surgery he has had pain and swelling in the hand that is concerning to him. He denies any fevers or chills. He has not changed his dressings but has not noted any drainage. He is not able to fully range his hand due to pain.   The history is provided by the patient. No language interpreter was used.      Home Medications Prior to Admission medications   Medication Sig Start Date End Date Taking? Authorizing Provider  amoxicillin-clavulanate (AUGMENTIN) 875-125 MG tablet Take 1 tablet by mouth 2 (two) times daily. Patient not taking: Reported on 09/01/2022 06/28/22   Lula Olszewski, MD  dicyclomine (BENTYL) 20 MG tablet Take 1 tablet (20 mg total) by mouth 2 (two) times daily for 5 days. 09/01/22 09/24/22  Gowens, Mariah L, PA-C  ketorolac (TORADOL) 10 MG tablet Take 1 tablet (10 mg total) by mouth every 6 (six) hours as needed. 06/28/22   Lula Olszewski, MD  levETIRAcetam (KEPPRA) 1000 MG tablet Take 1 tablet (1,000 mg total) by mouth 2 (two) times daily. 08/08/22   Van Clines, MD  phenytoin (DILANTIN) 100 MG ER capsule Take 3 capsules (300 mg total) by mouth at bedtime. 08/08/22 09/24/22  Van Clines, MD  Saline (SIMPLY SALINE) 0.9 % AERS Place 2 each into the nose daily as needed. 06/28/22   Lula Olszewski, MD      Allergies    Patient has no known allergies.    Review of Systems   Review of Systems  Musculoskeletal:  Positive for arthralgias and myalgias.  All other  systems reviewed and are negative.   Physical Exam Updated Vital Signs BP (!) 123/99 (BP Location: Left Arm)   Pulse (!) 54   Temp 97.9 F (36.6 C) (Oral)   Resp 16   Ht 5\' 8"  (1.727 m)   Wt 81.6 kg   SpO2 98%   BMI 27.37 kg/m  Physical Exam Vitals and nursing note reviewed.  Constitutional:      General: He is not in acute distress.    Appearance: Normal appearance. He is normal weight. He is not ill-appearing, toxic-appearing or diaphoretic.  HENT:     Head: Normocephalic and atraumatic.  Cardiovascular:     Rate and Rhythm: Normal rate.  Pulmonary:     Effort: Pulmonary effort is normal. No respiratory distress.  Musculoskeletal:        General: Normal range of motion.     Cervical back: Normal range of motion.     Comments: Dorsal aspect of the right hand with sutures in place. No erythema, warmth, fluctuance, induration, or drainage. Trace amounts of swelling and bruising noted. Pain with extension which is limited, able to passively extend. Full flexion intact. Capillary refill less than 2 seconds.  Skin:    General: Skin is warm and dry.  Neurological:     General: No focal deficit present.     Mental Status:  He is alert.  Psychiatric:        Mood and Affect: Mood normal.        Behavior: Behavior normal.     ED Results / Procedures / Treatments   Labs (all labs ordered are listed, but only abnormal results are displayed) Labs Reviewed - No data to display  EKG None  Radiology DG Hand Complete Right  Result Date: 09/28/2022 CLINICAL DATA:  Right hand pain and swelling since surgery 09/21/2022 to have hardware removed. EXAM: RIGHT HAND - COMPLETE 3+ VIEW COMPARISON:  Right hand radiographs 08/31/2022 FINDINGS: Normal bone mineralization. Interval removal of dorsal plate and screw hardware from the fifth metacarpal proximal metaphysis and the shaft. The third most proximal screw shaft remains in the bone and the other screws have been removed with associated  screw tract lucencies seen. Old healed fracture of fifth metacarpal with mild foreshortening. Neutral ulnar variance. No acute fracture or dislocation. No significant osteoarthritis. IMPRESSION: Interval removal of dorsal plate and screw hardware from the fifth metacarpal proximal metaphysis and shaft. The third most proximal screw shaft remains in the bone but the rest of the hardware has been removed. Electronically Signed   By: Neita Garnet M.D.   On: 09/28/2022 10:21    Procedures Procedures    Medications Ordered in ED Medications - No data to display  ED Course/ Medical Decision Making/ A&P                             Medical Decision Making Amount and/or Complexity of Data Reviewed Radiology: ordered.   Patient presents today with complaints of hand problem after surgery. He is afebrile, non-toxic appearing, and in no acute distress with reassuring vital signs. Physical exam reveals well healing surgical site with sutures still in place. No signs of infection or drainage. ROM somewhat limited due to pain, however passive ROM intact. X-ray imaging resulted and reveals  Interval removal of dorsal plate and screw hardware from the fifth metacarpal proximal metaphysis and shaft. The third most proximal screw shaft remains in the bone but the rest of the hardware has been removed.  I have personally reviewed and interpreted this imaging and agree with radiology interpretation.  Discussed patient with his surgeon Dr. Steward Drone who recommends narcotic pain control and close outpatient follow-up. Per his request, I have also informed the patient that Dr. Steward Drone accepts same day walk ins and his office is right upstairs. Discussed with family who are understanding. Patient given norco with improvement, will send rx for same. PDMP reviewed. Patient advised not to drive or operate heavy machinery while taking this medication. Also recommend RICE and tylenol/ibuprofen. Evaluation and diagnostic  testing in the emergency department does not suggest an emergent condition requiring admission or immediate intervention beyond what has been performed at this time.  Plan for discharge with close PCP follow-up.  Patient is understanding and amenable with plan, educated on red flag symptoms that would prompt immediate return.  Patient discharged in stable condition.  Final Clinical Impression(s) / ED Diagnoses Final diagnoses:  Hand pain, right    Rx / DC Orders ED Discharge Orders          Ordered    HYDROcodone-acetaminophen (NORCO/VICODIN) 5-325 MG tablet  Every 6 hours PRN        09/28/22 1146          An After Visit Summary was printed and given to the patient.  Vear Clock 09/28/22 1149    Jacalyn Lefevre, MD 09/28/22 1326

## 2022-09-28 NOTE — Discharge Instructions (Addendum)
As we discussed, your workup in the ER today was reassuring for acute findings.  X-ray imaging of your hand did not reveal any emergent concerns.  I have talked to your surgeon Dr. Serena Croissant office is right upstairs and he has informed me that he accept same-day walk-ins if you want to go to their office they will see you. I have also given you a prescription for a few narcotic pain medication.  Take as prescribed as needed for severe pain only.  Do not drive or operate heavy machinery while taking this medication as it can be sedating.  You may also take Tylenol/ibuprofen with this.  I also recommend that he rest, ice, compress, and elevate your hand.  Return if development of any new or worsening symptoms.

## 2022-09-28 NOTE — ED Notes (Signed)
Discharge paperwork given and verbally understood. 

## 2022-09-28 NOTE — ED Triage Notes (Signed)
Pt arrives to ED with c/o right hand pain and swelling since surgery ton 6/10 to have hardware removed.

## 2022-10-01 ENCOUNTER — Encounter: Payer: Self-pay | Admitting: Internal Medicine

## 2022-10-01 ENCOUNTER — Ambulatory Visit (INDEPENDENT_AMBULATORY_CARE_PROVIDER_SITE_OTHER): Payer: Medicaid Other | Admitting: Internal Medicine

## 2022-10-01 VITALS — BP 112/86 | HR 74 | Temp 98.0°F | Ht 68.0 in | Wt 177.4 lb

## 2022-10-01 DIAGNOSIS — R82998 Other abnormal findings in urine: Secondary | ICD-10-CM

## 2022-10-01 DIAGNOSIS — F32A Depression, unspecified: Secondary | ICD-10-CM | POA: Diagnosis not present

## 2022-10-01 DIAGNOSIS — M79641 Pain in right hand: Secondary | ICD-10-CM

## 2022-10-01 DIAGNOSIS — G40909 Epilepsy, unspecified, not intractable, without status epilepticus: Secondary | ICD-10-CM | POA: Diagnosis not present

## 2022-10-01 DIAGNOSIS — F1994 Other psychoactive substance use, unspecified with psychoactive substance-induced mood disorder: Secondary | ICD-10-CM

## 2022-10-01 DIAGNOSIS — J4520 Mild intermittent asthma, uncomplicated: Secondary | ICD-10-CM | POA: Diagnosis not present

## 2022-10-01 DIAGNOSIS — R310 Gross hematuria: Secondary | ICD-10-CM

## 2022-10-01 DIAGNOSIS — F1429 Cocaine dependence with unspecified cocaine-induced disorder: Secondary | ICD-10-CM

## 2022-10-01 DIAGNOSIS — R300 Dysuria: Secondary | ICD-10-CM

## 2022-10-01 MED ORDER — ALBUTEROL SULFATE HFA 108 (90 BASE) MCG/ACT IN AERS
2.0000 | INHALATION_SPRAY | Freq: Four times a day (QID) | RESPIRATORY_TRACT | 2 refills | Status: AC | PRN
Start: 2022-10-01 — End: ?

## 2022-10-01 NOTE — Assessment & Plan Note (Signed)
Post-operative hand surgery: He underwent surgery on 09/24/2022 to remove metal from his hand and are experiencing no significant pain, though the stitches are causing discomfort. He visited the ER due to concern for discoloration, but x-ray findings were not significant. We will continue with the scheduled surgical follow-up on 10/05/2022 and change the outer bandage as needed for comfort.

## 2022-10-01 NOTE — Assessment & Plan Note (Signed)
Encouraged patient to complete 24 hour urine collelction and drink plenty of fluids.

## 2022-10-01 NOTE — Assessment & Plan Note (Signed)
Seizure disorder: He report no recent seizures. We will continue the current medication regimen.

## 2022-10-01 NOTE — Patient Instructions (Addendum)
VISIT SUMMARY:  During your recent visit, we discussed your post-operative hand surgery, intermittent hematuria, substance use, asthma, and seizure disorder. You had surgery to remove metal from your hand and are experiencing discomfort from the stitches but no significant pain. You also reported intermittent blood in your urine, likely due to calcium oxalate stones. You have acknowledged using cocaine and expressed an interest in quitting. You have no recent asthma flares and no recent seizures.  YOUR PLAN:  -POST-OPERATIVE HAND SURGERY: You had surgery to remove metal from your hand. The stitches are causing some discomfort, but there is no significant pain. We will continue with the scheduled surgical follow-up and change the outer bandage as needed for comfort.  -HEMATURIA: You have been seeing blood in your urine, likely due to calcium oxalate stones. We will continue with the scheduled urology appointment for a bladder check. You are encouraged to drink more fluids, especially water, to prevent stone formation and to collect a 24-hour urine sample to assess calcium levels.  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 We provided information on potential treatment options and the risks associated with continued use. You have been referred to a therapist for management of reported depression and support in substance use cessation.  Get an omron blood pressure cuff and check your blood pressure if using [facility] out [facility]. They will call about behavioral health counselor referral   Illegal Substances of abuse that are safer than cocaine but not safe with cocaine so not medically used for detox.  Stimulant Medications:  Adderall: A stimulant and controlled substance. Amphetamine: A stimulant and controlled substance. Methylphenidate: A stimulant and controlled substance. Brand names include Concerta. Vyvanse: A stimulant and  controlled substance. Dexedrine: A stimulant and controlled substance. Desoxyn: A stimulant and controlled substance. Focalin: A stimulant and controlled substance.   -ASTHMA: You have no recent asthma flares. We will prescribe an inhaler to be picked up at [facility].  -SEIZURE DISORDER: You have no recent seizures. We will continue the current medication regimen.  INSTRUCTIONS:  Please continue with the scheduled surgical follow-up on 10/05/2022 and change the outer bandage as needed for comfort. Also, continue with the scheduled urology appointment on 10/02/2022 for a bladder check. Increase your fluid intake, especially water, to prevent stone formation and collect a 24-hour urine sample to assess calcium levels. You have been referred to a therapist for management of reported depression and support in substance use cessation. An inhaler will be prescribed for your asthma, to be picked up at [facility]. Continue the current medication regimen for your seizure disorder. An open invitation for frequent check-ins to monitor health and support substance use cessation has been extended. The next appointment is to be scheduled at your discretion.
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
## 2022-10-01 NOTE — Assessment & Plan Note (Addendum)
referred to a therapist for management of reported depression and support in substance use cessation. Information on the local recovery center, [facility], was also provided for future consideration. Follow-up: An open invitation for frequent check-ins to monitor health and support substance use cessation has been extended. The next appointment is to be scheduled at his discretion.

## 2022-10-01 NOTE — Assessment & Plan Note (Signed)
We will prescribe an inhaler to be picked up at [facility].

## 2022-10-02 ENCOUNTER — Ambulatory Visit
Admission: RE | Admit: 2022-10-02 | Discharge: 2022-10-02 | Disposition: A | Discharge status: Home / Self Care | Payer: Medicaid Other | Source: Ambulatory Visit | Attending: Internal Medicine | Admitting: Internal Medicine

## 2022-10-02 ENCOUNTER — Other Ambulatory Visit: Payer: Self-pay

## 2022-10-02 DIAGNOSIS — R82998 Other abnormal findings in urine: Secondary | ICD-10-CM

## 2022-10-02 DIAGNOSIS — R109 Unspecified abdominal pain: Secondary | ICD-10-CM

## 2022-10-02 DIAGNOSIS — R310 Gross hematuria: Secondary | ICD-10-CM

## 2022-10-04 LAB — OXALATE, QUANT, 24-HOUR URINE
Oxalates, Urine 24hr: 18 mg/24 hr (ref 7–44)
Oxalates, Urine: 13 mg/L

## 2022-10-05 ENCOUNTER — Ambulatory Visit (INDEPENDENT_AMBULATORY_CARE_PROVIDER_SITE_OTHER): Payer: Medicaid Other | Admitting: Orthopaedic Surgery

## 2022-10-05 DIAGNOSIS — T85848A Pain due to other internal prosthetic devices, implants and grafts, initial encounter: Secondary | ICD-10-CM
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
## 2022-10-07 NOTE — Progress Notes (Signed)
Your recent 24-hour urine oxalate test showed a level of 18 mg/24 hr, which is within the normal range (7-44 mg/24 hr). This result suggests that your body's oxalate levels are currently within normal limits, which is reassuring. However, if you have ongoing symptoms or concerns, further evaluation may be necessary. Please continue to follow any dietary recommendations provided and keep your follow-up appointments as scheduled to monitor your condition effectively.

## 2022-10-13 ENCOUNTER — Other Ambulatory Visit: Payer: Self-pay

## 2022-10-13 ENCOUNTER — Emergency Department (HOSPITAL_COMMUNITY)
Admission: EM | Admit: 2022-10-13 | Discharge: 2022-10-13 | Disposition: A | Discharge status: Home / Self Care | Payer: Medicaid Other | Attending: Emergency Medicine | Admitting: Emergency Medicine

## 2022-10-13 ENCOUNTER — Encounter (HOSPITAL_COMMUNITY): Payer: Self-pay

## 2022-10-13 DIAGNOSIS — R519 Headache, unspecified: Secondary | ICD-10-CM | POA: Diagnosis not present

## 2022-10-13 DIAGNOSIS — R569 Unspecified convulsions: Secondary | ICD-10-CM | POA: Insufficient documentation

## 2022-10-13 DIAGNOSIS — R251 Tremor, unspecified: Secondary | ICD-10-CM

## 2022-10-13 DIAGNOSIS — Z91148 Patient's other noncompliance with medication regimen for other reason: Secondary | ICD-10-CM | POA: Diagnosis not present

## 2022-10-13 DIAGNOSIS — G40909 Epilepsy, unspecified, not intractable, without status epilepticus: Secondary | ICD-10-CM

## 2022-10-13 LAB — CBC
HCT: 39.4 % (ref 39.0–52.0)
Hemoglobin: 13 g/dL (ref 13.0–17.0)
MCH: 31.3 pg (ref 26.0–34.0)
MCHC: 33 g/dL (ref 30.0–36.0)
MCV: 94.7 fL (ref 80.0–100.0)
Platelets: 251 10*3/uL (ref 150–400)
RBC: 4.16 MIL/uL — ABNORMAL LOW (ref 4.22–5.81)
RDW: 12.2 % (ref 11.5–15.5)
WBC: 8.6 10*3/uL (ref 4.0–10.5)
nRBC: 0 % (ref 0.0–0.2)

## 2022-10-13 LAB — RAPID URINE DRUG SCREEN, HOSP PERFORMED
Amphetamines: NOT DETECTED
Barbiturates: NOT DETECTED
Benzodiazepines: NOT DETECTED
Cocaine: POSITIVE — AB
Opiates: NOT DETECTED
Tetrahydrocannabinol: POSITIVE — AB

## 2022-10-13 LAB — COMPREHENSIVE METABOLIC PANEL
ALT: 16 U/L (ref 0–44)
AST: 24 U/L (ref 15–41)
Albumin: 4.1 g/dL (ref 3.5–5.0)
Alkaline Phosphatase: 57 U/L (ref 38–126)
Anion gap: 7 (ref 5–15)
BUN: 7 mg/dL (ref 6–20)
CO2: 27 mmol/L (ref 22–32)
Calcium: 9.4 mg/dL (ref 8.9–10.3)
Chloride: 108 mmol/L (ref 98–111)
Creatinine, Ser: 1.16 mg/dL (ref 0.61–1.24)
GFR, Estimated: >60 mL/min (ref >60)
Glucose, Bld: 115 mg/dL — ABNORMAL HIGH (ref 70–99)
Potassium: 3.6 mmol/L (ref 3.5–5.1)
Sodium: 142 mmol/L (ref 135–145)
Total Bilirubin: 0.8 mg/dL (ref 0.3–1.2)
Total Protein: 7.2 g/dL (ref 6.5–8.1)

## 2022-10-13 LAB — PHENYTOIN LEVEL, TOTAL: Phenytoin Lvl: <2.5 ug/mL — ABNORMAL LOW (ref 10.0–20.0)

## 2022-10-13 LAB — ETHANOL: Alcohol, Ethyl (B): <10 mg/dL (ref <10)

## 2022-10-13 MED ORDER — LEVETIRACETAM IN NACL 1500 MG/100ML IV SOLN
1500.0000 mg | Freq: Once | INTRAVENOUS | Status: AC
  Administered 2022-10-13: 1500 mg via INTRAVENOUS
  Filled 2022-10-13: qty 100

## 2022-10-13 MED ORDER — LEVETIRACETAM 1000 MG PO TABS
1000.0000 mg | ORAL_TABLET | Freq: Two times a day (BID) | ORAL | 11 refills | Status: AC
Start: 2022-10-13 — End: ?

## 2022-10-13 MED ORDER — PHENYTOIN SODIUM EXTENDED 100 MG PO CAPS
300.0000 mg | ORAL_CAPSULE | Freq: Every day | ORAL | 11 refills | Status: AC
Start: 2022-10-13 — End: 2022-11-12

## 2022-10-13 MED ORDER — SODIUM CHLORIDE 0.9 % IV SOLN
1000.0000 mg | Freq: Once | INTRAVENOUS | Status: AC
  Administered 2022-10-13: 1000 mg via INTRAVENOUS
  Filled 2022-10-13: qty 20

## 2022-10-13 NOTE — ED Provider Notes (Signed)
[place] EMERGENCY DEPARTMENT AT [facility] Provider Note   CSN: [id number] Arrival date & time: 10/13/22  5638     History  Chief Complaint  Patient presents with   Tremors    Martin Cooper is a 37 y.o. male.  HPI Patient was shaking on his right side.  States woke up with this morning.  History of seizures.  Appears to have potentially focal done tonic-clonic seizures.  Also has a headache which he gets with the seizures.  Thinks he may have had a seizure last night.  Has been noncompliant with his Keppra and Depakote.  States he has not been taking it because he is been depressed.  I reviewed neurology office visit.  Also potentially has had some nonepileptic seizures along the way.  Patient states his right side is shaking now.    Home Medications Prior to Admission medications   Medication Sig Start Date End Date Taking? Authorizing Provider  albuterol (VENTOLIN HFA) 108 (90 Base) MCG/ACT inhaler Inhale 2 puffs into the lungs every 6 (six) hours as needed for wheezing or shortness of breath. 10/01/22   Lula Olszewski, MD  amoxicillin-clavulanate (AUGMENTIN) 875-125 MG tablet Take 1 tablet by mouth 2 (two) times daily. Patient not taking: Reported on 10/01/2022 06/28/22   Lula Olszewski, MD  dicyclomine (BENTYL) 20 MG tablet Take 1 tablet (20 mg total) by mouth 2 (two) times daily for 5 days. 09/01/22 09/24/22  Gowens, Lawrence [place], PA-C  HYDROcodone-acetaminophen (NORCO/VICODIN) 5-325 MG tablet Take 1 tablet by mouth every 6 (six) hours as needed for severe pain. 09/28/22   Smoot, Shawn Route, PA-C  ketorolac (TORADOL) 10 MG tablet Take 1 tablet (10 mg total) by mouth every 6 (six) hours as needed. 06/28/22   Lula Olszewski, MD  levETIRAcetam (KEPPRA) 1000 MG tablet Take 1 tablet (1,000 mg total) by mouth 2 (two) times daily. 10/13/22   Benjiman Core, MD  phenytoin (DILANTIN) 100 MG ER capsule Take 3 capsules (300 mg total) by mouth at bedtime. 10/13/22 11/12/22   Benjiman Core, MD  Saline (SIMPLY SALINE) 0.9 % AERS Place 2 each into the nose daily as needed. 06/28/22   Lula Olszewski, MD      Allergies    Patient has no known allergies.    Review of Systems   Review of Systems  Physical Exam Updated Vital Signs BP 132/82   Pulse 70   Temp 99.3 F (37.4 C) (Oral)   Resp 20   Ht 5\' 8"  (1.727 m)   Wt 81.6 kg   SpO2 100%   BMI 27.37 kg/m  Physical Exam Vitals and nursing note reviewed.  Constitutional:      Appearance: Normal appearance.  HENT:     Head: Atraumatic.  Eyes:     Extraocular Movements: Extraocular movements intact.  Abdominal:     Tenderness: There is no abdominal tenderness.  Neurological:     Mental Status: Mental status is at baseline.     Comments: Tremor/shaking of right upper and lower extremity however does stop at times and does go away with use of the extremity.  Awake and appropriate otherwise.     ED Results / Procedures / Treatments   Labs (all labs ordered are listed, but only abnormal results are displayed) Labs Reviewed  PHENYTOIN LEVEL, TOTAL - Abnormal; Notable for the following components:      Result Value   Phenytoin Lvl <2.5 (*)    All other components within  normal limits  RAPID URINE DRUG SCREEN, HOSP PERFORMED - Abnormal; Notable for the following components:   Cocaine POSITIVE (*)    Tetrahydrocannabinol POSITIVE (*)    All other components within normal limits  COMPREHENSIVE METABOLIC PANEL - Abnormal; Notable for the following components:   Glucose, Bld 115 (*)    All other components within normal limits  CBC - Abnormal; Notable for the following components:   RBC 4.16 (*)    All other components within normal limits  ETHANOL    EKG None  Radiology No results found.  Procedures Procedures    Medications Ordered in ED Medications  levETIRAcetam (KEPPRA) IVPB 1500 mg/ 100 mL premix (0 mg Intravenous Stopped 10/13/22 1010)  phenytoin (DILANTIN) 1,000 mg in sodium  chloride 0.9 % 250 mL IVPB (0 mg Intravenous Stopped 10/13/22 1111)    ED Course/ Medical Decision Making/ A&P                             Medical Decision Making Amount and/or Complexity of Data Reviewed Labs: ordered.  Risk Prescription drug management.   Patient with tremor on right side.  History of seizures.  Potentially could be partial seizure but exam not necessarily consistent with this.  Does suppress and will even go away at times.  However has been noncompliant with his seizure medicines.  Potentially also could have had a seizure last night.  It appears he has not followed up for his EEG from neurology.  Will load with Keppra.  Will give Dilantin.  Will get Keppra level.  Dilantin level undetectable.  Blood work otherwise reassuring.  Urinalysis does show cocaine and marijuana.  Does have history of same.  Tremor decreased after medications.  Does have some depression but not suicidal.  Resources given for follow-up.  Instructed on need for compliance of medication.  Discharged home.         Final Clinical Impression(s) / ED Diagnoses Final diagnoses:  Tremor  Noncompliance w/medication treatment due to intermit use of medication    Rx / DC Orders ED Discharge Orders          Ordered    levETIRAcetam (KEPPRA) 1000 MG tablet  2 times daily        10/13/22 1329    phenytoin (DILANTIN) 100 MG ER capsule  Daily at bedtime        10/13/22 1329              Benjiman Core, MD 10/13/22 1539

## 2022-10-13 NOTE — Discharge Instructions (Signed)
Follow-up with your neurologist.  You can follow-up with behavioral health urgent care for the depression.  Take your medicine consistently.

## 2022-10-13 NOTE — ED Triage Notes (Signed)
Pt bib ems from home c/o tremors. Pt has hx seizures and doesn't take medication regularly. Pt said headaches are the indicator a seizure is coming.    BP 184/110 HR 84 RA 98% CBG 122

## 2022-10-22 ENCOUNTER — Ambulatory Visit: Payer: Medicaid Other | Admitting: Internal Medicine

## 2022-10-31 ENCOUNTER — Ambulatory Visit: Payer: Medicaid Other | Admitting: Internal Medicine

## 2022-12-11 ENCOUNTER — Ambulatory Visit: Payer: Medicaid Other | Admitting: Neurology

## 2022-12-11 ENCOUNTER — Encounter: Payer: Self-pay | Admitting: Neurology

## 2023-06-11 ENCOUNTER — Telehealth: Payer: Self-pay

## 2023-06-11 NOTE — Transitions of Care (Post Inpatient/ED Visit) (Signed)
   06/11/2023  Name: Martin Cooper MRN: [id number] DOB: 01-17-1986  Today's TOC FU Call Status: Today's TOC FU Call Status:: Unsuccessful Call (1st Attempt) Unsuccessful Call (1st Attempt) Date: 06/11/23  Attempted to reach the patient regarding the most recent Inpatient/ED visit.  Follow Up Plan: Additional outreach attempts will be made to reach the patient to complete the Transitions of Care (Post Inpatient/ED visit) call.   Signature Karena Addison, LPN [facility] Nurse Health Advisor Direct Dial [phone]

## 2023-06-13 NOTE — Transitions of Care (Post Inpatient/ED Visit) (Signed)
   06/13/2023  Name: Martin Cooper MRN: [id number] DOB: 09-04-1985  Today's TOC FU Call Status: Today's TOC FU Call Status:: Unsuccessful Call (2nd Attempt) Unsuccessful Call (1st Attempt) Date: 06/11/23 Unsuccessful Call (2nd Attempt) Date: 06/13/23  Attempted to reach the patient regarding the most recent Inpatient/ED visit.  Follow Up Plan: Additional outreach attempts will be made to reach the patient to complete the Transitions of Care (Post Inpatient/ED visit) call.   Signature Karena Addison, LPN [facility] Nurse Health Advisor Direct Dial [phone]

## 2023-06-17 NOTE — Transitions of Care (Post Inpatient/ED Visit) (Signed)
   06/17/2023  Name: Martin Cooper MRN: [id number] DOB: Oct 03, 1985  Today's TOC FU Call Status: Today's TOC FU Call Status:: Unsuccessful Call (3rd Attempt) Unsuccessful Call (1st Attempt) Date: 06/11/23 Unsuccessful Call (2nd Attempt) Date: 06/13/23 Unsuccessful Call (3rd Attempt) Date: 06/17/23  Attempted to reach the patient regarding the most recent Inpatient/ED visit.  Follow Up Plan: No further outreach attempts will be made at this time. We have been unable to contact the patient.  Signature Karena Addison, LPN [facility] Nurse Health Advisor Direct Dial [phone]

## 2023-10-28 ENCOUNTER — Telehealth: Payer: Self-pay

## 2023-10-28 NOTE — Transitions of Care (Post Inpatient/ED Visit) (Signed)
   10/28/2023  Name: Martin Cooper MRN: [id number] DOB: [birth year]  Today's TOC FU Call Status: Today's TOC FU Call Status:: Unsuccessful Call (1st Attempt) Unsuccessful Call (1st Attempt) Date: 10/28/23  Attempted to reach the patient regarding the most recent Inpatient/ED visit.  Follow Up Plan: Additional outreach attempts will be made to reach the patient to complete the Transitions of Care (Post Inpatient/ED visit) call.   Signature  Xoe Hoe, LPN [facility] AWV Team Direct Dial: [phone]

## 2023-10-29 ENCOUNTER — Telehealth: Payer: Self-pay

## 2023-10-29 NOTE — Transitions of Care (Post Inpatient/ED Visit) (Unsigned)
   10/29/2023  Name: Martin Cooper MRN: [id number] DOB: 12-Aug-1985  Today's TOC FU Call Status: Today's TOC FU Call Status:: Unsuccessful Call (2nd Attempt) Unsuccessful Call (2nd Attempt) Date: 10/29/23  Attempted to reach the patient regarding the most recent Inpatient/ED visit.  Follow Up Plan: Additional outreach attempts will be made to reach the patient to complete the Transitions of Care (Post Inpatient/ED visit) call.   Signature Julian Lemmings, LPN [facility] Nurse Health Advisor Direct Dial [phone]

## 2023-10-30 NOTE — Transitions of Care (Post Inpatient/ED Visit) (Signed)
   10/30/2023  Name: Martin Cooper MRN: [id number] DOB: [birth year]  Today's TOC FU Call Status: Today's TOC FU Call Status:: Unsuccessful Call (3rd Attempt) Unsuccessful Call (2nd Attempt) Date: 10/29/23 Unsuccessful Call (3rd Attempt) Date: 10/30/23  Attempted to reach the patient regarding the most recent Inpatient/ED visit.  Follow Up Plan: No further outreach attempts will be made at this time. We have been unable to contact the patient.  Signature Julian Lemmings, LPN [facility] Nurse Health Advisor Direct Dial [phone]

## 2023-11-11 ENCOUNTER — Telehealth: Payer: Self-pay

## 2023-11-11 NOTE — Transitions of Care (Post Inpatient/ED Visit) (Unsigned)
   11/11/2023  Name: Martin Cooper MRN: [id number] DOB: 03-23-1986  Today's TOC FU Call Status: Today's TOC FU Call Status:: Unsuccessful Call (1st Attempt) Unsuccessful Call (1st Attempt) Date: 11/11/23  Attempted to reach the patient regarding the most recent Inpatient/ED visit.  Follow Up Plan: Additional outreach attempts will be made to reach the patient to complete the Transitions of Care (Post Inpatient/ED visit) call.   Signature Julian Lemmings, LPN [facility] Nurse Health Advisor Direct Dial [phone]

## 2023-11-12 NOTE — Transitions of Care (Post Inpatient/ED Visit) (Unsigned)
   11/12/2023  Name: Martin Cooper MRN: [id number] DOB: 01/19/1986  Today's TOC FU Call Status: Today's TOC FU Call Status:: Unsuccessful Call (2nd Attempt) Unsuccessful Call (1st Attempt) Date: 11/11/23 Unsuccessful Call (2nd Attempt) Date: 11/12/23  Attempted to reach the patient regarding the most recent Inpatient/ED visit.  Follow Up Plan: Additional outreach attempts will be made to reach the patient to complete the Transitions of Care (Post Inpatient/ED visit) call.   Signature Julian Lemmings, LPN [facility] Nurse Health Advisor Direct Dial [phone]

## 2023-11-13 NOTE — Transitions of Care (Post Inpatient/ED Visit) (Signed)
   11/13/2023  Name: Martin Cooper MRN: [id number] DOB: Apr 18, 1985  Today's TOC FU Call Status: Today's TOC FU Call Status:: Unsuccessful Call (3rd Attempt) Unsuccessful Call (1st Attempt) Date: 11/11/23 Unsuccessful Call (2nd Attempt) Date: 11/12/23 Unsuccessful Call (3rd Attempt) Date: 11/13/23  Attempted to reach the patient regarding the most recent Inpatient/ED visit.  Follow Up Plan: No further outreach attempts will be made at this time. We have been unable to contact the patient.  Signature Julian Lemmings, LPN [facility] Nurse Health Advisor Direct Dial [phone]

## 2023-11-15 ENCOUNTER — Telehealth: Payer: Self-pay

## 2023-11-15 NOTE — Transitions of Care (Post Inpatient/ED Visit) (Signed)
   11/15/2023  Name: Martin Cooper MRN: [id number] DOB: May 15, 1985  Today's TOC FU Call Status: Unsuccessful Call (1st Attempt) Date: 11/15/23  Attempted to reach the patient regarding the most recent Inpatient/ED visit.  Follow Up Plan: Additional outreach attempts will be made to reach the patient to complete the Transitions of Care (Post Inpatient/ED visit) call.   Signature Julian Lemmings, LPN [facility] Nurse Health Advisor Direct Dial [phone]

## 2023-11-18 NOTE — Transitions of Care (Post Inpatient/ED Visit) (Unsigned)
   11/18/2023  Name: BAYLER GEHRIG MRN: [id number] DOB: [birth year]  Today's TOC FU Call Status: Today's TOC FU Call Status:: Unsuccessful Call (2nd Attempt) Unsuccessful Call (1st Attempt) Date: 11/15/23 Unsuccessful Call (2nd Attempt) Date: 11/18/23  Attempted to reach the patient regarding the most recent Inpatient/ED visit.  Follow Up Plan: Additional outreach attempts will be made to reach the patient to complete the Transitions of Care (Post Inpatient/ED visit) call.   Signature Julian Lemmings, LPN [facility] Nurse Health Advisor Direct Dial [phone]

## 2023-11-19 NOTE — Transitions of Care (Post Inpatient/ED Visit) (Signed)
   11/19/2023  Name: Martin Cooper MRN: [id number] DOB: 12-02-1985  Today's TOC FU Call Status: Today's TOC FU Call Status:: Unsuccessful Call (3rd Attempt) Unsuccessful Call (1st Attempt) Date: 11/15/23 Unsuccessful Call (2nd Attempt) Date: 11/18/23 Unsuccessful Call (3rd Attempt) Date: 11/19/23  Attempted to reach the patient regarding the most recent Inpatient/ED visit.  Follow Up Plan: No further outreach attempts will be made at this time. We have been unable to contact the patient.  Signature Julian Lemmings, LPN [facility] Nurse Health Advisor Direct Dial [phone]

## 2024-02-10 ENCOUNTER — Telehealth: Payer: Self-pay

## 2024-02-10 NOTE — Transitions of Care (Post Inpatient/ED Visit) (Unsigned)
   02/10/2024  Name: Martin Cooper MRN: [id number] DOB: 15-Sep-1985  Today's TOC FU Call Status: Today's TOC FU Call Status:: Unsuccessful Call (1st Attempt) Unsuccessful Call (1st Attempt) Date: 02/10/24  Attempted to reach the patient regarding the most recent Inpatient/ED visit.  Follow Up Plan: Additional outreach attempts will be made to reach the patient to complete the Transitions of Care (Post Inpatient/ED visit) call.   Signature Julian Lemmings, LPN [facility] Nurse Health Advisor Direct Dial [phone]

## 2024-02-11 NOTE — Transitions of Care (Post Inpatient/ED Visit) (Unsigned)
   02/11/2024  Name: Martin Cooper MRN: [id number] DOB: 05/26/1985  Today's TOC FU Call Status: Today's TOC FU Call Status:: Unsuccessful Call (2nd Attempt) Unsuccessful Call (1st Attempt) Date: 02/10/24 Unsuccessful Call (2nd Attempt) Date: 02/11/24  Attempted to reach the patient regarding the most recent Inpatient/ED visit.  Follow Up Plan: Additional outreach attempts will be made to reach the patient to complete the Transitions of Care (Post Inpatient/ED visit) call.   Signature Julian Lemmings, LPN [facility] Nurse Health Advisor Direct Dial [phone]

## 2024-02-12 NOTE — Transitions of Care (Post Inpatient/ED Visit) (Signed)
   02/12/2024  Name: FRANCHOT POLLITT MRN: [id number] DOB: 1986/02/01  Today's TOC FU Call Status: Today's TOC FU Call Status:: Unsuccessful Call (3rd Attempt) Unsuccessful Call (1st Attempt) Date: 02/10/24 Unsuccessful Call (2nd Attempt) Date: 02/11/24 Unsuccessful Call (3rd Attempt) Date: 02/12/24  Attempted to reach the patient regarding the most recent Inpatient/ED visit.  Follow Up Plan: No further outreach attempts will be made at this time. We have been unable to contact the patient.  Signature Julian Lemmings, LPN [facility] Nurse Health Advisor Direct Dial [phone]
